# Patient Record
Sex: Male | Born: 1950
Health system: Southern US, Community
[De-identification: ages and names within clinical notes are randomized; demographics above are authoritative.]

## PROBLEM LIST (undated history)

## (undated) DIAGNOSIS — F22 Delusional disorders: Secondary | ICD-10-CM

## (undated) DIAGNOSIS — G4700 Insomnia, unspecified: Secondary | ICD-10-CM

## (undated) DIAGNOSIS — Z860100 Personal history of colon polyps, unspecified: Secondary | ICD-10-CM

## (undated) DIAGNOSIS — E119 Type 2 diabetes mellitus without complications: Secondary | ICD-10-CM

## (undated) DIAGNOSIS — S0990XA Unspecified injury of head, initial encounter: Secondary | ICD-10-CM

## (undated) DIAGNOSIS — I739 Peripheral vascular disease, unspecified: Secondary | ICD-10-CM

## (undated) DIAGNOSIS — Z8619 Personal history of other infectious and parasitic diseases: Secondary | ICD-10-CM

## (undated) DIAGNOSIS — H539 Unspecified visual disturbance: Secondary | ICD-10-CM

## (undated) DIAGNOSIS — E785 Hyperlipidemia, unspecified: Secondary | ICD-10-CM

## (undated) DIAGNOSIS — I779 Disorder of arteries and arterioles, unspecified: Secondary | ICD-10-CM

## (undated) DIAGNOSIS — R4189 Other symptoms and signs involving cognitive functions and awareness: Secondary | ICD-10-CM

## (undated) DIAGNOSIS — I10 Essential (primary) hypertension: Secondary | ICD-10-CM

## (undated) DIAGNOSIS — M47812 Spondylosis without myelopathy or radiculopathy, cervical region: Secondary | ICD-10-CM

## (undated) DIAGNOSIS — Z8673 Personal history of transient ischemic attack (TIA), and cerebral infarction without residual deficits: Secondary | ICD-10-CM

## (undated) DIAGNOSIS — I639 Cerebral infarction, unspecified: Secondary | ICD-10-CM

## (undated) DIAGNOSIS — Z87898 Personal history of other specified conditions: Secondary | ICD-10-CM

## (undated) DIAGNOSIS — F1910 Other psychoactive substance abuse, uncomplicated: Secondary | ICD-10-CM

## (undated) HISTORY — DX: Insomnia, unspecified: G47.00

## (undated) HISTORY — DX: Unspecified visual disturbance: H53.9

## (undated) HISTORY — DX: Disorder of arteries and arterioles, unspecified: I77.9

## (undated) HISTORY — DX: Spondylosis without myelopathy or radiculopathy, cervical region: M47.812

## (undated) HISTORY — PX: OTHER SURGICAL HISTORY: SHX169

## (undated) HISTORY — DX: Unspecified injury of head, initial encounter: S09.90XA

## (undated) HISTORY — DX: Other symptoms and signs involving cognitive functions and awareness: R41.89

## (undated) HISTORY — PX: FRACTURE SURGERY: SHX138

## (undated) HISTORY — DX: Essential (primary) hypertension: I10

## (undated) HISTORY — DX: Type 2 diabetes mellitus without complications: E11.9

## (undated) HISTORY — DX: Peripheral vascular disease, unspecified: I73.9

## (undated) HISTORY — DX: Hyperlipidemia, unspecified: E78.5

## (undated) HISTORY — DX: Personal history of other specified conditions: Z87.898

## (undated) HISTORY — PX: VASCULAR SURGERY: SHX849

## (undated) HISTORY — DX: Personal history of transient ischemic attack (TIA), and cerebral infarction without residual deficits: Z86.73

## (undated) HISTORY — DX: Personal history of colonic polyps: Z86.010

## (undated) HISTORY — DX: Other psychoactive substance abuse, uncomplicated: F19.10

## (undated) HISTORY — DX: Personal history of colon polyps, unspecified: Z86.0100

## (undated) HISTORY — DX: Delusional disorders: F22

---

## 1994-05-26 DIAGNOSIS — S0990XA Unspecified injury of head, initial encounter: Secondary | ICD-10-CM

## 1994-05-26 HISTORY — DX: Unspecified injury of head, initial encounter: S09.90XA

## 1999-04-29 ENCOUNTER — Encounter: Payer: Self-pay | Admitting: Neurological Surgery

## 1999-04-29 ENCOUNTER — Ambulatory Visit (HOSPITAL_COMMUNITY): Admission: RE | Admit: 1999-04-29 | Discharge: 1999-04-29 | Payer: Self-pay | Admitting: Neurological Surgery

## 2002-05-02 ENCOUNTER — Emergency Department (HOSPITAL_COMMUNITY): Admission: EM | Admit: 2002-05-02 | Discharge: 2002-05-02 | Payer: Self-pay | Admitting: Emergency Medicine

## 2002-05-02 ENCOUNTER — Encounter: Payer: Self-pay | Admitting: Emergency Medicine

## 2002-05-03 ENCOUNTER — Inpatient Hospital Stay (HOSPITAL_COMMUNITY): Admission: EM | Admit: 2002-05-03 | Discharge: 2002-05-06 | Payer: Self-pay | Admitting: Emergency Medicine

## 2002-05-03 ENCOUNTER — Encounter: Payer: Self-pay | Admitting: Internal Medicine

## 2002-05-04 ENCOUNTER — Encounter: Payer: Self-pay | Admitting: Gastroenterology

## 2002-05-04 ENCOUNTER — Encounter (INDEPENDENT_AMBULATORY_CARE_PROVIDER_SITE_OTHER): Payer: Self-pay | Admitting: Specialist

## 2004-06-20 ENCOUNTER — Emergency Department (HOSPITAL_COMMUNITY): Admission: EM | Admit: 2004-06-20 | Discharge: 2004-06-20 | Payer: Self-pay | Admitting: Emergency Medicine

## 2004-06-27 ENCOUNTER — Ambulatory Visit: Payer: Self-pay | Admitting: Internal Medicine

## 2004-07-05 ENCOUNTER — Ambulatory Visit (HOSPITAL_COMMUNITY): Admission: RE | Admit: 2004-07-05 | Discharge: 2004-07-05 | Payer: Self-pay | Admitting: Anesthesiology

## 2004-08-08 ENCOUNTER — Ambulatory Visit: Payer: Self-pay | Admitting: Internal Medicine

## 2004-08-15 ENCOUNTER — Ambulatory Visit: Payer: Self-pay | Admitting: Internal Medicine

## 2004-08-22 ENCOUNTER — Ambulatory Visit: Payer: Self-pay | Admitting: Internal Medicine

## 2004-09-25 ENCOUNTER — Ambulatory Visit: Payer: Self-pay | Admitting: Internal Medicine

## 2004-10-09 ENCOUNTER — Ambulatory Visit (HOSPITAL_COMMUNITY): Admission: RE | Admit: 2004-10-09 | Discharge: 2004-10-09 | Payer: Self-pay | Admitting: Internal Medicine

## 2004-10-10 ENCOUNTER — Ambulatory Visit: Payer: Self-pay | Admitting: Internal Medicine

## 2004-10-28 ENCOUNTER — Ambulatory Visit: Payer: Self-pay | Admitting: Internal Medicine

## 2004-10-28 ENCOUNTER — Inpatient Hospital Stay (HOSPITAL_COMMUNITY): Admission: AD | Admit: 2004-10-28 | Discharge: 2004-11-02 | Payer: Self-pay | Admitting: Internal Medicine

## 2004-10-31 ENCOUNTER — Encounter (INDEPENDENT_AMBULATORY_CARE_PROVIDER_SITE_OTHER): Payer: Self-pay | Admitting: *Deleted

## 2004-11-01 ENCOUNTER — Encounter (INDEPENDENT_AMBULATORY_CARE_PROVIDER_SITE_OTHER): Payer: Self-pay | Admitting: Specialist

## 2004-11-04 ENCOUNTER — Ambulatory Visit: Payer: Self-pay | Admitting: Internal Medicine

## 2004-11-11 ENCOUNTER — Ambulatory Visit: Payer: Self-pay | Admitting: Internal Medicine

## 2004-11-19 ENCOUNTER — Ambulatory Visit: Payer: Self-pay | Admitting: Internal Medicine

## 2005-03-04 ENCOUNTER — Ambulatory Visit: Payer: Self-pay | Admitting: Hospitalist

## 2005-03-04 DIAGNOSIS — Z9889 Other specified postprocedural states: Secondary | ICD-10-CM | POA: Insufficient documentation

## 2005-04-22 ENCOUNTER — Ambulatory Visit: Payer: Self-pay | Admitting: Internal Medicine

## 2005-04-22 ENCOUNTER — Encounter (INDEPENDENT_AMBULATORY_CARE_PROVIDER_SITE_OTHER): Payer: Self-pay | Admitting: *Deleted

## 2005-12-26 ENCOUNTER — Encounter (INDEPENDENT_AMBULATORY_CARE_PROVIDER_SITE_OTHER): Payer: Self-pay | Admitting: *Deleted

## 2005-12-26 ENCOUNTER — Ambulatory Visit (HOSPITAL_COMMUNITY): Admission: RE | Admit: 2005-12-26 | Discharge: 2005-12-26 | Payer: Self-pay | Admitting: Gastroenterology

## 2006-03-24 ENCOUNTER — Encounter (INDEPENDENT_AMBULATORY_CARE_PROVIDER_SITE_OTHER): Payer: Self-pay | Admitting: *Deleted

## 2006-03-24 DIAGNOSIS — E782 Mixed hyperlipidemia: Secondary | ICD-10-CM | POA: Insufficient documentation

## 2006-03-24 DIAGNOSIS — E1165 Type 2 diabetes mellitus with hyperglycemia: Secondary | ICD-10-CM

## 2006-03-24 DIAGNOSIS — I10 Essential (primary) hypertension: Secondary | ICD-10-CM | POA: Insufficient documentation

## 2006-03-24 DIAGNOSIS — E1149 Type 2 diabetes mellitus with other diabetic neurological complication: Secondary | ICD-10-CM

## 2006-03-24 DIAGNOSIS — IMO0002 Reserved for concepts with insufficient information to code with codable children: Secondary | ICD-10-CM | POA: Insufficient documentation

## 2006-03-26 DIAGNOSIS — R519 Headache, unspecified: Secondary | ICD-10-CM | POA: Insufficient documentation

## 2006-03-26 DIAGNOSIS — R51 Headache: Secondary | ICD-10-CM

## 2006-03-26 DIAGNOSIS — F17200 Nicotine dependence, unspecified, uncomplicated: Secondary | ICD-10-CM | POA: Insufficient documentation

## 2008-10-29 ENCOUNTER — Emergency Department (HOSPITAL_COMMUNITY): Admission: EM | Admit: 2008-10-29 | Discharge: 2008-10-29 | Payer: Self-pay | Admitting: Emergency Medicine
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
## 2010-10-11 NOTE — Consult Note (Signed)
NAMEMARCK, MCCLENNY NO.:  [id number]   MEDICAL RECORD NO.:  [id number]          PATIENT TYPE:  INP   LOCATION:  5705                         FACILITY:  [facility]   PHYSICIAN:  Jill Alexanders, M.D.  DATE OF BIRTH:  18-Sep-1950   DATE OF CONSULTATION:  10/30/2004  DATE OF DISCHARGE:                                   CONSULTATION   CONSULTING PHYSICIAN:  Jill Alexanders, M.D.   HISTORY OF PRESENT ILLNESS:  Ryan Butler is a 60 year old white male,  born 14-Aug-1950, with a history of episodes of right arm numbness and  weakness that have occurred over the last three months.  This patient has  had a total of eight such episodes.  The patient describes a tingling  sensation occurring on the lower face on the right, right arm, shoulder,  some tingling in the right leg.  The patient gets severe weakness of the  right arm with floppiness, inability to use the arm at all.  No true  weakness of the right leg is noted.  The patient may have episodes that last  a little over an hour but they always resolve.  The patient has a history of  bitemporal headaches but does not relate the headaches to the above events.  The patient is having a lot of neck pain and shoulder pain associated with  cervical spondylosis.  The patient reports some lightheaded sensations with  flexion, extension, rotation of the neck as well.  As part of his workup,  the patient has been noted to have left internal carotid artery stenosis of  80% by Doppler and MR angiogram has revealed an area of alteration in  laminar flow in the right cavernous carotid artery, may be significant  stenosis in this area as well.  The patient has not sustained a stroke event  by MRI of the brain.  The patient is not clear whether there is any speech  alteration during the episodes, has blurring of vision, no loss of vision  off to one side or the other.  Neurology is asked to see this patient for  further  evaluation.   PAST MEDICAL HISTORY:  1.  History of hypertension.  2.  History of closed head injury in 1996.  3.  Polysubstance abuse including cocaine.  4.  History of cervical spondylosis with neck, shoulder pain.   The patient has a 45-pack year history of smoking, history of cocaine IV  drug abuse, alcohol abuse in the past.   ALLERGIES:  1.  MORPHINE.  2.  PHENERGAN.   MEDICATIONS:  1.  Aspirin 325 mg a day.  2.  Flexeril 5 mg three times a day.  3.  Sliding scale insulin.  4.  Lantus insulin 7 units daily for diabetes.  5.  Lopressor 50 mg b.i.d.  6.  Maxzide one tablet daily.  7.  Tylox as needed.   SOCIAL HISTORY:  The patient has been unemployed for the majority of the  last ten years.  The patient has not had regular medical followup.   FAMILY  MEDICAL HISTORY:  Not obtained.   REVIEW OF SYSTEMS:  Notable for no recent fever, chills.  The patient does  note bitemporal headaches, does note blurring of vision with flexion  extension of the neck.  Denies significant shortness of breath, chest pain,  nausea, vomiting.  The patient has some slight gait unsteadiness with  associated dizziness.   PHYSICAL EXAMINATION:  VITAL SIGNS:  Blood pressure is 152/96, heart rate is  72, respiratory rate is 18, temperature afebrile.  GENERAL:  This patient is a fairly well developed white male who is alert  and cooperative at the time of examination.  HEENT:  Head is atraumatic.  Eyes:  Pupils are equal, round, react to light.  Disks are flat bilaterally.  NECK:  Supple.  No carotid bruits noted.  RESPIRATORY:  Clear.  CARDIOVASCULAR:  Reveals a regular rate and rhythm.  No obvious murmurs rubs  noted.  EXTREMITIES:  Without significant edema.  NEUROLOGIC:  Cranial nerves as above.  Facial symmetry is present.  The  patient has good sensation to facial pinprick, soft touch bilaterally.  He  has good strength of the facial muscles and the muscles to head turn and  shoulder  shrug bilaterally.  Speech is well enunciated, not aphasic.  Extraocular movements again are full.  Visual fields are full.  Motor  testing reveals good strength in all four extremities.  Good symmetric motor  tone is noted throughout.  Sensory testing reveals some decrease in pinprick  sensation on the right arm, greater than the left, particularly in the ulnar  distribution of the right hand and forearm.  Decreased pinprick sensation  below the knee on the left leg as compared to the right.  __________  sensation depressed on the left leg as compared to the right, more symmetric  in the arms.  The patient has good finger-to-nose-finger, heel-to-shin  bilaterally.  The patient is able to ambulate normally.  A slight  unsteadiness with tandem gait.  Romberg negative.  No pronator drift is  seen.  The patient has symmetric reflexes.  Depression of ankle jerk  reflexes.  Toes are neutral bilaterally.   LABORATORY VALUES:  Notable for a white count of 10.3, hemoglobin of 14.5,  hematocrit of 41.5, MCV of 91.2, platelets of 257.  Sodium 137, potassium  3.0, chloride of 98, CO2 of 30, glucose of 273, BUN of 15, creatinine 0.9.  Magnesium 1.8.  Hemoglobin A1c of 9.5.  Troponin I of 0.01.  CK of 25.  Cholesterol panel reveals a cholesterol of 200, triglycerides of 96, HDL of  39, LDL of 142, VLDL of 19.   MRI scans are as above.   IMPRESSION:  1.  Multiple transient ischemic attack events involving the right face, arm      predominantly.  2.  Left internal carotid artery stenosis.  3.  Right cavernous carotid artery abnormality, possible stenosis.  4.  Diabetes.  5.  Polysubstance abuse.   This patient certainly has risk factors for cerebrovascular disease.  The  patient is currently on aspirin.  The patient has had multiple events of  what appear to be significant TIA episodes with severe weakness of the right arm.  The patient does have an 80% blockage of the left internal carotid   artery __________  a left brain TIA.   RECOMMENDATIONS:  1.  Pursue left carotid endarterectomy as this appears to be a symptomatic      carotid.  2.  If a 2D echocardiogram  has not been performed, this should be done at      some point.  3.  A CT angiogram or cerebral angiogram may be considered to further      delineate the right cavernous carotid      artery, prior to endarterectomy.  4.  We will follow the patient's clinical course while in-house.   Thank you very much.       CKW/MEDQ  D:  10/30/2004  T:  10/31/2004  Job:  [id number]   cc:   Jay Schlichter, M.D.  [address], [place] [address]  Fax: [phone]   [facility]  [address]  [address]

## 2010-10-11 NOTE — Procedures (Signed)
   NAME:  Ryan Butler, Ryan Butler                          ACCOUNT NO.:  [id number]   MEDICAL RECORD NO.:  [id number]                   PATIENT TYPE:  EMS   LOCATION:  ED                                   FACILITY:  [facility]   PHYSICIAN:  Edward L. Luan Pulling, M.D.             DATE OF BIRTH:  03-24-1951   DATE OF PROCEDURE:  05/02/2002  DATE OF DISCHARGE:  05/02/2002                                EKG INTERPRETATION   The rhythm is a sinus rhythm with a rate in the 70s.  The axis is leftward.  T-wave inversions seen inferiorly and clinical correlation is suggested.                                               Edward L. Luan Pulling, M.D.    ELH/MEDQ  D:  05/02/2002  T:  05/02/2002  Job:  [id number]

## 2010-10-11 NOTE — Op Note (Signed)
[name], [name]                ACCOUNT NO.:  [id number]   MEDICAL RECORD NO.:  [id number]          PATIENT TYPE:  AMB   LOCATION:  ENDO                         FACILITY:  [facility]   PHYSICIAN:  John C. Amedeo Plenty, M.D.    DATE OF BIRTH:  Oct 10, 1950   DATE OF PROCEDURE:  12/26/2005  DATE OF DISCHARGE:                                 OPERATIVE REPORT   INDICATIONS FOR PROCEDURE:  History of adenomatous colon polyps 3 years ago.   PROCEDURE:  The patient was placed in the left lateral decubitus position  and placed on the pulse monitor with continuous low-flow oxygen delivered by  nasal cannula.  He was sedated with 100 mcg IV fentanyl and 10 mg IV Versed  as well as 25 mg IV Phenergan.  The Olympus video colonoscope was inserted  into the rectum and advanced to cecum, confirmed by transillumination of  McBurney's point and visualization of ileocecal valve and appendiceal  orifice.  Prep was quite suboptimal and poor in many places and only large  mass lesions could be ruled out in most areas.  To an extent, visualized  cecum, ascending, and transverse colon appeared normal with no masses,  polyps, diverticula or other mucosal abnormalities.  Within the descending  colon, there was an 8-mm sessile polyp removed by snare.  The remainder of  the descending. sigmoid and rectum appeared normal with the exceptions of  limited prep as mentioned above.  Scope has been withdrawn and the patient  returned to the recovery room in stable condition.  He tolerated the  procedure well, and there were no immediate complications.   IMPRESSION:  1.  Descending colon polyp.  2.  Very poor prep.   PLAN:  Will await histology and probably repeat study in 2-3 years given the  poor prep.           ______________________________  Elyse Jarvis. Amedeo Plenty, M.D.     JCH/MEDQ  D:  12/26/2005  T:  12/26/2005  Job:  [id number]   cc:   Jana Hakim, M.D.
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
## 2010-10-11 NOTE — Discharge Summary (Signed)
NAMEDAMARIA, STOFKO NO.:  [id number]   MEDICAL RECORD NO.:  [id number]          PATIENT TYPE:  OUT   LOCATION:  EKG                          FACILITY:  [facility]   PHYSICIAN:  Judeth Cornfield. Scot Dock, M.D.DATE OF BIRTH:  Sep 28, 1962   DATE OF ADMISSION:  10/28/2004  DATE OF DISCHARGE:  11/02/2004                                 DISCHARGE SUMMARY   PRIMARY DIAGNOSIS:  Symptomatic left carotid stenosis.   SECONDARY DIAGNOSES:  1.  Diabetes mellitus.  2.  Hypertension.  3.  History of seizures.  4.  Status post repair of tibia and fibula fracture.   IN HOSPITAL OPERATIONS AND PROCEDURES:  Left carotid endarterectomy with  Dacron patch angioplasty.   HISTORY AND PHYSICAL AND HOSPITAL COURSE:  Mr. Aston is a 60 year old  gentleman who was admitted to [facility] on October 28, 2004 with multiple  issues including leg weakness and back pain, blurred vision, syncopal  episodes over the last 7-8 months and intermittent right arm weakness.  As  part of his workup he had a carotid Duplex scan which showed a greater than  80% left carotid stenosis with no significant extracranial carotid disease  on the right side noted on Duplex.  Both vertebral arteries are patent with  normal directed flow.  The patient has also had an MRA showing a significant  stenosis in the right cavernous carotid artery.  The patient complains of  multiple episodes in the last 3-4 months in which he experienced complete  flaccidness in the right arm and paresthesias which usually lasted  approximately an hour.  The patient denied any history of expressive or  receptive aphasia or amaurosis fugax.  Following results of the Duplex  ultrasound, Dr. Scot Dock was consulted.  The patient was seen and evaluated  by Dr. Scot Dock.  Dr. Scot Dock discussed with patient undergoing left carotid  endarterectomy.  He discussed the risks and benefits of this procedure.  The  patient acknowledged understanding and agreed  to proceed.  Surgery was  scheduled for November 01, 2004.  The patient was taken to the operating room on  November 01, 2004 where he underwent left carotid endarterectomy with Dacron  patch angioplasty. The patient tolerated this procedure well and transferred  up to the intensive care unit in stable condition.  Following surgery the  patient seemed to be alert and oriented x3.  Neurologic seemed to be intact.   On postoperative day #1 the patient was out-of-bed ambulating well.  Neurologic remained intact.  The patient seemed to be hemodynamically  stable.  He was afebrile and vital signs stable.  He was saturating 94% on  room air.  Incisions dry and intact and healing well.  CBGs are monitored  postoperatively and were slightly elevated  Diabetes was managed by Dr.  Levada Dy.  The patient remained in normal sinus rhythm following surgery.   The patient was discharged to home on postoperative day #1 on November 02, 2004  in stable condition.  A followup appointment will be arranged with Dr.  Scot Dock in 3 weeks.  Our office will contact patient  with this information.  The patient is to followup with Dr. Levada Dy at her in the next 2-3 weeks.  The patient will need to contact Dr. Jay Schlichter' office to schedule a  followup appointment.  Mr. Alcindor received instructions on diet and  incisional care.  He was told no driving until released to do so.  No heavy  lifting over 10 pounds.  The patient was told he was allowed to shower,  washing his incision using soap and water. He is to contact the office if he  develops injury or opening of any of his incisions.  He is to ambulate 3-4  times per day, progress as tolerated and to continue his breathing  exercises.  The patient was educated on diet to be low fat, low salt, as  well as diabetic diet.   DISCHARGE MEDICATIONS:  1.  Metoprolol 50 mg b.i.d.  2.  Diclofenac 35 mg b.i.d.  3.  Metformin 500 mg b.i.d.  4.  Flexeril 10 mg t.i.d.  5.  Percocet  5/325 q.4-6 h. p.r.n. pain  6.  Zocor 80 mg q.h.s.  7.  Aspirin 325 mg daily.  8.  Potassium chloride 20 mEq daily.  9.  Hydrochlorothiazide 25 mg daily.      Darlin Coco, PA      Judeth Cornfield. Scot Dock, M.D.  Electronically Signed    KMD/MEDQ  D:  07/14/2005  T:  07/15/2005  Job:  [id number]   cc:   Judeth Cornfield. Scot Dock, M.D.  [address]  [place]  [place] [address]   Jay Schlichter, M.D.  Fax: [phone]   Fabio Asa, M.D.  Fax: [phone]

## 2010-10-11 NOTE — Consult Note (Signed)
Ryan Butler, KETTLES NO.:  [id number]   MEDICAL RECORD NO.:  [id number]          PATIENT TYPE:  INP   LOCATION:  5705                         FACILITY:  [facility]   PHYSICIAN:  Eustace Moore, MD     DATE OF BIRTH:  [birth year]   DATE OF CONSULTATION:  10/29/2004  DATE OF DISCHARGE:                                   CONSULTATION   CHIEF COMPLAINT:  Cervical spondylosis.   HISTORY OF PRESENT ILLNESS:  This is a 60 year old white male admitted to  [facility] with apparent syncopal episode.  He was found to have  cervical spondylosis on MRI and neurosurgical consultation was requested.  He was also found to have 80% carotid stenosis by ultrasound earlier today.  He complains of numbness and tingling to the right arm with occasional  floppiness of the right arm.  It seems that he has disuse of the arm from  time to time.  He complains of tingling along the face and into the chest.  Also, this involves just the right side of the face.  He complains of some  electric shock like sensations into the head and down the back with certain  neck movements.  He complains of numbness and tingling in the hands stating  he has dropped objects and has clumsiness in his hands.  He denies any  change of gait or bowel and bladder changes.  He states he has had nerve  conduction studies in the past consistent with carpal tunnel syndrome.  He  complains of some visual blurring but no episodes that sound like amaurosis  fugax.  He complains of some arm pain down the arms from time to time.  He  states he was evaluated at [facility] and was told that  he would be sent for possible neck surgery by the physicians there.  He was  admitted here yesterday for workup.   PAST MEDICAL HISTORY:  Hypertension, multiple traumas, history of seizures  at age 39, questionable alcohol withdrawal, poly-substance abuse, and  chronic pain.   MEDICATIONS ON ADMISSION:   Hydrochlorothiazide, Diclofenac, Metoprolol, and  Flexeril.   SUBSTANCE HISTORY:  He currently smokes about 1 1/2 packs per day over the  last 30 years.  He does report using in the past cocaine, IV drugs, and  alcohol.  He is single.   FAMILY HISTORY:  Noncontributory.   PHYSICAL EXAMINATION:  VITAL SIGNS:  He is afebrile, respirations 20, pulse 75 and regular.  GENERAL:  Pleasant, cooperative white male in no acute distress.  HEENT:  Normocephalic, atraumatic, extraocular movements intact, his visual  fields are full to confrontation.  NECK:  Supple with fairly full range of motion, negative Spurling sign.  HEART:  Regular rate and rhythm.  EXTREMITIES:  No cyanosis, clubbing, and edema.  NEUROLOGICAL:  He is awake and alert.  He is oriented x 4.  He has no  aphagia, good attention span, good fund of knowledge, good memory.  No focal  facial asymmetry.  Good facial function.  His tongue protrudes in the  midline.  He can puff out his cheeks.  He has good shoulder shrug.  His  strength is good in his upper extremities except for hand grips that are  about 4+ out of 5.  The remainder of his strength in his upper extremities  is 5/5.  There is some mention in the chart that he had 4/5 right deltoid  strength to another physicians exam, but my exam today shows equal strength  in the deltoids.  He can lift his arms above his head.  He has good  sensation.  He does have a strongly positive Tinel's sign over his right  ulnar groove and his carpal tunnel.  There is no atrophy in the hand.  He  has a positive Fromet's sign on the right side.  He has a negative Hoffman's  sign.  His gait is not spastic.  He is not hyper-reflexic.  I do not hear  carotid bruits.   IMAGING STUDIES:  MRI of the cervical spine I have reviewed as well as the  report.  He has multi-level cervical spondylosis.  He has spondylolisthesis  of C3 on C4.  He has foraminal stenosis especially on the right at C4-C5.  He  has some canal stenosis at C6-C7 but more to the left.   ASSESSMENT/PLAN:  This is a 60 year old white male with multiple problems.  He has left carotid artery stenosis by ultrasound measuring 80%.  I cannot,  with any confidence, say that his episodic right facial tingling and arm  tingling and occasional floppiness of the right arm are not TIAs related  to this carotid artery stenosis.  He should be started on aspirin and  possibly Plavix, also.  He needs further workup with an MRI/MRA of the brain  and possibly a four vessel cerebral arteriogram.  He will likely need a  carotid endarterectomy on the left side should these tests confirm severe  stenosis of the left carotid artery.  This may be symptomatic.  Also, I  suspect he has a right ulnar neuropathy and right carpal tunnel syndrome as  the cause of his numbness and tingling in his hand and he even describes it  as a funny bone feeling in his arm all the time.  He had strongly positive  Tinel's sign over both the carpal tunnel and the ulnar groove on the right  side and he has a positive Fromet's sign on the right side.  I think he  should be worked up with nerve conduction studies of the upper extremities  to help evaluate this.  He also has significant cervical spondylosis with  some evidence of anterior listhesis of C3 on C4, some mild canal stenosis,  but no signal change in his spinal cord and he does have significant  foraminal stenosis at C4-C5 on the right and C6-C7 on the left.  Certainly,  I think this will need addressing at sometime in the future, but I think his  carotid stenosis takes precedence over this at this point, especially given  the fact that we do not know whether it is symptomatic or not.  Most of this  can be done as an outpatient after being started on aspirin.  Also, I  mentioned the residents neurosurgical clinic at [facility] as a way for him to save money since he has no insurance, he has Medicaid  pending so  he tells me and he is also working on disability.  The neurosurgical  resident clinic at [facility] could handle all  three of these issues, the  carotid stenosis, the cervical spondylosis, and the peripheral neuropathy  through their clinic.  I do want to thank you for this interesting consult  and I will follow along while he is in the hospital here and will be happy  to see him in follow up should he and Dr. Marinda Elk prefer that over follow up  at [facility].       DSJ/MEDQ  D:  10/29/2004  T:  10/29/2004  Job:  [id number]

## 2010-10-11 NOTE — Op Note (Signed)
   NAME:  Ryan Butler, Ryan Butler                          ACCOUNT NO.:  [id number]   MEDICAL RECORD NO.:  [id number]                   PATIENT TYPE:  INP   LOCATION:  [place]                                 FACILITY:  [facility]   PHYSICIAN:  John C. Amedeo Plenty, M.D.                 DATE OF BIRTH:  20-Aug-1950   DATE OF PROCEDURE:  05/03/2002  DATE OF DISCHARGE:                                 OPERATIVE REPORT   PROCEDURE PERFORMED:  Esophagogastroduodenoscopy with biopsy.   ENDOSCOPIST:  Elyse Jarvis. Amedeo Plenty, M.D.   INDICATIONS FOR PROCEDURE:  Heme positive stool and abdominal pain.   DESCRIPTION OF PROCEDURE:  The patient was placed in the left lateral  decubitus position and placed on the pulse monitor with continuous low-flow  oxygen delivered by nasal cannula.  He was sedated with 70 mg IV Demerol and  7 mg IV Versed.  The Olympus video endoscope was advanced under direct  vision into the oropharynx and esophagus.  The esophagus was straight and of  normal with the squamocolumnar line at 38 cm.  There was no visible  hiatal  hernia, ring, stricture or other abnormality of the GE junction.  The  stomach was entered and a small amount of liquid secretions were suctioned  from the fundus.  Retroflex view of the cardia was unremarkable.  The fundus  and body appeared normal.  The antrum showed granularity and streaks of  erythema in a radial fashion projecting proximal to the pylorus with no  focal erosions or ulcers.  A CLO test was obtained.  The pylorus was non  deformed and easily allowed passage of the endoscope deep into the duodenum.  Both the bulb and second portion were well inspected and appeared to be  within normal limits.  The scope was then withdrawn and the patient returned  to the recovery room in stable condition.  The patient tolerated the  procedure well.  There were no immediate complications.   IMPRESSION:  Moderate diffuse antral gastritis otherwise normal study.   PLAN:  1. Await CLO  test and treat for eradication of Helicobacter pylori if     positive.  2. Ultrasound of the liver, gallbladder and pancreas and evaluation of his     pain.  3. Will eventually need colonoscopy.                                               John C. Amedeo Plenty, M.D.    JCH/MEDQ  D:  05/03/2002  T:  05/03/2002  Job:  [id number]   cc:   Deborah Chalk, M.D.  [address], [address]  [place]  [id number]  Fax: [phone]

## 2010-10-11 NOTE — Op Note (Signed)
Ryan Butler, MARTIN NO.:  [id number]   MEDICAL RECORD NO.:  [id number]          PATIENT TYPE:  INP   LOCATION:  5799                         FACILITY:  [facility]   PHYSICIAN:  Judeth Cornfield. Scot Dock, M.D.DATE OF BIRTH:  20-Dec-1950   DATE OF PROCEDURE:  11/01/2004  DATE OF DISCHARGE:                                 OPERATIVE REPORT   PREOPERATIVE DIAGNOSIS:  Symptomatic left carotid stenosis.   POSTOPERATIVE DIAGNOSIS:  Symptomatic left carotid stenosis.   PROCEDURE:  Left carotid endarterectomy with Dacron patch angioplasty.   SURGEON:  Judeth Cornfield. Scot Dock, M.D.   ASSISTANT:  Tanya Nones, RNFA.   ANESTHESIA:  General.   INDICATIONS:  This is a 60 year old gentleman who was admitted with multiple  medical problems.  On questioning, he admitted to approximately 8 episodes  of transient right arm paralysis and paresthesias over the last 2-3 months.  A carotid duplex scan showed a tight left carotid stenosis and also this was  confirmed by MRA.  Left carotid endarterectomy was recommended in order to  lower his risk of future stroke.  The procedure and potential complications  were discussed with the patient preoperatively, all of his questions were  answered and he was agreeable to proceed.   TECHNIQUE:  The patient was taken to the operating room where he received a  general anesthetic.  The neck and upper chest on the left were prepped and  draped in the usual sterile fashion.  An incision was made along the  anterior border of the sternocleidomastoid and the dissection carried down  to the common carotid artery, which was dissected free and controlled with a  Rumel tourniquet.  The facial vein was divided 2-0 silk ties and then the  internal carotid artery was controlled above the plaque.  Of note, the  bifurcation was fairly high.  The external carotid artery was controlled.  The patient was heparinized.  Clamps were then placed on the internal, then  the  common, then the external carotid artery.  A longitudinal arteriotomy  was made in the common carotid artery and this was extended through the  plaque into the internal carotid artery.  A 12 shunt was placed into the  internal carotid artery, back-bled and then placed into the common carotid  artery and secured with Rumel tourniquet.  Flow was reestablished through  the shunt.  Of note, there was not very brisk back-bleeding and the patient  has a known stenosis in the right cavernous portion of the ICA.  An  endarterectomy plane was established proximally and the plaque was sharply  divided.  Eversion endarterectomy was performed of the external carotid  artery; distally, there was a nice taper in the plaque and no tacking  sutures were required.  The artery was irrigated with copious amounts of  dextran and heparin, and all loose debris removed.  The Dacron patch was  then sewn using continuous 6-0 Prolene suture.  Prior to completing the  patch closure, the shunt was removed, the artery back-bled and flushed  appropriately, and the anastomosis completed.  Flow was reestablished first  to the external carotid artery and then to the internal carotid artery.  At  the completion, there was a good Doppler signal distal to the patch and a  good pulse.  Hemostasis was obtained in the wound and the heparin was  partially reversed with protamine.  The wound was closed with a deep  layer of 3-0 Vicryl.  The platysma was closed with running 3-0 Vicryl.  The  skin was closed with a 4-0 subcuticular stitch.  A sterile dressing was  applied.  The patient tolerated the procedure well and was transferred to  the recovery room in satisfactory condition.  All needle and sponge counts  were correct.       CSD/MEDQ  D:  11/01/2004  T:  11/01/2004  Job:  [id number]

## 2010-10-11 NOTE — Op Note (Signed)
   NAME:  Ryan Butler, Ryan Butler                          ACCOUNT NO.:  [id number]   MEDICAL RECORD NO.:  [id number]                   PATIENT TYPE:  INP   LOCATION:  5732                                 FACILITY:  [facility]   PHYSICIAN:  John C. Amedeo Plenty, M.D.                 DATE OF BIRTH:  [birth year]   DATE OF PROCEDURE:  05/04/2002  DATE OF DISCHARGE:                                 OPERATIVE REPORT   INDICATIONS FOR PROCEDURE:  Abdominal pain and hemepositive stool with EGD  yesterday nondiagnostic.   PROCEDURE:  The patient was placed in the left lateral decubitus position  and placed on the pulse monitor with continuous low-flow oxygen delivered by  nasal cannula. He was sedated with 70 mg of IV Demerol and 7 mg of IV  Versed.  The Olympus video colonoscope was inserted into the rectum and  advanced to the cecum, confirmed by transillumination of the McBurney's  point and visualization of the ileocecal valve and appendiceal orifice.  The  prep was excellent.  The cecum revealed an 8 mm sessile polyp, which was  removed by snare.  The ascending and transverse colon appeared normal with  no masses, polyps, diverticula, or other mucosal abnormalities.  In the  descending colon, there was an 8 mm sessile polyp removed by snare.  The  remainder of the descending, sigmoid, and rectum appeared normal down to the  anus where a retroflexed view revealed no obvious internal hemorrhoids.  The  colonoscope was then withdrawn, and the patient returned to the recovery  room in stable condition.  He tolerated the procedure well, and there were  no immediate complications.   IMPRESSION:  Two colon polyps.   PLAN:  Await histology to determine method and intervals for future colon  screening.                                               John C. Amedeo Plenty, M.D.    JCH/MEDQ  D:  05/04/2002  T:  05/04/2002  Job:  [id number]   cc:   Jana Hakim, M.D.

## 2010-12-18 ENCOUNTER — Encounter: Payer: Medicare Other | Admitting: Physical Medicine & Rehabilitation

## 2011-02-03 ENCOUNTER — Encounter: Payer: Medicare Other | Attending: Physical Medicine & Rehabilitation | Admitting: Physical Medicine & Rehabilitation

## 2011-02-03 DIAGNOSIS — M545 Low back pain, unspecified: Secondary | ICD-10-CM | POA: Insufficient documentation

## 2011-02-03 DIAGNOSIS — G8929 Other chronic pain: Secondary | ICD-10-CM | POA: Insufficient documentation

## 2011-02-03 DIAGNOSIS — M47817 Spondylosis without myelopathy or radiculopathy, lumbosacral region: Secondary | ICD-10-CM

## 2011-02-03 DIAGNOSIS — M7530 Calcific tendinitis of unspecified shoulder: Secondary | ICD-10-CM

## 2011-02-03 DIAGNOSIS — E119 Type 2 diabetes mellitus without complications: Secondary | ICD-10-CM | POA: Insufficient documentation

## 2011-02-03 DIAGNOSIS — M542 Cervicalgia: Secondary | ICD-10-CM | POA: Insufficient documentation

## 2011-02-03 DIAGNOSIS — IMO0002 Reserved for concepts with insufficient information to code with codable children: Secondary | ICD-10-CM | POA: Insufficient documentation

## 2011-02-03 DIAGNOSIS — M47812 Spondylosis without myelopathy or radiculopathy, cervical region: Secondary | ICD-10-CM

## 2011-02-03 DIAGNOSIS — Z79899 Other long term (current) drug therapy: Secondary | ICD-10-CM | POA: Insufficient documentation

## 2011-02-03 DIAGNOSIS — M62838 Other muscle spasm: Secondary | ICD-10-CM | POA: Insufficient documentation

## 2011-02-19 ENCOUNTER — Emergency Department (HOSPITAL_COMMUNITY): Payer: Medicare Other

## 2011-02-19 ENCOUNTER — Encounter: Payer: Self-pay | Admitting: Emergency Medicine

## 2011-02-19 ENCOUNTER — Emergency Department (HOSPITAL_COMMUNITY)
Admission: EM | Admit: 2011-02-19 | Discharge: 2011-02-19 | Disposition: A | Discharge status: Home / Self Care | Payer: Medicare Other | Attending: Emergency Medicine | Admitting: Emergency Medicine

## 2011-02-19 DIAGNOSIS — Y92009 Unspecified place in unspecified non-institutional (private) residence as the place of occurrence of the external cause: Secondary | ICD-10-CM | POA: Insufficient documentation

## 2011-02-19 DIAGNOSIS — X361XXA Avalanche, landslide, or mudslide, initial encounter: Secondary | ICD-10-CM | POA: Insufficient documentation

## 2011-02-19 DIAGNOSIS — S61412A Laceration without foreign body of left hand, initial encounter: Secondary | ICD-10-CM

## 2011-02-19 DIAGNOSIS — I10 Essential (primary) hypertension: Secondary | ICD-10-CM | POA: Insufficient documentation

## 2011-02-19 DIAGNOSIS — S61409A Unspecified open wound of unspecified hand, initial encounter: Secondary | ICD-10-CM | POA: Insufficient documentation

## 2011-02-19 DIAGNOSIS — E119 Type 2 diabetes mellitus without complications: Secondary | ICD-10-CM | POA: Insufficient documentation

## 2011-02-19 DIAGNOSIS — S66909A Unspecified injury of unspecified muscle, fascia and tendon at wrist and hand level, unspecified hand, initial encounter: Secondary | ICD-10-CM | POA: Insufficient documentation

## 2011-02-19 DIAGNOSIS — F17200 Nicotine dependence, unspecified, uncomplicated: Secondary | ICD-10-CM | POA: Insufficient documentation

## 2011-02-19 LAB — GLUCOSE, CAPILLARY: Glucose-Capillary: 155 mg/dL — ABNORMAL HIGH (ref 70–99)

## 2011-02-19 MED ORDER — LIDOCAINE HCL (PF) 1 % IJ SOLN
10.0000 mL | Freq: Once | INTRAMUSCULAR | Status: DC
  Filled 2011-02-19: qty 10

## 2011-02-19 MED ORDER — VANCOMYCIN HCL IN DEXTROSE 1-5 GM/200ML-% IV SOLN
1000.0000 mg | Freq: Once | INTRAVENOUS | Status: AC
  Administered 2011-02-19: 1000 mg via INTRAVENOUS
  Filled 2011-02-19: qty 200

## 2011-02-19 MED ORDER — TETANUS-DIPHTHERIA TOXOIDS TD 5-2 LFU IM INJ
0.5000 mL | INJECTION | Freq: Once | INTRAMUSCULAR | Status: AC
  Administered 2011-02-19: 0.5 mL via INTRAMUSCULAR
  Filled 2011-02-19: qty 0.5

## 2011-02-19 MED ORDER — DOXYCYCLINE HYCLATE 100 MG PO CAPS: 100.0000 mg | ORAL_CAPSULE | Freq: Two times a day (BID) | ORAL | Status: AC

## 2011-02-19 MED ORDER — OXYCODONE-ACETAMINOPHEN 5-325 MG PO TABS: 1.0000 | ORAL_TABLET | ORAL | Status: AC | PRN

## 2011-02-19 MED ORDER — PIPERACILLIN-TAZOBACTAM 3.375 G IVPB
3.3750 g | Freq: Once | INTRAVENOUS | Status: AC
  Administered 2011-02-19: 3.375 g via INTRAVENOUS
  Filled 2011-02-19: qty 50

## 2011-02-19 MED ORDER — BACITRACIN-NEOMYCIN-POLYMYXIN 400-5-5000 EX OINT
TOPICAL_OINTMENT | Freq: Once | CUTANEOUS | Status: AC
  Administered 2011-02-19: 18:00:00 via TOPICAL
  Filled 2011-02-19 (×2): qty 1

## 2011-02-19 MED ORDER — MORPHINE SULFATE 4 MG/ML IJ SOLN
4.0000 mg | Freq: Once | INTRAMUSCULAR | Status: AC
  Administered 2011-02-19: 4 mg via INTRAVENOUS
  Filled 2011-02-19: qty 1

## 2011-02-19 MED ORDER — OXYCODONE-ACETAMINOPHEN 5-325 MG PO TABS
1.0000 | ORAL_TABLET | Freq: Once | ORAL | Status: AC
  Administered 2011-02-19: 1 via ORAL
  Filled 2011-02-19: qty 1

## 2011-02-19 NOTE — ED Notes (Signed)
Dr. Stark Jock at the bedside for evaluation of the patient.

## 2011-02-19 NOTE — ED Notes (Signed)
Pt has laceration between index and middle fingers on left hand from metal rod. C/m/s intact.

## 2011-02-19 NOTE — ED Notes (Addendum)
Returned to patient's room to take him down to room 5, and patient's color and mental status markedly improved.  His skin is now warm and dry, pink undertones; denies symptoms of dizziness.  VS re-checked.  Cyndi Bender, PA-C back to bedside for re-evaluation.

## 2011-02-19 NOTE — ED Provider Notes (Signed)
History     CSN: [id number] Arrival date & time: 02/19/2011 12:51 PM  Chief Complaint  Patient presents with  . Laceration    (Consider location/radiation/quality/duration/timing/severity/associated sxs/prior treatment) HPI Comments: Patient c/o laceration to the left hand between the index and middle finger.  States that he was cleaning out his basement and some type of metal rod "went through my hand"  He denies numbness or weakness of the hand and denies other injuries.    Patient is a 60 y.o. male presenting with skin laceration. The history is provided by the patient.  Laceration  The incident occurred 1 to 2 hours ago. The laceration is located on the left hand. The laceration is 6 cm in size. Injury mechanism: metal rod. The pain is moderate. The pain has been constant since onset. It is unknown if a foreign body is present. His tetanus status is unknown.    Past Medical History  Diagnosis Date  . Diabetes mellitus   . Hypertension     History reviewed. No pertinent past surgical history.  History reviewed. No pertinent family history.  History  Substance Use Topics  . Smoking status: Current Everyday Smoker -- 0.5 packs/day  . Smokeless tobacco: Not on file  . Alcohol Use: No      Review of Systems  Constitutional: Negative for fever.  Musculoskeletal: Positive for myalgias. Negative for back pain, joint swelling and gait problem.  Skin: Positive for wound.  Neurological: Negative for dizziness, weakness, light-headedness and numbness.  Hematological: Negative for adenopathy. Does not bruise/bleed easily.  All other systems reviewed and are negative.    Allergies  Review of patient's allergies indicates no active allergies.  Home Medications   Current Outpatient Rx  Name Route Sig Dispense Refill  . LISINOPRIL 20 MG PO TABS Oral Take 20 mg by mouth daily.      . DULOXETINE HCL 30 MG PO CPEP Oral Take 30 mg by mouth daily.      Marland Kitchen GABAPENTIN 300 MG PO  CAPS Oral Take 300 mg by mouth 3 (three) times daily.        BP 147/78  Pulse 85  Temp(Src) 98.3 F (36.8 C) (Oral)  Resp 20  Ht $R'5\' 10"'zr$  (1.778 m)  Wt 154 lb (69.854 kg)  BMI 22.10 kg/m2  SpO2 100%  Physical Exam  Nursing note and vitals reviewed. Constitutional: He is oriented to person, place, and time. He appears well-developed and well-nourished. No distress.  HENT:  Head: Normocephalic and atraumatic.  Mouth/Throat: Oropharynx is clear and moist.  Eyes: EOM are normal. Pupils are equal, round, and reactive to light.  Cardiovascular: Normal rate, regular rhythm and normal heart sounds.   Pulmonary/Chest: Effort normal and breath sounds normal. He exhibits no tenderness.  Musculoskeletal: He exhibits tenderness. He exhibits no edema.       Left hand: He exhibits tenderness and laceration. He exhibits normal two-point discrimination, normal capillary refill and no swelling. normal sensation noted. He exhibits no finger abduction and no wrist extension trouble.       6 cm macerated laceration to the web space of the left second and third fingers, tendons exposed w/o obvious tendon lacerations, bone also exposed without obvious bony injury seen   Neurological: He is alert and oriented to person, place, and time. He has normal strength. No cranial nerve deficit or sensory deficit.  Skin: Skin is warm. No rash noted. No erythema.       See musculoskeletal exam    ED  Course  Irrigation Date/Time: 02/19/2011 5:00 PM Performed by: Vanessa Castalia, Chrishawna Farina L. Authorized by: Veryl Speak Consent: Verbal consent obtained. Written consent not obtained. Risks and benefits: risks, benefits and alternatives were discussed Consent given by: patient Patient understanding: patient states understanding of the procedure being performed Patient consent: the patient's understanding of the procedure matches consent given Procedure consent: procedure consent matches procedure scheduled Imaging studies:  imaging studies available Patient identity confirmed: verbally with patient Time out: Immediately prior to procedure a "time out" was called to verify the correct patient, procedure, equipment, support staff and site/side marked as required. Preparation: Patient was prepped and draped in the usual sterile fashion. Local anesthesia used: no Patient sedated: no Patient tolerance: Patient tolerated the procedure well with no immediate complications. Comments: Laceration was irrigated extensively with betadine and saline solution using a 10 cc syringe   (including critical care time)  Labs Reviewed  GLUCOSE, CAPILLARY - Abnormal; Notable for the following:    Glucose-Capillary 155 (*)    All other components within normal limits   Dg Hand Complete Left  02/19/2011  *RADIOLOGY REPORT*  Clinical Data: Hand laceration.  LEFT HAND - COMPLETE 3+ VIEW  Comparison: None.  Findings: Evidence of soft tissue injury between the second and third fingers.  There is widening of the space between the second and third metacarpal heads compatible with soft tissue injury.  No fracture.  Small radiopaque density in the soft tissues between the second and third proximal phalanges.  Mild joint space narrowing within the second and third MCP joints compatible with arthritis.  Degenerative changes of the first carpal metacarpal joint.  IMPRESSION: Soft tissue injury between the second and third digits with widening of the space between the second and third metacarpal heads.  No fracture.  Small radiopaque foreign body.  Original Report Authenticated By: Raelyn Number, M.D.        MDM    1515 patient feeling better, color improved, now sitting upright in a chair. Vitals have improved. Has drank fluids and ate crackers.  EDP to evaluate the patient also.      Macerated laceration to the web space of the second and third fingers of the left hand.  Extensor tendons exposed. Circular puncture type wound to the  palmar surface of the proximal left index finger. Distal sensation is intact, radial pulse is strong and equal bilaterally, CR<2 sec.  Patient is able to move digits, has full extension of the fingers.     [place]  Dr. Stark Jock consulted hand surgery on call, Dr. Sharol Given.  I was advised by the EDP to irrigate the wound with betadine/saline solution, give IV vancomycin and zosyn and patient agree's to f/u with Dr. Sharol Given tomorrow in his office.        Jewelz Ricklefs L. [place], [place] 02/19/11 1736

## 2011-02-19 NOTE — ED Notes (Signed)
Entered patient's room for rounding and noted that he was pale, cool, and diaphoretic.  CBG and VS checked.  Cyndi Bender, PA-C at bedside.  Pt is alert, oriented and states his symptoms are due to the pain in his hand.  Pt denies taking any medications since coming to the ED.  Pt also denies eating today - states he's only had coffee.  Pt assisted to stretcher.

## 2011-02-19 NOTE — ED Notes (Signed)
Returned from radiology.  Awaiting eval by provider.

## 2011-02-21 ENCOUNTER — Ambulatory Visit (HOSPITAL_COMMUNITY)
Admission: AD | Admit: 2011-02-21 | Discharge: 2011-02-21 | Disposition: A | Discharge status: Home / Self Care | Payer: Medicare Other | Source: Ambulatory Visit | Attending: Orthopedic Surgery | Admitting: Orthopedic Surgery

## 2011-02-21 ENCOUNTER — Ambulatory Visit (HOSPITAL_COMMUNITY): Payer: Medicare Other

## 2011-02-21 DIAGNOSIS — IMO0001 Reserved for inherently not codable concepts without codable children: Secondary | ICD-10-CM | POA: Insufficient documentation

## 2011-02-21 DIAGNOSIS — I10 Essential (primary) hypertension: Secondary | ICD-10-CM | POA: Insufficient documentation

## 2011-02-21 DIAGNOSIS — IMO0002 Reserved for concepts with insufficient information to code with codable children: Secondary | ICD-10-CM | POA: Insufficient documentation

## 2011-02-21 DIAGNOSIS — Z01812 Encounter for preprocedural laboratory examination: Secondary | ICD-10-CM | POA: Insufficient documentation

## 2011-02-21 DIAGNOSIS — E119 Type 2 diabetes mellitus without complications: Secondary | ICD-10-CM | POA: Insufficient documentation

## 2011-02-21 DIAGNOSIS — Z01810 Encounter for preprocedural cardiovascular examination: Secondary | ICD-10-CM | POA: Insufficient documentation

## 2011-02-21 DIAGNOSIS — W1809XA Striking against other object with subsequent fall, initial encounter: Secondary | ICD-10-CM | POA: Insufficient documentation

## 2011-02-21 DIAGNOSIS — S61409A Unspecified open wound of unspecified hand, initial encounter: Secondary | ICD-10-CM | POA: Insufficient documentation

## 2011-02-21 DIAGNOSIS — F17200 Nicotine dependence, unspecified, uncomplicated: Secondary | ICD-10-CM | POA: Insufficient documentation

## 2011-02-21 DIAGNOSIS — Z8673 Personal history of transient ischemic attack (TIA), and cerebral infarction without residual deficits: Secondary | ICD-10-CM | POA: Insufficient documentation

## 2011-02-21 DIAGNOSIS — Y998 Other external cause status: Secondary | ICD-10-CM | POA: Insufficient documentation

## 2011-02-21 DIAGNOSIS — M129 Arthropathy, unspecified: Secondary | ICD-10-CM | POA: Insufficient documentation

## 2011-02-21 DIAGNOSIS — F411 Generalized anxiety disorder: Secondary | ICD-10-CM | POA: Insufficient documentation

## 2011-02-21 DIAGNOSIS — Z01818 Encounter for other preprocedural examination: Secondary | ICD-10-CM | POA: Insufficient documentation

## 2011-02-21 LAB — CBC
HCT: 38.2 % — ABNORMAL LOW (ref 39.0–52.0)
Hemoglobin: 13.6 g/dL (ref 13.0–17.0)
MCH: 32.1 pg (ref 26.0–34.0)
MCV: 90.1 fL (ref 78.0–100.0)
RBC: 4.24 MIL/uL (ref 4.22–5.81)

## 2011-02-21 LAB — COMPREHENSIVE METABOLIC PANEL
ALT: 15 U/L (ref 0–53)
AST: 15 U/L (ref 0–37)
Alkaline Phosphatase: 93 U/L (ref 39–117)
CO2: 27 mEq/L (ref 19–32)
Calcium: 9.6 mg/dL (ref 8.4–10.5)
Chloride: 104 mEq/L (ref 96–112)
GFR calc non Af Amer: >60 mL/min (ref >60)
Potassium: 4 mEq/L (ref 3.5–5.1)
Sodium: 139 mEq/L (ref 135–145)

## 2011-02-21 LAB — GLUCOSE, CAPILLARY
Glucose-Capillary: 119 mg/dL — ABNORMAL HIGH (ref 70–99)
Glucose-Capillary: 110 mg/dL — ABNORMAL HIGH (ref 70–99)

## 2011-02-21 LAB — APTT: aPTT: 31 seconds (ref 24–37)

## 2011-02-21 NOTE — ED Provider Notes (Signed)
Medical screening examination/treatment/procedure(s) were conducted as a shared visit with non-physician practitioner(s) and myself.  I personally evaluated the patient during the encounter.  The wound on the patient's hand appeared to be inconsistent with the mechanism of injury he reported.  I spoke with Dr. Sharol Given who was on call for hand surgery.  He wants [place] to wash out the wound, give antibiotics, and he will see him in the office tomorrow.  Veryl Speak, MD 02/21/11 [phone]

## 2011-03-03 ENCOUNTER — Encounter: Payer: Medicare Other | Attending: Physical Medicine & Rehabilitation | Admitting: Physical Medicine & Rehabilitation

## 2011-03-03 DIAGNOSIS — M7530 Calcific tendinitis of unspecified shoulder: Secondary | ICD-10-CM

## 2011-03-03 DIAGNOSIS — M47817 Spondylosis without myelopathy or radiculopathy, lumbosacral region: Secondary | ICD-10-CM

## 2011-03-03 DIAGNOSIS — R209 Unspecified disturbances of skin sensation: Secondary | ICD-10-CM | POA: Insufficient documentation

## 2011-03-03 DIAGNOSIS — M47812 Spondylosis without myelopathy or radiculopathy, cervical region: Secondary | ICD-10-CM

## 2011-03-03 DIAGNOSIS — M25519 Pain in unspecified shoulder: Secondary | ICD-10-CM | POA: Insufficient documentation

## 2011-03-03 DIAGNOSIS — E119 Type 2 diabetes mellitus without complications: Secondary | ICD-10-CM | POA: Insufficient documentation

## 2011-03-03 DIAGNOSIS — M79609 Pain in unspecified limb: Secondary | ICD-10-CM | POA: Insufficient documentation

## 2011-03-03 DIAGNOSIS — Z9889 Other specified postprocedural states: Secondary | ICD-10-CM | POA: Insufficient documentation

## 2011-03-03 DIAGNOSIS — IMO0002 Reserved for concepts with insufficient information to code with codable children: Secondary | ICD-10-CM

## 2011-03-03 NOTE — Assessment & Plan Note (Signed)
Ryan Butler is back regarding his back pain.  Unfortunately, he fell at the end of September in his basement and had a complex injury to his left wrist and hand.  He sees Dr. Sharol Given now and had surgery on February 13, 2011, for repair.  I do not have any details of this surgery or admission.  He receives oxycodone from Dr. Sharol Given for pain relief.  We started Neurontin at last visit for neuropathic pain.  Also added the lisinopril back on board for his uncontrolled hypertension.  The patient rates his pain 8/10 today and his pain is most prominent in the left upper extremity from shoulder to hand.  He does have numbness in the left index finger.  The left hand is in a splint of sort still.  REVIEW OF SYSTEMS:  Notable for the above.  Full 12-point review is in the written health and history section of the chart.  SOCIAL HISTORY:  Unchanged.  PHYSICAL EXAMINATION:  GENERAL:  Blood pressure is 188/88, pulse 76, respiratory rate 18, he is satting 97% on room air. GENERAL:  Patient is pleasant, alert. MUSCULOSKELETAL:  Left hand is in a splint and is elevated.  He has trace edema at the left hand.  The skin itself was not examined within the wounds.  He had numbness, decreased pinprick over the left index finger.  He was able to move all fingers of the hand as well as wrist to a certain extent. Low back and lower extremity pain is generally equivocal to positive. Pain seemed more prominent in shoulders and arms today.  He transfers without difficulty and gait was within normal limits. PSYCHIATRIC: Cognitively, he is alert and appropriate. HEART:  Regular. CHEST:  Clear. ABDOMEN:  Soft nontender.  ASSESSMENT: 1. History of multiple traumas with lumbar and cervical spine     spondylosis with radiculopathies. 2. Will see another orthopedic trauma noted above to the left hand. 3. History of questionable carpal tunnel syndrome. 4. History of diabetes and hypertension uncontrolled.  PLAN: 1. I  think we need to let the dust settle a bit here regarding his     acute problem.  He will follow up with Dr. Sharol Given to completion of     his orthopedic injury. 2. I did give him oxycodone 5/325 one q.8 hours p.r.n. for     breakthrough pain. 3. We will increase his Neurontin to 600 mg q.8 hours for control of     his nerve root pain related symptoms.  Observe for any potential     positive effects.  Reviewed potential side effects with the     patient. 4. I will follow up with my nurse practitioner in about a month.     Counsel the patient in regarding smoking cessation, safer living     environment.     Meredith Staggers, M.D. Electronically Signed    ZTS/MedQ D:  03/03/2011 14:27:20  T:  03/03/2011 21:38:39  Job #:  [id number]  cc:   Darrick Penna. Linna Darner, MD,FACP,FCCP [place] [address]

## 2011-03-12 NOTE — Op Note (Signed)
NAMEEFFREY, DAVIDOW                ACCOUNT NO.:  [id number]  MEDICAL RECORD NO.:  [id number]  LOCATION:  [place]                         FACILITY:  [facility]  PHYSICIAN:  Newt Minion, MD     DATE OF BIRTH:  1950-09-06  DATE OF PROCEDURE:  02/21/2011 DATE OF DISCHARGE:  02/21/2011                              OPERATIVE REPORT   PREOPERATIVE DIAGNOSIS:  Complex laceration with segmental skin loss in the webspace between the index and long finger extending palmarly and dorsally.  POSTOPERATIVE DIAGNOSIS:  Complex laceration with segmental skin loss in the webspace between the index and long finger extending palmarly and dorsally.  PROCEDURE:  Irrigation and debridement, complex wound, 6 cm in diameter with application of Accell powder, Accell tissue graft with bolster dressing.  SURGEON:  Newt Minion, MD  ANESTHESIA:  General.  ESTIMATED BLOOD LOSS:  Minimal.  ANTIBIOTICS:  One gram of Kefzol.  DRAINS:  None.  COMPLICATIONS:  None.  TOURNIQUET TIME:  None.  DISPOSITION:  To PACU in stable condition.  INDICATION FOR PROCEDURE:  The patient is a 60 year old gentleman who reported that he fell into some metal pipes and rods sustaining the complex laceration with segmental skin loss in the webspace to the index and long finger.  The patient was initially seen at [facility].  He underwent initial irrigation and debridement in the emergency room and was placed in a sterile dressing and was seen in the office today for initial evaluation.  On evaluation, the patient had a absent digital sensation on the ulnar side of the index finger and radial side of the long finger.  He had intact extensor and flexor function.  He had a large soft tissue defect in the webspace to the index long finger and had no exposed tendon.  He presented at this time for surgical intervention.  Risks and benefits were discussed including infection, neurovascular injury, persistent numbness, need for  additional surgery. The patient states he understands and wished to proceed at this time.  DESCRIPTION OF PROCEDURE:  The patient was brought to OR room #4 and underwent general anesthetic.  After adequate level of anesthesia was obtained, the patient's left lower extremity was prepped using DuraPrep and draped into a sterile field.  The wound was extensively irrigated and debrided with normal saline with the skin and soft tissue debrided with a sharp knife.  There was over a 2 cm defect to the digital nerve on the ulnar side of the index finger and radial side of the long finger and this defect was not repaired.  There was also loss of the arterial bundle as well.  This was also not repaired.  After debridement there was good healthy viable tissue.  There was what appeared to be a metal fragment on the radiograph and no metal fragment was identified within the wound.  The edges of the wound were closed using 4-0 nylon.  The Accell tissue graft was then fenestrated.  Accell powder was placed to correct the soft tissue defect.  This was then covered with the Accell graft which was sutured in place.  The wound was then covered with Mepitel, 4x4s, fluffed up, Webril,  and Coban.  The patient was extubated, taken to PACU in stable condition.  Prescription for Percocet for pain.  Follow up in the office on Wednesday.     Newt Minion, MD     MVD/MEDQ  D:  02/21/2011  T:  02/22/2011  Job:  [id number]  Electronically Signed by Meridee Score MD on 03/12/2011 55:25:89 AM

## 2011-03-31 ENCOUNTER — Encounter: Payer: Medicare Other | Attending: Neurosurgery | Admitting: Neurosurgery

## 2011-03-31 DIAGNOSIS — M79609 Pain in unspecified limb: Secondary | ICD-10-CM | POA: Insufficient documentation

## 2011-03-31 DIAGNOSIS — M47812 Spondylosis without myelopathy or radiculopathy, cervical region: Secondary | ICD-10-CM | POA: Insufficient documentation

## 2011-03-31 DIAGNOSIS — IMO0001 Reserved for inherently not codable concepts without codable children: Secondary | ICD-10-CM | POA: Insufficient documentation

## 2011-03-31 DIAGNOSIS — M47817 Spondylosis without myelopathy or radiculopathy, lumbosacral region: Secondary | ICD-10-CM | POA: Insufficient documentation

## 2011-03-31 DIAGNOSIS — M545 Low back pain, unspecified: Secondary | ICD-10-CM | POA: Insufficient documentation

## 2011-03-31 DIAGNOSIS — G894 Chronic pain syndrome: Secondary | ICD-10-CM

## 2011-03-31 DIAGNOSIS — X58XXXS Exposure to other specified factors, sequela: Secondary | ICD-10-CM | POA: Insufficient documentation

## 2011-03-31 NOTE — Assessment & Plan Note (Signed)
This is a patient of Dr. Naaman Plummer, seen for back and upper extremity pain, especially his left upper extremity where he has had surgery with Dr. Sharol Given on his hand for an accident he had there.  He has had surgical correction on that hand, still under the care of Dr. Sharol Given.  He rates his average pain at 8.  It is a sharp, burning, stabbing, tingling, aching pain.  General activity level is 2-8.  Pain is worse about the same 24 hours a day.  Sleep patterns are poor.  Pain is worse with walking and standing.  Medication tends to help some.  He can walk about 30 minutes at a time.  He climb steps and drives.  He is on disability.  REVIEW OF SYSTEMS:  Notable for difficulties as described above as well as some high blood sugar, night sweats, limb swelling, weakness, tingling, trouble walking, dizziness, confusion, anxiety.  No suicidal thoughts or aberrant behaviors.  Pill count and UDS consistent given the fact he just had surgery on his hand and he has had no other medicines other than what were given him.  Past medical history, social history, family history unchanged.  PHYSICAL EXAMINATION:  VITAL SIGNS:  His blood pressure is 115/68, pulse 75, respirations 16, O2 sats 98 on room air. MUSCULOSKELETAL:  His motor strength is intact in the upper and lower extremities except for the left upper extremity.  Hand and forearm were pretty sore.  Sensation is intact. NEUROLOGIC:  Constitutionally, he is within normal limits.  He is alert and oriented x3.  He has normal gait.  ASSESSMENT: 1. History of multiple trauma with lumbar and cervical spine     spondylosis. 2. Left hand trauma, care of Dr. Sharol Given.  PLAN:  Refill oxycodone 5/325 one p.o. q.6 hours p.r.n., 90 with no refill.  His questions were encouraged and answered.  He will continue to follow up with Dr. Sharol Given as well as here.  We will see him back here in a month.     Rola Lennon L. Blenda Nicely Electronically Signed    RLW/MedQ D:   03/31/2011 14:40:00  T:  03/31/2011 19:56:55  Job #:  [id number]

## 2011-04-30 ENCOUNTER — Other Ambulatory Visit: Payer: Self-pay | Admitting: Physical Medicine & Rehabilitation

## 2011-04-30 ENCOUNTER — Encounter: Payer: Medicare Other | Attending: Neurosurgery | Admitting: Neurosurgery

## 2011-04-30 DIAGNOSIS — IMO0001 Reserved for inherently not codable concepts without codable children: Secondary | ICD-10-CM | POA: Insufficient documentation

## 2011-04-30 DIAGNOSIS — M47817 Spondylosis without myelopathy or radiculopathy, lumbosacral region: Secondary | ICD-10-CM | POA: Insufficient documentation

## 2011-04-30 DIAGNOSIS — M79609 Pain in unspecified limb: Secondary | ICD-10-CM | POA: Insufficient documentation

## 2011-04-30 DIAGNOSIS — M47812 Spondylosis without myelopathy or radiculopathy, cervical region: Secondary | ICD-10-CM | POA: Insufficient documentation

## 2011-04-30 DIAGNOSIS — M545 Low back pain, unspecified: Secondary | ICD-10-CM

## 2011-04-30 DIAGNOSIS — M25549 Pain in joints of unspecified hand: Secondary | ICD-10-CM

## 2011-04-30 DIAGNOSIS — X58XXXS Exposure to other specified factors, sequela: Secondary | ICD-10-CM | POA: Insufficient documentation

## 2011-04-30 DIAGNOSIS — G8921 Chronic pain due to trauma: Secondary | ICD-10-CM

## 2011-04-30 NOTE — Assessment & Plan Note (Signed)
This patient of Dr. Naaman Plummer seen for back and upper extremity pain.  He reports having some left hand pain still.  He is still following up with Dr. Sharol Given regarding that injury.  The patient is complaining of a sharp, burning, stabbing, tingling and aching pain.  He rates it an 8 throughout his entire spine and general activity level is 1 to an 8. The pain is same 24 hours a day.  Sleep patterns are poor.  All activities aggravate.  Medication helps some.  Mobility is independent. He does drive.  He can walk up to 30 minutes at a time.  Functionally, he is unemployed.  He needs some help with meal prep, household duties.  REVIEW OF SYSTEMS:  Notable for the difficulties described above as well as some night sweats, blood sugar fluctuations, limb swelling, weakness, tingling, trouble walking, confusion and depression.  No suicidal thoughts or aberrant behaviors.  Pill counts correct.  Last UDS consistent.  PAST MEDICAL HISTORY, SOCIAL HISTORY AND FAMILY HISTORY:  Unchanged.  PHYSICAL EXAMINATION:  VITAL SIGNS:  His blood pressure is 177/94, pulse 78, respirations 16 and O2 sats 100 on room air.  EXTREMITIES:  His motor strength and sensation are intact. NEUROLOGIC:  Constitutionally, he is thin.  He is alert and oriented x3. He has a slight limp to his gait.  ASSESSMENT: 1. History of multiple trauma, lumbar and cervical spine spondylosis. 2. Left hand trauma under the care of Dr. Sharol Given.  PLAN: 1. Due to his increased back pain, we are going to obtain an MRI of     the lumbar spine without gadolinium. 2. Start him on Robaxin 500 mg 1 p.o. q.8 h. p.r.n. spasm 90 with no     refill. 3. Relafen 500 mg 1 p.o. b.i.d., 60 with 1 refill. 4. Oxycodone 5/325 1 p.o. q.6 h. p.r.n., 90 with no refills.  His     questions were encouraged and answered.  We will see him back in a     month.     Monnie Anspach L. Blenda Nicely Electronically Signed    RLW/MedQ D:  04/30/2011 13:07:44  T:   04/30/2011 23:20:04  Job #:  [id number]

## 2011-05-01 ENCOUNTER — Ambulatory Visit (HOSPITAL_COMMUNITY)
Admission: RE | Admit: 2011-05-01 | Discharge: 2011-05-01 | Disposition: A | Discharge status: Home / Self Care | Payer: Medicare Other | Source: Ambulatory Visit | Attending: Physical Medicine & Rehabilitation | Admitting: Physical Medicine & Rehabilitation

## 2011-05-01 DIAGNOSIS — M545 Low back pain, unspecified: Secondary | ICD-10-CM | POA: Insufficient documentation

## 2011-05-01 DIAGNOSIS — M5126 Other intervertebral disc displacement, lumbar region: Secondary | ICD-10-CM | POA: Insufficient documentation

## 2011-05-01 DIAGNOSIS — R209 Unspecified disturbances of skin sensation: Secondary | ICD-10-CM | POA: Insufficient documentation

## 2011-06-04 ENCOUNTER — Encounter: Payer: Medicare Other | Admitting: Neurosurgery

## 2011-09-03 ENCOUNTER — Other Ambulatory Visit (HOSPITAL_COMMUNITY): Payer: Self-pay | Admitting: Internal Medicine

## 2011-09-03 DIAGNOSIS — I639 Cerebral infarction, unspecified: Secondary | ICD-10-CM

## 2011-09-03 DIAGNOSIS — R55 Syncope and collapse: Secondary | ICD-10-CM

## 2011-09-05 ENCOUNTER — Ambulatory Visit (HOSPITAL_COMMUNITY): Payer: Medicare Other

## 2011-09-09 ENCOUNTER — Other Ambulatory Visit: Payer: Self-pay | Admitting: Internal Medicine

## 2011-09-12 ENCOUNTER — Ambulatory Visit (HOSPITAL_COMMUNITY)
Admission: RE | Admit: 2011-09-12 | Discharge: 2011-09-12 | Disposition: A | Discharge status: Home / Self Care | Payer: Medicare Other | Source: Ambulatory Visit | Attending: Internal Medicine | Admitting: Internal Medicine

## 2011-09-12 DIAGNOSIS — I639 Cerebral infarction, unspecified: Secondary | ICD-10-CM

## 2011-09-12 DIAGNOSIS — R55 Syncope and collapse: Secondary | ICD-10-CM | POA: Insufficient documentation

## 2011-09-12 DIAGNOSIS — I6350 Cerebral infarction due to unspecified occlusion or stenosis of unspecified cerebral artery: Secondary | ICD-10-CM | POA: Insufficient documentation

## 2011-09-12 DIAGNOSIS — R51 Headache: Secondary | ICD-10-CM | POA: Insufficient documentation

## 2011-09-17 ENCOUNTER — Encounter: Payer: Self-pay | Admitting: Cardiology

## 2011-09-17 ENCOUNTER — Ambulatory Visit (INDEPENDENT_AMBULATORY_CARE_PROVIDER_SITE_OTHER): Payer: Medicare Other | Admitting: Cardiology

## 2011-09-17 ENCOUNTER — Encounter: Payer: Self-pay | Admitting: *Deleted

## 2011-09-17 VITALS — BP 147/92 | HR 94 | Ht 70.0 in | Wt 161.0 lb

## 2011-09-17 DIAGNOSIS — F17200 Nicotine dependence, unspecified, uncomplicated: Secondary | ICD-10-CM

## 2011-09-17 DIAGNOSIS — I10 Essential (primary) hypertension: Secondary | ICD-10-CM

## 2011-09-17 DIAGNOSIS — E782 Mixed hyperlipidemia: Secondary | ICD-10-CM

## 2011-09-17 DIAGNOSIS — R072 Precordial pain: Secondary | ICD-10-CM

## 2011-09-17 DIAGNOSIS — E119 Type 2 diabetes mellitus without complications: Secondary | ICD-10-CM

## 2011-09-17 DIAGNOSIS — R079 Chest pain, unspecified: Secondary | ICD-10-CM

## 2011-09-17 DIAGNOSIS — Z9889 Other specified postprocedural states: Secondary | ICD-10-CM

## 2011-09-17 NOTE — Assessment & Plan Note (Signed)
Smoking cessation was discussed.

## 2011-09-17 NOTE — Assessment & Plan Note (Signed)
Stated he was previously on Lipitor. Recent lab work obtained by Dr. Legrand Rams. Would recommend resumption of statin therapy, particularly if LDL is over 100.

## 2011-09-17 NOTE — Assessment & Plan Note (Signed)
Recent followup carotid Dopplers showed less than 50% ICA stenosis. Continue aspirin and recommend statin therapy.

## 2011-09-17 NOTE — Assessment & Plan Note (Signed)
Noted intermittently over the last few weeks. Baseline ECG is normal. He does have several cardiac risk factors including gender, active tobacco abuse, hypertension that has been untreated until recently, reported type 2 diabetes mellitus that is also untreated, reported hyperlipidemia, known carotid artery disease. Not clear that he is undergone any prior cardiac ischemic workup, remote echocardiogram demonstrated normal LV function. Plan at this point is to pursue further workup including repeat echocardiogram and an exercise Myoview. We will plan to call him with the results, and can arrange additional followup if needed. If his stress testing is low risk, would still recommend aggressive risk factor modification, which will principally rely on his willingness to follow regularly with Dr. Legrand Rams.

## 2011-09-17 NOTE — Patient Instructions (Signed)
Your follow up will be based on your test results.  Your physician recommends that you continue on your current medications as directed. Please refer to the Current Medication list given to you today. Your physician has requested that you have an echocardiogram. Echocardiography is a painless test that uses sound waves to create images of your heart. It provides your doctor with information about the size and shape of your heart and how well your heart's chambers and valves are working. This procedure takes approximately one hour. There are no restrictions for this procedure. Your physician has requested that you have en exercise stress myoview. For further information please visit [email or web address]. Please follow instruction sheet, as given.  If the results of your test are normal or stable, you will receive a letter. If they are abnormal, the nurse will contact you by phone.

## 2011-09-17 NOTE — Assessment & Plan Note (Signed)
Based on history, currently not on any specific medical therapies.

## 2011-09-17 NOTE — Assessment & Plan Note (Signed)
Recently initiated on antihypertensives, outlined above. Also discussed sodium restriction with him.

## 2011-09-17 NOTE — Progress Notes (Signed)
   Clinical Summary Ryan Butler is a 61 y.o.male referred for cardiology consultation by Dr. Legrand Rams. Limited records reviewed. He has had no regular medical followup over several years, recently established with Dr. Legrand Rams. He has been on no regular medications until just recently.  Recent carotid Dopplers from 4/19 indicate less than 50% bilateral ICA stenosis. Recent head CT reports atherosclerotic calcification of the major vessels at the base of the brain with normal appearance of the brain itself. These were obtained by Dr. Legrand Rams. From a symptom perspective he reports chronic right-sided headaches, also unexplained episode of syncope. Records indicate prior history of seizures, details not clear.  He does indicate intermittent left-sided chest pain with some radiation to the left arm. There is no definite history of CAD or myocardial infarction, not clear that he has undergone any prior ischemic workup however. He does have multiple cardiac risk factors, continues to smoke cigarettes.  Remote echocardiogram from 2006 reported normal LVEF of 55-65% with no wall motion abnormalities, diastolic dysfunction, mild calcification of the mitral valve with trivial MR and mild TR.  ECG today is normal.  No Known Allergies  Current Outpatient Prescriptions  Medication Sig Dispense Refill  . amLODipine (NORVASC) 5 MG tablet Take 5 mg by mouth daily.      Marland Kitchen aspirin EC 81 MG tablet Take 81 mg by mouth daily.      Marland Kitchen losartan-hydrochlorothiazide (HYZAAR) 50-12.5 MG per tablet Take 1 tablet by mouth daily.        Past Medical History  Diagnosis Date  . Type 2 diabetes mellitus   . Essential hypertension, benign   . Carotid artery disease   . Closed head injury 1996  . Polysubstance abuse     History of cocaine  . Cervical spondylosis   . History of colonic polyps   . History of seizures   . History of TIAs     Past Surgical History  Procedure Date  . Left carotid endarterectomy     6/06 - Dr.  Scot Dock  . Tibia/fibula fracture repair   . Hand laceration repair     Family History  Problem Relation Age of Onset  . Cancer Father   . Coronary artery disease Mother     Social History Mr. Sherrer reports that he has been smoking Cigarettes.  He has a 110 pack-year smoking history. He has never used smokeless tobacco. Mr. Kanaan reports that he drinks alcohol.  Review of Systems Reports chronic problems with back pain, previously seen in a pain clinic. Denies any palpitations. Stable appetite. No reported bleeding problems. No cough, fevers, chills. Was negative except as outlined.  Physical Examination Filed Vitals:   09/17/11 1424  BP: 147/92  Pulse: 94   Normal weight male in no acute distress, smells strongly of cigarettes. HEENT: Conjunctiva and lids normal, oropharynx clear with poor dentition. Neck: Supple, no elevated JVP with soft carotid bruits, no thyromegaly. Lungs: Diminished, clear to auscultation, nonlabored breathing at rest. Cardiac: Regular rate and rhythm, S4 noted with soft systolic murmur at the base, no pericardial rub. Abdomen: Soft, nontender, no hepatomegaly, bowel sounds present, no guarding or rebound. Extremities: No pitting edema, distal pulses 2+. Skin: Warm and dry. Musculoskeletal: No kyphosis. Neuropsychiatric: Alert and oriented x3, affect grossly appropriate.     Problem List and Plan

## 2011-10-01 ENCOUNTER — Other Ambulatory Visit: Payer: Self-pay

## 2011-10-01 ENCOUNTER — Other Ambulatory Visit (INDEPENDENT_AMBULATORY_CARE_PROVIDER_SITE_OTHER): Payer: Medicare Other

## 2011-10-01 DIAGNOSIS — R079 Chest pain, unspecified: Secondary | ICD-10-CM

## 2011-10-03 ENCOUNTER — Encounter: Payer: Self-pay | Admitting: *Deleted

## 2011-10-06 DIAGNOSIS — R072 Precordial pain: Secondary | ICD-10-CM

## 2011-10-07 ENCOUNTER — Telehealth: Payer: Self-pay | Admitting: *Deleted

## 2011-10-07 NOTE — Telephone Encounter (Signed)
Patient informed. 

## 2011-10-07 NOTE — Telephone Encounter (Signed)
Message copied by Merlene Laughter on Tue Oct 07, 2011  9:50 AM ------      Message from: Satira Sark      Created: Tue Oct 07, 2011  9:29 AM       Report reviewed. Overall relatively low risk study, arguing against any major degree of obstructive CAD. As indicated in my office visit, would recommend aggressive risk factor modification strategies and followup with Dr. Legrand Rams. We can see him back as needed.

## 2012-06-08 ENCOUNTER — Ambulatory Visit (HOSPITAL_COMMUNITY)
Admission: RE | Admit: 2012-06-08 | Discharge: 2012-06-08 | Disposition: A | Discharge status: Home / Self Care | Payer: Medicare Other | Source: Ambulatory Visit | Attending: Internal Medicine | Admitting: Internal Medicine

## 2012-06-08 ENCOUNTER — Other Ambulatory Visit (HOSPITAL_COMMUNITY): Payer: Self-pay | Admitting: Internal Medicine

## 2012-06-08 DIAGNOSIS — M545 Low back pain, unspecified: Secondary | ICD-10-CM | POA: Insufficient documentation

## 2012-06-08 DIAGNOSIS — M549 Dorsalgia, unspecified: Secondary | ICD-10-CM

## 2012-06-08 DIAGNOSIS — M5137 Other intervertebral disc degeneration, lumbosacral region: Secondary | ICD-10-CM | POA: Insufficient documentation

## 2012-06-08 DIAGNOSIS — M51379 Other intervertebral disc degeneration, lumbosacral region without mention of lumbar back pain or lower extremity pain: Secondary | ICD-10-CM | POA: Insufficient documentation

## 2012-06-10 ENCOUNTER — Other Ambulatory Visit (HOSPITAL_COMMUNITY): Payer: Self-pay | Admitting: Internal Medicine

## 2012-06-11 ENCOUNTER — Other Ambulatory Visit (HOSPITAL_COMMUNITY): Payer: Self-pay | Admitting: Internal Medicine

## 2012-06-11 DIAGNOSIS — M549 Dorsalgia, unspecified: Secondary | ICD-10-CM

## 2012-06-15 ENCOUNTER — Ambulatory Visit (HOSPITAL_COMMUNITY): Admission: RE | Admit: 2012-06-15 | Payer: Medicare Other | Source: Ambulatory Visit

## 2012-10-29 ENCOUNTER — Other Ambulatory Visit (HOSPITAL_COMMUNITY): Payer: Medicare Other

## 2012-10-29 ENCOUNTER — Ambulatory Visit (HOSPITAL_COMMUNITY): Payer: Medicare Other

## 2012-12-31 ENCOUNTER — Other Ambulatory Visit: Payer: Self-pay | Admitting: Neurosurgery

## 2012-12-31 DIAGNOSIS — M545 Low back pain, unspecified: Secondary | ICD-10-CM

## 2013-01-06 ENCOUNTER — Other Ambulatory Visit: Payer: Medicare Other

## 2013-01-08 ENCOUNTER — Ambulatory Visit
Admission: RE | Admit: 2013-01-08 | Discharge: 2013-01-08 | Disposition: A | Discharge status: Home / Self Care | Payer: Medicare Other | Source: Ambulatory Visit | Attending: Neurosurgery | Admitting: Neurosurgery

## 2013-01-08 DIAGNOSIS — M545 Low back pain, unspecified: Secondary | ICD-10-CM

## 2013-02-03 ENCOUNTER — Other Ambulatory Visit: Payer: Self-pay | Admitting: Neurosurgery

## 2013-02-03 DIAGNOSIS — M545 Low back pain, unspecified: Secondary | ICD-10-CM

## 2013-02-10 ENCOUNTER — Ambulatory Visit
Admission: RE | Admit: 2013-02-10 | Discharge: 2013-02-10 | Disposition: A | Discharge status: Home / Self Care | Payer: Medicare Other | Source: Ambulatory Visit | Attending: Neurosurgery | Admitting: Neurosurgery

## 2013-02-10 VITALS — BP 149/91 | HR 79

## 2013-02-10 DIAGNOSIS — M545 Low back pain, unspecified: Secondary | ICD-10-CM

## 2013-02-10 DIAGNOSIS — E119 Type 2 diabetes mellitus without complications: Secondary | ICD-10-CM

## 2013-02-10 MED ORDER — METHYLPREDNISOLONE ACETATE 40 MG/ML INJ SUSP (RADIOLOG
120.0000 mg | Freq: Once | INTRAMUSCULAR | Status: AC
  Administered 2013-02-10: 120 mg via EPIDURAL

## 2013-02-10 MED ORDER — IOHEXOL 180 MG/ML  SOLN
1.0000 mL | Freq: Once | INTRAMUSCULAR | Status: AC | PRN
  Administered 2013-02-10: 1 mL via EPIDURAL

## 2013-04-15 ENCOUNTER — Other Ambulatory Visit: Payer: Self-pay | Admitting: Neurosurgery

## 2013-04-15 DIAGNOSIS — M545 Low back pain, unspecified: Secondary | ICD-10-CM

## 2013-04-15 DIAGNOSIS — M5430 Sciatica, unspecified side: Secondary | ICD-10-CM

## 2013-04-18 ENCOUNTER — Other Ambulatory Visit: Payer: Self-pay | Admitting: Neurosurgery

## 2013-04-18 ENCOUNTER — Ambulatory Visit
Admission: RE | Admit: 2013-04-18 | Discharge: 2013-04-18 | Disposition: A | Discharge status: Home / Self Care | Payer: Medicare Other | Source: Ambulatory Visit | Attending: Neurosurgery | Admitting: Neurosurgery

## 2013-04-18 VITALS — BP 149/91 | HR 81

## 2013-04-18 DIAGNOSIS — M545 Low back pain, unspecified: Secondary | ICD-10-CM

## 2013-04-18 DIAGNOSIS — M5430 Sciatica, unspecified side: Secondary | ICD-10-CM

## 2013-04-18 MED ORDER — METHYLPREDNISOLONE ACETATE 40 MG/ML INJ SUSP (RADIOLOG
120.0000 mg | Freq: Once | INTRAMUSCULAR | Status: AC
  Administered 2013-04-18: 120 mg via EPIDURAL

## 2013-04-18 MED ORDER — IOHEXOL 180 MG/ML  SOLN
1.0000 mL | Freq: Once | INTRAMUSCULAR | Status: AC | PRN
  Administered 2013-04-18: 1 mL via EPIDURAL

## 2013-08-23 ENCOUNTER — Other Ambulatory Visit (HOSPITAL_COMMUNITY): Payer: Self-pay | Admitting: Internal Medicine

## 2013-08-23 DIAGNOSIS — R109 Unspecified abdominal pain: Secondary | ICD-10-CM

## 2013-08-30 ENCOUNTER — Ambulatory Visit (HOSPITAL_COMMUNITY)
Admission: RE | Admit: 2013-08-30 | Discharge: 2013-08-30 | Disposition: A | Discharge status: Home / Self Care | Payer: Medicare Other | Source: Ambulatory Visit | Attending: Internal Medicine | Admitting: Internal Medicine

## 2013-08-30 DIAGNOSIS — N281 Cyst of kidney, acquired: Secondary | ICD-10-CM | POA: Insufficient documentation

## 2013-08-30 DIAGNOSIS — R109 Unspecified abdominal pain: Secondary | ICD-10-CM | POA: Insufficient documentation

## 2013-10-25 ENCOUNTER — Encounter (HOSPITAL_COMMUNITY): Payer: Self-pay | Admitting: Emergency Medicine

## 2013-10-25 ENCOUNTER — Emergency Department (HOSPITAL_COMMUNITY)
Admission: EM | Admit: 2013-10-25 | Discharge: 2013-10-25 | Disposition: A | Discharge status: Home / Self Care | Payer: Medicare Other | Attending: Emergency Medicine | Admitting: Emergency Medicine

## 2013-10-25 ENCOUNTER — Emergency Department (HOSPITAL_COMMUNITY): Payer: Medicare Other

## 2013-10-25 DIAGNOSIS — I10 Essential (primary) hypertension: Secondary | ICD-10-CM | POA: Insufficient documentation

## 2013-10-25 DIAGNOSIS — M542 Cervicalgia: Secondary | ICD-10-CM | POA: Insufficient documentation

## 2013-10-25 DIAGNOSIS — Z87828 Personal history of other (healed) physical injury and trauma: Secondary | ICD-10-CM | POA: Insufficient documentation

## 2013-10-25 DIAGNOSIS — F17200 Nicotine dependence, unspecified, uncomplicated: Secondary | ICD-10-CM | POA: Insufficient documentation

## 2013-10-25 DIAGNOSIS — Z860100 Personal history of colon polyps, unspecified: Secondary | ICD-10-CM | POA: Insufficient documentation

## 2013-10-25 DIAGNOSIS — Z7982 Long term (current) use of aspirin: Secondary | ICD-10-CM | POA: Insufficient documentation

## 2013-10-25 DIAGNOSIS — M47812 Spondylosis without myelopathy or radiculopathy, cervical region: Secondary | ICD-10-CM | POA: Insufficient documentation

## 2013-10-25 DIAGNOSIS — Z8673 Personal history of transient ischemic attack (TIA), and cerebral infarction without residual deficits: Secondary | ICD-10-CM | POA: Insufficient documentation

## 2013-10-25 DIAGNOSIS — M549 Dorsalgia, unspecified: Secondary | ICD-10-CM | POA: Insufficient documentation

## 2013-10-25 DIAGNOSIS — E119 Type 2 diabetes mellitus without complications: Secondary | ICD-10-CM | POA: Insufficient documentation

## 2013-10-25 DIAGNOSIS — Z79899 Other long term (current) drug therapy: Secondary | ICD-10-CM | POA: Insufficient documentation

## 2013-10-25 DIAGNOSIS — B744 Mansonelliasis: Secondary | ICD-10-CM | POA: Insufficient documentation

## 2013-10-25 DIAGNOSIS — R42 Dizziness and giddiness: Secondary | ICD-10-CM | POA: Insufficient documentation

## 2013-10-25 DIAGNOSIS — Z8669 Personal history of other diseases of the nervous system and sense organs: Secondary | ICD-10-CM | POA: Insufficient documentation

## 2013-10-25 LAB — I-STAT CHEM 8, ED
BUN: 10 mg/dL (ref 6–23)
CALCIUM ION: 1.23 mmol/L (ref 1.13–1.30)
Chloride: 103 mEq/L (ref 96–112)
Creatinine, Ser: 0.8 mg/dL (ref 0.50–1.35)
Glucose, Bld: 121 mg/dL — ABNORMAL HIGH (ref 70–99)
HEMATOCRIT: 40 % (ref 39.0–52.0)
HEMOGLOBIN: 13.6 g/dL (ref 13.0–17.0)
POTASSIUM: 4.1 meq/L (ref 3.7–5.3)
Sodium: 141 mEq/L (ref 137–147)
TCO2: 28 mmol/L (ref 0–100)

## 2013-10-25 LAB — DIFFERENTIAL
Basophils Absolute: 0 10*3/uL (ref 0.0–0.1)
Basophils Relative: 0 % (ref 0–1)
EOS ABS: 0.1 10*3/uL (ref 0.0–0.7)
Eosinophils Relative: 2 % (ref 0–5)
LYMPHS ABS: 2.2 10*3/uL (ref 0.7–4.0)
Lymphocytes Relative: 39 % (ref 12–46)
MONOS PCT: 11 % (ref 3–12)
Monocytes Absolute: 0.6 10*3/uL (ref 0.1–1.0)
Neutro Abs: 2.6 10*3/uL (ref 1.7–7.7)
Neutrophils Relative %: 48 % (ref 43–77)

## 2013-10-25 LAB — CBC
HEMATOCRIT: 37.5 % — AB (ref 39.0–52.0)
HEMOGLOBIN: 13 g/dL (ref 13.0–17.0)
MCH: 31.4 pg (ref 26.0–34.0)
MCHC: 34.7 g/dL (ref 30.0–36.0)
MCV: 90.6 fL (ref 78.0–100.0)
Platelets: 153 10*3/uL (ref 150–400)
RBC: 4.14 MIL/uL — ABNORMAL LOW (ref 4.22–5.81)
RDW: 13.4 % (ref 11.5–15.5)
WBC: 5.5 10*3/uL (ref 4.0–10.5)

## 2013-10-25 LAB — COMPREHENSIVE METABOLIC PANEL
ALT: 24 U/L (ref 0–53)
AST: 16 U/L (ref 0–37)
Albumin: 3.7 g/dL (ref 3.5–5.2)
Alkaline Phosphatase: 85 U/L (ref 39–117)
BUN: 11 mg/dL (ref 6–23)
CALCIUM: 9.1 mg/dL (ref 8.4–10.5)
CO2: 30 mEq/L (ref 19–32)
Chloride: 102 mEq/L (ref 96–112)
Creatinine, Ser: 0.77 mg/dL (ref 0.50–1.35)
GFR calc Af Amer: >90 mL/min (ref >90)
GLUCOSE: 127 mg/dL — AB (ref 70–99)
Potassium: 4.2 mEq/L (ref 3.7–5.3)
Sodium: 141 mEq/L (ref 137–147)
Total Bilirubin: 0.3 mg/dL (ref 0.3–1.2)
Total Protein: 6.9 g/dL (ref 6.0–8.3)

## 2013-10-25 LAB — APTT: aPTT: 29 seconds (ref 24–37)

## 2013-10-25 LAB — I-STAT TROPONIN, ED: Troponin i, poc: 0 ng/mL (ref 0.00–0.08)

## 2013-10-25 LAB — PROTIME-INR
INR: 0.98 (ref 0.00–1.49)
Prothrombin Time: 12.8 seconds (ref 11.6–15.2)

## 2013-10-25 MED ORDER — HYDROCODONE-ACETAMINOPHEN 5-325 MG PO TABS
1.0000 | ORAL_TABLET | Freq: Once | ORAL | Status: AC
  Administered 2013-10-25: 1 via ORAL
  Filled 2013-10-25: qty 1

## 2013-10-25 NOTE — Discharge Instructions (Signed)

## 2013-10-25 NOTE — ED Notes (Addendum)
Pt sent by Dr Legrand Rams, was at office  For eval preop for neck surgery.   Dizziness for "long time"  Has been on metformin   cbg was 130 at md office  . Fax from office says pre syncope sx

## 2013-10-25 NOTE — ED Provider Notes (Signed)
CSN: [id number]     Arrival date & time 10/25/13  1224 History   First MD Initiated Contact with Patient 10/25/13 1302     Chief Complaint  Patient presents with  . Dizziness    HPI Pt is getting ready to have spine surgery.  He went to his doctor's office today for a medical clearance prior to his surgery.   Pt has been feeling dizzy.  When he stands up too fast he feels the symptoms.  This has been ongoing on for 6 years.  He started taking metformin about a month ago and felt like his symptoms became more severe.  It was worse about one week ago where he had a spell that lasted for 3 days where he was just staying in bed.  He felt like his balance was off and he was bouncing off the walls. No trouble with speech or vision.  He stopped taking the metformin and it got better after a few days.  He denies any of those symptoms now.  He does have pain in his neck and back and that is why he was seeing the spine surgeon, Dr Saintclair Halsted. Past Medical History  Diagnosis Date  . Type 2 diabetes mellitus   . Essential hypertension, benign   . Carotid artery disease   . Closed head injury 1996  . Polysubstance abuse     History of cocaine  . Cervical spondylosis   . History of colonic polyps   . History of seizures   . History of TIAs    Past Surgical History  Procedure Laterality Date  . Left carotid endarterectomy      6/06 - Dr. Scot Dock  . Tibia/fibula fracture repair    . Hand laceration repair     Family History  Problem Relation Age of Onset  . Cancer Father   . Coronary artery disease Mother    History  Substance Use Topics  . Smoking status: Current Every Day Smoker -- 2.00 packs/day for 55 years    Types: Cigarettes  . Smokeless tobacco: Never Used     Comment: smokes 1/2 pack per day now  . Alcohol Use: Yes    Review of Systems  Musculoskeletal: Positive for back pain and neck pain.  All other systems reviewed and are negative.     Allergies  Metformin and related  Home  Medications   Prior to Admission medications   Medication Sig Start Date End Date Taking? Authorizing Provider  amLODipine (NORVASC) 5 MG tablet Take 5 mg by mouth daily.   Yes Historical Provider, MD  aspirin EC 81 MG tablet Take 81 mg by mouth daily.   Yes Historical Provider, MD  cyclobenzaprine (FLEXERIL) 10 MG tablet Take 10 mg by mouth 2 (two) times daily.   Yes Historical Provider, MD  losartan-hydrochlorothiazide (HYZAAR) 50-12.5 MG per tablet Take 1 tablet by mouth daily.   Yes Historical Provider, MD  metFORMIN (GLUCOPHAGE) 500 MG tablet Take 500 mg by mouth 2 (two) times daily with a meal.    Historical Provider, MD   BP 170/87  Pulse 63  Temp(Src) 97.7 F (36.5 C) (Oral)  Resp 18  SpO2 100% Physical Exam  Nursing note and vitals reviewed. Constitutional: He is oriented to person, place, and time. He appears well-developed and well-nourished. No distress.  HENT:  Head: Normocephalic and atraumatic.  Right Ear: External ear normal.  Left Ear: External ear normal.  Mouth/Throat: Oropharynx is clear and moist.  Eyes: Conjunctivae are normal.  Right eye exhibits no discharge. Left eye exhibits no discharge. No scleral icterus.  Neck: Neck supple. No tracheal deviation present.  Cardiovascular: Normal rate, regular rhythm and intact distal pulses.   Pulmonary/Chest: Effort normal and breath sounds normal. No stridor. No respiratory distress. He has no wheezes. He has no rales.  Abdominal: Soft. Bowel sounds are normal. He exhibits no distension. There is no tenderness. There is no rebound and no guarding.  Musculoskeletal: He exhibits no edema and no tenderness.  Pain in back with lifting legs off the bed  Neurological: He is alert and oriented to person, place, and time. He has normal strength. No cranial nerve deficit (no facial droop, extraocular movements intact, no slurred speech) or sensory deficit. He exhibits normal muscle tone. He displays no seizure activity.  Coordination normal.  No pronator drift bilateral upper extrem, able to hold both legs off bed for 5 seconds, sensation intact in all extremities, no visual field cuts, no left or right sided neglect, normal finger-nose exam bilaterally, no nystagmus noted   Skin: Skin is warm and dry. No rash noted.  Psychiatric: He has a normal mood and affect.    ED Course  Procedures (including critical care time) Labs Review Labs Reviewed  CBC - Abnormal; Notable for the following:    RBC 4.14 (*)    HCT 37.5 (*)    All other components within normal limits  COMPREHENSIVE METABOLIC PANEL - Abnormal; Notable for the following:    Glucose, Bld 127 (*)    All other components within normal limits  I-STAT CHEM 8, ED - Abnormal; Notable for the following:    Glucose, Bld 121 (*)    All other components within normal limits  PROTIME-INR  APTT  DIFFERENTIAL  I-STAT TROPOININ, ED  I-STAT TROPOININ, ED    Imaging Review Ct Head Wo Contrast  10/25/2013   CLINICAL DATA:  Dizziness  EXAM: CT HEAD WITHOUT CONTRAST  TECHNIQUE: Contiguous axial images were obtained from the base of the skull through the vertex without intravenous contrast.  COMPARISON:  09/12/2011  FINDINGS: The bony calvarium is intact. The ventricles are of normal size and configuration. No findings to suggest acute hemorrhage, acute infarction or space-occupying mass lesion are noted.  IMPRESSION: No acute abnormality is noted.   Electronically Signed   By: Inez Catalina M.D.   On: 10/25/2013 14:08     EKG Interpretation   Date/Time:  Tuesday October 25 2013 12:47:41 EDT Ventricular Rate:  60 PR Interval:  138 QRS Duration: 101 QT Interval:  391 QTC Calculation: 391 R Axis:   9 Text Interpretation:  Sinus rhythm RSR' in V1 or V2, probably normal  variant No significant change since  28 Oct 2004 Confirmed by Dalisha Shively  MD-J,  Shia Delaine ([id number]) on 10/25/2013 1:05:34 PM      MDM   Final diagnoses:  Dizziness    Pt has not had any symptoms  for days.  He has no complaints.  Neuro exam normal.  CT, labs, EKG are unremarkable.  Could have had TIAs but with the chronicity of his symptoms do not feel that admission for expedited workup is necessary at this time.  At this time there does not appear to be any evidence of an acute emergency medical condition and the patient appears stable for discharge with appropriate outpatient follow up.     Dorie Rank, MD 10/25/13 [phone]

## 2013-10-25 NOTE — ED Notes (Signed)
Medication for neck pain given at discharge per Dr. Johnsie Kindred order. Pt A&O and in NAD. Pt accompanied by family to home.

## 2013-11-21 ENCOUNTER — Other Ambulatory Visit: Payer: Self-pay | Admitting: Neurosurgery

## 2013-11-25 ENCOUNTER — Encounter (HOSPITAL_COMMUNITY): Payer: Self-pay | Admitting: Pharmacy Technician

## 2013-11-30 ENCOUNTER — Ambulatory Visit (HOSPITAL_COMMUNITY)
Admission: RE | Admit: 2013-11-30 | Discharge: 2013-11-30 | Disposition: A | Discharge status: Home / Self Care | Payer: Medicare Other | Source: Ambulatory Visit | Attending: Anesthesiology | Admitting: Anesthesiology

## 2013-11-30 ENCOUNTER — Encounter (HOSPITAL_COMMUNITY): Payer: Self-pay

## 2013-11-30 ENCOUNTER — Encounter (HOSPITAL_COMMUNITY)
Admission: RE | Admit: 2013-11-30 | Discharge: 2013-11-30 | Disposition: A | Discharge status: Home / Self Care | Payer: Medicare Other | Source: Ambulatory Visit | Attending: Neurosurgery | Admitting: Neurosurgery

## 2013-11-30 DIAGNOSIS — E119 Type 2 diabetes mellitus without complications: Secondary | ICD-10-CM | POA: Insufficient documentation

## 2013-11-30 DIAGNOSIS — F17200 Nicotine dependence, unspecified, uncomplicated: Secondary | ICD-10-CM | POA: Insufficient documentation

## 2013-11-30 DIAGNOSIS — I10 Essential (primary) hypertension: Secondary | ICD-10-CM | POA: Insufficient documentation

## 2013-11-30 DIAGNOSIS — Z01811 Encounter for preprocedural respiratory examination: Secondary | ICD-10-CM | POA: Insufficient documentation

## 2013-11-30 LAB — BASIC METABOLIC PANEL
Anion gap: 13 (ref 5–15)
BUN: 12 mg/dL (ref 6–23)
CALCIUM: 9.7 mg/dL (ref 8.4–10.5)
CO2: 29 mEq/L (ref 19–32)
Chloride: 98 mEq/L (ref 96–112)
Creatinine, Ser: 0.7 mg/dL (ref 0.50–1.35)
Glucose, Bld: 126 mg/dL — ABNORMAL HIGH (ref 70–99)
POTASSIUM: 4.4 meq/L (ref 3.7–5.3)
Sodium: 140 mEq/L (ref 137–147)

## 2013-11-30 LAB — CBC
HCT: 42.6 % (ref 39.0–52.0)
Hemoglobin: 14.7 g/dL (ref 13.0–17.0)
MCH: 32 pg (ref 26.0–34.0)
MCHC: 34.5 g/dL (ref 30.0–36.0)
MCV: 92.6 fL (ref 78.0–100.0)
PLATELETS: 161 10*3/uL (ref 150–400)
RBC: 4.6 MIL/uL (ref 4.22–5.81)
RDW: 13.7 % (ref 11.5–15.5)
WBC: 8 10*3/uL (ref 4.0–10.5)

## 2013-11-30 LAB — SURGICAL PCR SCREEN
MRSA, PCR: NEGATIVE
Staphylococcus aureus: POSITIVE — AB

## 2013-11-30 NOTE — Pre-Procedure Instructions (Signed)
[facility]  11/30/2013   Your procedure is scheduled on:  Monday, July 13  Report to [facility] Admitting at 1200 PM.  Call this number if you have problems the morning of surgery: [phone]   Remember:   Do not eat food or drink liquids after midnight.Sunday night   Take these medicines the morning of surgery with A SIP OF WATER: amlodipine (Norvasc  5 mg ) , Flexeril   Do not wear jewelry.  Do not wear lotions, powders, or perfumes. You may wear deodorant.  Do not shave 48 hours prior to surgery. Men may shave face and neck.  Do not bring valuables to the hospital.  [facility] is not responsible   for any belongings or valuables.               Contacts, dentures or bridgework may not be worn into surgery.  Leave suitcase in the car. After surgery it may be brought to your room.  For patients admitted to the hospital, discharge time is determined by your                treatment team.               Patients discharged the day of surgery will not be allowed to drive  home.  Name and phone number of your driver: family  Special Instructions: [facility] - Preparing for Surgery  Before surgery, you can play an important role.  Because skin is not sterile, your skin needs to be as free of germs as possible.  You can reduce the number of germs on you skin by washing with CHG (chlorahexidine gluconate) soap before surgery.  CHG is an antiseptic cleaner which kills germs and bonds with the skin to continue killing germs even after washing.  Please DO NOT use if you have an allergy to CHG or antibacterial soaps.  If your skin becomes reddened/irritated stop using the CHG and inform your nurse when you arrive at Short Stay.  Do not shave (including legs and underarms) for at least 48 hours prior to the first CHG shower.  You may shave your face.  Please follow these instructions carefully:   1.  Shower with CHG Soap the night before surgery and the   morning of  Surgery.  2.  If you choose to wash your hair, wash your hair first as usual with your  normal shampoo.  3.  After you shampoo, rinse your hair and body thoroughly to remove the   Shampoo.  4.  Use CHG as you would any other liquid soap.  You can apply chg directly  to the skin and wash gently with scrungie or a clean washcloth.  5.  Apply the CHG Soap to your body ONLY FROM THE NECK DOWN.   Do not use on open wounds or open sores.  Avoid contact with your eyes,   ears, mouth and genitals (private parts).  Wash genitals (private parts)    with your normal soap.  6.  Wash thoroughly, paying special attention to the area where your surgery   will be performed.  7.  Thoroughly rinse your body with warm water from the neck down.  8.  DO NOT shower/wash with your normal soap after using and rinsing off    the CHG Soap.  9.  Pat yourself dry with a clean towel.            10 .  Wear clean  pajamas.            11.  Place clean sheets on your bed the night of your first shower and do not  sleep with pets.  Day of Surgery  Do not apply any lotions/deoderants the morning of surgery.  Please wear clean clothes to the hospital/surgery center.     Please read over the following fact sheets that you were given: Pain Booklet, Coughing and Deep Breathing and Surgical Site Infection Prevention

## 2013-12-01 NOTE — Progress Notes (Signed)
Anesthesia Chart Review:  Patient is a 63 year old male scheduled for C4-5, C5-6 ACDF on 12/05/13 by Dr. Saintclair Halsted.  History includes smoking, HTN, polysubstance abuse, DM2, TIA, seizures, closed head injury '96, left carotid endarterectomy '06. PCP is Dr. Rosita Fire who medically cleared him for surgery.    EKG on 10/25/13 showed SR, RSR prime in V1 or V2, probably normal variant.  Nuclear stress test on 10/06/11 [facility]) showed: Probably normal LV perfusion. No chest pain or ECG changes consistent with ischemia. Global LV systolic function is normal, EF 58%. Normal LV wall motion. Small, partially reversible apical- defect assocated with normal wall motion consistent with attenuation artifact.  Doubt small area represents ischemia.  Report was reviewed by cardiologist Dr. Rozann Lesches who felt "Overall relatively low risk study, arguing against any major degree of obstructive CAD. As indicated in my office visit, would recommend aggressive risk factor modification strategies and followup with Dr. Legrand Rams. We can see him back as needed."  Echo on 10/01/11 showed: - Left ventricle: The cavity size was normal. Wall thickness was increased in a pattern of mild LVH. There was mild concentric hypertrophy. Systolic function was normal. The estimated ejection fraction was in the range of 55% to 60%. Wall motion was normal; there were no regional wall motion abnormalities. Doppler parameters are consistent with abnormal left ventricular relaxation (grade 1 diastolic dysfunction). - Aortic valve: Valve area: 2.5cm^2(VTI). Valve area: 2.69cm^2 (Vmax). - Mitral valve: Calcification, with moderate involvement of anterior leaflet chords. - Atrial septum: No defect or patent foramen ovale was identified. Impressions: Normal pulmonary artery pressure.  Preoperative CXR and labs noted. LFTs were WNL on 10/25/13.    If no acute changes then I anticipate that he can proceed as planned.  George Hugh [facility] Short Stay Center/Anesthesiology Phone [phone] 12/01/2013 5:55 PM

## 2013-12-01 NOTE — Progress Notes (Signed)
Called in Mupirocin Ointment Rx to [facility] in [place] for positive PCR of staph. Tried numerous times to call pt, went to voicemail each time. Left message on voicemail notifying him of results.

## 2013-12-02 NOTE — Progress Notes (Signed)
Called and left message on pt's phone with surgery time change. Instructed pt to arrive here at 7:40 AM on Monday, 12/05/13.  Called Ruthell Rummage (emergency contact) and was able to talk with her and gave her the information and she states they will be here at 7:40 AM.

## 2013-12-04 MED ORDER — DEXAMETHASONE SODIUM PHOSPHATE 10 MG/ML IJ SOLN
10.0000 mg | INTRAMUSCULAR | Status: AC
  Administered 2013-12-05: 10 mg via INTRAVENOUS
  Filled 2013-12-04: qty 1

## 2013-12-04 MED ORDER — CEFAZOLIN SODIUM-DEXTROSE 2-3 GM-% IV SOLR
2.0000 g | INTRAVENOUS | Status: AC
  Administered 2013-12-05: 2 g via INTRAVENOUS
  Filled 2013-12-04: qty 50

## 2013-12-05 ENCOUNTER — Encounter (HOSPITAL_COMMUNITY)
Admission: RE | Disposition: A | Discharge status: Home / Self Care | Payer: Self-pay | Source: Ambulatory Visit | Attending: Neurosurgery

## 2013-12-05 ENCOUNTER — Observation Stay (HOSPITAL_COMMUNITY)
Admission: RE | Admit: 2013-12-05 | Discharge: 2013-12-05 | Disposition: A | Discharge status: Home / Self Care | Payer: Medicare Other | Source: Ambulatory Visit | Attending: Neurosurgery | Admitting: Neurosurgery

## 2013-12-05 ENCOUNTER — Encounter (HOSPITAL_COMMUNITY): Payer: Medicare Other | Admitting: Vascular Surgery

## 2013-12-05 ENCOUNTER — Ambulatory Visit (HOSPITAL_COMMUNITY): Payer: Medicare Other

## 2013-12-05 ENCOUNTER — Encounter (HOSPITAL_COMMUNITY): Payer: Self-pay | Admitting: Certified Registered Nurse Anesthetist

## 2013-12-05 ENCOUNTER — Inpatient Hospital Stay (HOSPITAL_COMMUNITY): Payer: Medicare Other | Admitting: Certified Registered Nurse Anesthetist

## 2013-12-05 DIAGNOSIS — F17200 Nicotine dependence, unspecified, uncomplicated: Secondary | ICD-10-CM | POA: Insufficient documentation

## 2013-12-05 DIAGNOSIS — E119 Type 2 diabetes mellitus without complications: Secondary | ICD-10-CM | POA: Insufficient documentation

## 2013-12-05 DIAGNOSIS — I10 Essential (primary) hypertension: Secondary | ICD-10-CM | POA: Insufficient documentation

## 2013-12-05 DIAGNOSIS — F1210 Cannabis abuse, uncomplicated: Secondary | ICD-10-CM | POA: Insufficient documentation

## 2013-12-05 DIAGNOSIS — Z8673 Personal history of transient ischemic attack (TIA), and cerebral infarction without residual deficits: Secondary | ICD-10-CM | POA: Insufficient documentation

## 2013-12-05 DIAGNOSIS — M4802 Spinal stenosis, cervical region: Secondary | ICD-10-CM | POA: Diagnosis present

## 2013-12-05 DIAGNOSIS — Z79899 Other long term (current) drug therapy: Secondary | ICD-10-CM | POA: Insufficient documentation

## 2013-12-05 DIAGNOSIS — Z860100 Personal history of colon polyps, unspecified: Secondary | ICD-10-CM | POA: Insufficient documentation

## 2013-12-05 DIAGNOSIS — F1411 Cocaine abuse, in remission: Secondary | ICD-10-CM | POA: Insufficient documentation

## 2013-12-05 DIAGNOSIS — M4712 Other spondylosis with myelopathy, cervical region: Principal | ICD-10-CM | POA: Insufficient documentation

## 2013-12-05 DIAGNOSIS — I6529 Occlusion and stenosis of unspecified carotid artery: Secondary | ICD-10-CM | POA: Insufficient documentation

## 2013-12-05 DIAGNOSIS — I739 Peripheral vascular disease, unspecified: Secondary | ICD-10-CM | POA: Insufficient documentation

## 2013-12-05 HISTORY — PX: ANTERIOR CERVICAL DECOMP/DISCECTOMY FUSION: SHX1161

## 2013-12-05 LAB — GLUCOSE, CAPILLARY
Glucose-Capillary: 140 mg/dL — ABNORMAL HIGH (ref 70–99)
Glucose-Capillary: 182 mg/dL — ABNORMAL HIGH (ref 70–99)

## 2013-12-05 SURGERY — ANTERIOR CERVICAL DECOMPRESSION/DISCECTOMY FUSION 2 LEVELS
Anesthesia: General | Site: Neck

## 2013-12-05 MED ORDER — SODIUM CHLORIDE 0.9 % IJ SOLN: 3.0000 mL | Freq: Two times a day (BID) | INTRAMUSCULAR | Status: DC

## 2013-12-05 MED ORDER — GLYCOPYRROLATE 0.2 MG/ML IJ SOLN
INTRAMUSCULAR | Status: AC
  Filled 2013-12-05: qty 3

## 2013-12-05 MED ORDER — ONDANSETRON HCL 4 MG/2ML IJ SOLN
INTRAMUSCULAR | Status: DC | PRN
  Administered 2013-12-05: 4 mg via INTRAVENOUS

## 2013-12-05 MED ORDER — HEMOSTATIC AGENTS (NO CHARGE) OPTIME
TOPICAL | Status: DC | PRN
  Administered 2013-12-05: 1 via TOPICAL

## 2013-12-05 MED ORDER — FENTANYL CITRATE 0.05 MG/ML IJ SOLN
INTRAMUSCULAR | Status: DC | PRN
  Administered 2013-12-05 (×3): 50 ug via INTRAVENOUS
  Administered 2013-12-05: 150 ug via INTRAVENOUS
  Administered 2013-12-05 (×3): 50 ug via INTRAVENOUS

## 2013-12-05 MED ORDER — MENTHOL 3 MG MT LOZG: 1.0000 | LOZENGE | OROMUCOSAL | Status: DC | PRN

## 2013-12-05 MED ORDER — ALUM & MAG HYDROXIDE-SIMETH 200-200-20 MG/5ML PO SUSP: 30.0000 mL | Freq: Four times a day (QID) | ORAL | Status: DC | PRN

## 2013-12-05 MED ORDER — HYDROMORPHONE HCL PF 1 MG/ML IJ SOLN
INTRAMUSCULAR | Status: AC
  Filled 2013-12-05: qty 1

## 2013-12-05 MED ORDER — ACETAMINOPHEN 325 MG PO TABS: 650.0000 mg | ORAL_TABLET | ORAL | Status: DC | PRN

## 2013-12-05 MED ORDER — ARTIFICIAL TEARS OP OINT
TOPICAL_OINTMENT | OPHTHALMIC | Status: DC | PRN
  Administered 2013-12-05: 1 via OPHTHALMIC

## 2013-12-05 MED ORDER — ARTIFICIAL TEARS OP OINT
TOPICAL_OINTMENT | OPHTHALMIC | Status: AC
  Filled 2013-12-05: qty 3.5

## 2013-12-05 MED ORDER — ONDANSETRON HCL 4 MG/2ML IJ SOLN: 4.0000 mg | INTRAMUSCULAR | Status: DC | PRN

## 2013-12-05 MED ORDER — PROPOFOL 10 MG/ML IV BOLUS
INTRAVENOUS | Status: DC | PRN
  Administered 2013-12-05: 200 mg via INTRAVENOUS

## 2013-12-05 MED ORDER — ROCURONIUM BROMIDE 100 MG/10ML IV SOLN
INTRAVENOUS | Status: DC | PRN
  Administered 2013-12-05: 50 mg via INTRAVENOUS

## 2013-12-05 MED ORDER — GLYCOPYRROLATE 0.2 MG/ML IJ SOLN
INTRAMUSCULAR | Status: DC | PRN
  Administered 2013-12-05: 0.6 mg via INTRAVENOUS

## 2013-12-05 MED ORDER — AMLODIPINE BESYLATE 5 MG PO TABS: 5.0000 mg | ORAL_TABLET | Freq: Every day | ORAL | Status: DC

## 2013-12-05 MED ORDER — STERILE WATER FOR INJECTION IJ SOLN
INTRAMUSCULAR | Status: AC
  Filled 2013-12-05: qty 10

## 2013-12-05 MED ORDER — LOSARTAN POTASSIUM 50 MG PO TABS
50.0000 mg | ORAL_TABLET | Freq: Every day | ORAL | Status: DC
  Administered 2013-12-05: 50 mg via ORAL
  Filled 2013-12-05: qty 1

## 2013-12-05 MED ORDER — NEOSTIGMINE METHYLSULFATE 10 MG/10ML IV SOLN
INTRAVENOUS | Status: AC
  Filled 2013-12-05: qty 1

## 2013-12-05 MED ORDER — HYDROMORPHONE HCL PF 1 MG/ML IJ SOLN
0.5000 mg | INTRAMUSCULAR | Status: DC | PRN
  Administered 2013-12-05: 0.5 mg via INTRAVENOUS

## 2013-12-05 MED ORDER — MIDAZOLAM HCL 2 MG/2ML IJ SOLN
INTRAMUSCULAR | Status: AC
  Filled 2013-12-05: qty 2

## 2013-12-05 MED ORDER — ACETAMINOPHEN 650 MG RE SUPP: 650.0000 mg | RECTAL | Status: DC | PRN

## 2013-12-05 MED ORDER — PROPOFOL 10 MG/ML IV BOLUS
INTRAVENOUS | Status: AC
  Filled 2013-12-05: qty 20

## 2013-12-05 MED ORDER — MUPIROCIN 2 % EX OINT
1.0000 "application " | TOPICAL_OINTMENT | Freq: Two times a day (BID) | CUTANEOUS | Status: DC
  Administered 2013-12-05: 1 via NASAL

## 2013-12-05 MED ORDER — OXYCODONE-ACETAMINOPHEN 5-325 MG PO TABS: 1.0000 | ORAL_TABLET | ORAL | Status: DC | PRN

## 2013-12-05 MED ORDER — HYDROMORPHONE HCL PF 1 MG/ML IJ SOLN
INTRAMUSCULAR | Status: AC
  Administered 2013-12-05: 0.5 mg via INTRAVENOUS
  Filled 2013-12-05: qty 1

## 2013-12-05 MED ORDER — CYCLOBENZAPRINE HCL 10 MG PO TABS
10.0000 mg | ORAL_TABLET | Freq: Two times a day (BID) | ORAL | Status: DC
  Administered 2013-12-05: 10 mg via ORAL

## 2013-12-05 MED ORDER — SODIUM CHLORIDE 0.9 % IJ SOLN: 3.0000 mL | INTRAMUSCULAR | Status: DC | PRN

## 2013-12-05 MED ORDER — OXYCODONE-ACETAMINOPHEN 5-325 MG PO TABS
ORAL_TABLET | ORAL | Status: AC
  Filled 2013-12-05: qty 2

## 2013-12-05 MED ORDER — ROCURONIUM BROMIDE 50 MG/5ML IV SOLN
INTRAVENOUS | Status: AC
  Filled 2013-12-05: qty 1

## 2013-12-05 MED ORDER — 0.9 % SODIUM CHLORIDE (POUR BTL) OPTIME
TOPICAL | Status: DC | PRN
  Administered 2013-12-05: 1000 mL

## 2013-12-05 MED ORDER — OXYCODONE-ACETAMINOPHEN 5-325 MG PO TABS
1.0000 | ORAL_TABLET | ORAL | Status: DC | PRN
  Administered 2013-12-05: 2 via ORAL
  Filled 2013-12-05: qty 2

## 2013-12-05 MED ORDER — HYDROMORPHONE HCL PF 1 MG/ML IJ SOLN: 0.5000 mg | INTRAMUSCULAR | Status: DC | PRN

## 2013-12-05 MED ORDER — SODIUM CHLORIDE 0.9 % IV SOLN: 250.0000 mL | INTRAVENOUS | Status: DC

## 2013-12-05 MED ORDER — ONDANSETRON HCL 4 MG/2ML IJ SOLN
INTRAMUSCULAR | Status: AC
  Filled 2013-12-05: qty 2

## 2013-12-05 MED ORDER — CYCLOBENZAPRINE HCL 10 MG PO TABS: 10.0000 mg | ORAL_TABLET | Freq: Three times a day (TID) | ORAL | Status: DC | PRN

## 2013-12-05 MED ORDER — EPHEDRINE SULFATE 50 MG/ML IJ SOLN
INTRAMUSCULAR | Status: AC
  Filled 2013-12-05: qty 1

## 2013-12-05 MED ORDER — HYDROMORPHONE HCL PF 1 MG/ML IJ SOLN
0.2500 mg | INTRAMUSCULAR | Status: DC | PRN
  Administered 2013-12-05 (×3): 0.5 mg via INTRAVENOUS
  Administered 2013-12-05: 14:00:00 via INTRAVENOUS

## 2013-12-05 MED ORDER — DOCUSATE SODIUM 100 MG PO CAPS
100.0000 mg | ORAL_CAPSULE | Freq: Two times a day (BID) | ORAL | Status: DC
  Filled 2013-12-05: qty 1

## 2013-12-05 MED ORDER — LACTATED RINGERS IV SOLN
INTRAVENOUS | Status: DC
  Administered 2013-12-05 (×3): via INTRAVENOUS

## 2013-12-05 MED ORDER — VECURONIUM BROMIDE 10 MG IV SOLR
INTRAVENOUS | Status: AC
  Filled 2013-12-05: qty 10

## 2013-12-05 MED ORDER — HYDROCHLOROTHIAZIDE 12.5 MG PO CAPS
12.5000 mg | ORAL_CAPSULE | Freq: Every day | ORAL | Status: DC
  Administered 2013-12-05: 12.5 mg via ORAL
  Filled 2013-12-05: qty 1

## 2013-12-05 MED ORDER — CYCLOBENZAPRINE HCL 10 MG PO TABS
ORAL_TABLET | ORAL | Status: AC
  Filled 2013-12-05: qty 1

## 2013-12-05 MED ORDER — PHENYLEPHRINE HCL 10 MG/ML IJ SOLN
INTRAMUSCULAR | Status: DC | PRN
  Administered 2013-12-05 (×3): 40 ug via INTRAVENOUS

## 2013-12-05 MED ORDER — PHENYLEPHRINE 40 MCG/ML (10ML) SYRINGE FOR IV PUSH (FOR BLOOD PRESSURE SUPPORT)
PREFILLED_SYRINGE | INTRAVENOUS | Status: AC
  Filled 2013-12-05: qty 10

## 2013-12-05 MED ORDER — LOSARTAN POTASSIUM-HCTZ 50-12.5 MG PO TABS: 1.0000 | ORAL_TABLET | Freq: Every day | ORAL | Status: DC

## 2013-12-05 MED ORDER — PHENOL 1.4 % MT LIQD: 1.0000 | OROMUCOSAL | Status: DC | PRN

## 2013-12-05 MED ORDER — CEFAZOLIN SODIUM 1-5 GM-% IV SOLN
1.0000 g | Freq: Three times a day (TID) | INTRAVENOUS | Status: DC
  Administered 2013-12-05: 1 g via INTRAVENOUS
  Filled 2013-12-05: qty 50

## 2013-12-05 MED ORDER — FENTANYL CITRATE 0.05 MG/ML IJ SOLN
INTRAMUSCULAR | Status: AC
  Filled 2013-12-05: qty 5

## 2013-12-05 MED ORDER — SODIUM CHLORIDE 0.9 % IR SOLN
Status: DC | PRN
  Administered 2013-12-05: 10:00:00

## 2013-12-05 MED ORDER — CYCLOBENZAPRINE HCL 10 MG PO TABS: 10.0000 mg | ORAL_TABLET | Freq: Two times a day (BID) | ORAL | Status: DC

## 2013-12-05 MED ORDER — METFORMIN HCL 500 MG PO TABS
500.0000 mg | ORAL_TABLET | Freq: Two times a day (BID) | ORAL | Status: DC
  Filled 2013-12-05 (×2): qty 1

## 2013-12-05 MED ORDER — ONDANSETRON HCL 4 MG/2ML IJ SOLN: 4.0000 mg | Freq: Once | INTRAMUSCULAR | Status: DC | PRN

## 2013-12-05 MED ORDER — VECURONIUM BROMIDE 10 MG IV SOLR
INTRAVENOUS | Status: DC | PRN
  Administered 2013-12-05 (×2): 2 mg via INTRAVENOUS

## 2013-12-05 MED ORDER — LIDOCAINE HCL (CARDIAC) 20 MG/ML IV SOLN
INTRAVENOUS | Status: AC
  Filled 2013-12-05: qty 10

## 2013-12-05 MED ORDER — NEOSTIGMINE METHYLSULFATE 10 MG/10ML IV SOLN
INTRAVENOUS | Status: DC | PRN
  Administered 2013-12-05: 4 mg via INTRAVENOUS

## 2013-12-05 MED ORDER — HEMOSTATIC AGENTS (NO CHARGE) OPTIME
TOPICAL | Status: DC | PRN
  Administered 2013-12-05: 3 via TOPICAL

## 2013-12-05 MED ORDER — LIDOCAINE HCL (CARDIAC) 20 MG/ML IV SOLN
INTRAVENOUS | Status: DC | PRN
  Administered 2013-12-05: 60 mg via INTRATRACHEAL
  Administered 2013-12-05: 80 mg via INTRAVENOUS

## 2013-12-05 MED ORDER — MIDAZOLAM HCL 5 MG/5ML IJ SOLN
INTRAMUSCULAR | Status: DC | PRN
  Administered 2013-12-05: 2 mg via INTRAVENOUS

## 2013-12-05 SURGICAL SUPPLY — 63 items
ADH SKN CLS APL DERMABOND .7 (GAUZE/BANDAGES/DRESSINGS)
APL SKNCLS STERI-STRIP NONHPOA (GAUZE/BANDAGES/DRESSINGS) ×1
BAG DECANTER FOR FLEXI CONT (MISCELLANEOUS) ×2 IMPLANT
BENZOIN TINCTURE PRP APPL 2/3 (GAUZE/BANDAGES/DRESSINGS) ×2 IMPLANT
BIT DRILL SM SPINE QC 14 (BIT) ×1 IMPLANT
BLADE 10 SAFETY STRL DISP (BLADE) ×1 IMPLANT
BRUSH SCRUB EZ PLAIN DRY (MISCELLANEOUS) ×2 IMPLANT
BUR MATCHSTICK NEURO 3.0 LAGG (BURR) ×2 IMPLANT
CANISTER SUCT 3000ML (MISCELLANEOUS) ×2 IMPLANT
CONT SPEC 4OZ CLIKSEAL STRL BL (MISCELLANEOUS) ×2 IMPLANT
DERMABOND ADVANCED (GAUZE/BANDAGES/DRESSINGS)
DERMABOND ADVANCED .7 DNX12 (GAUZE/BANDAGES/DRESSINGS) IMPLANT
DRAPE C-ARM 42X72 X-RAY (DRAPES) ×4 IMPLANT
DRAPE LAPAROTOMY 100X72 PEDS (DRAPES) ×2 IMPLANT
DRAPE MICROSCOPE ZEISS OPMI (DRAPES) ×2 IMPLANT
DRAPE POUCH INSTRU U-SHP 10X18 (DRAPES) ×2 IMPLANT
DRSG OPSITE POSTOP 4X6 (GAUZE/BANDAGES/DRESSINGS) ×2 IMPLANT
DURAPREP 6ML APPLICATOR 50/CS (WOUND CARE) ×2 IMPLANT
ELECT COATED BLADE 2.86 ST (ELECTRODE) ×2 IMPLANT
ELECT REM PT RETURN 9FT ADLT (ELECTROSURGICAL) ×2
ELECTRODE REM PT RTRN 9FT ADLT (ELECTROSURGICAL) ×1 IMPLANT
GAUZE SPONGE 4X4 16PLY XRAY LF (GAUZE/BANDAGES/DRESSINGS) IMPLANT
GLOVE BIO SURGEON STRL SZ8 (GLOVE) ×2 IMPLANT
GLOVE BIOGEL PI IND STRL 7.5 (GLOVE) IMPLANT
GLOVE BIOGEL PI INDICATOR 7.5 (GLOVE) ×2
GLOVE ECLIPSE 7.0 STRL STRAW (GLOVE) ×3 IMPLANT
GLOVE EXAM NITRILE LRG STRL (GLOVE) IMPLANT
GLOVE EXAM NITRILE MD LF STRL (GLOVE) IMPLANT
GLOVE EXAM NITRILE XL STR (GLOVE) IMPLANT
GLOVE EXAM NITRILE XS STR PU (GLOVE) IMPLANT
GLOVE INDICATOR 8.5 STRL (GLOVE) ×2 IMPLANT
GLOVE SURG SS PI 7.0 STRL IVOR (GLOVE) ×3 IMPLANT
GOWN STRL REUS W/ TWL LRG LVL3 (GOWN DISPOSABLE) IMPLANT
GOWN STRL REUS W/ TWL XL LVL3 (GOWN DISPOSABLE) ×1 IMPLANT
GOWN STRL REUS W/TWL 2XL LVL3 (GOWN DISPOSABLE) IMPLANT
GOWN STRL REUS W/TWL LRG LVL3 (GOWN DISPOSABLE) ×4
GOWN STRL REUS W/TWL XL LVL3 (GOWN DISPOSABLE) ×2
HALTER HD/CHIN CERV TRACTION D (MISCELLANEOUS) ×2 IMPLANT
HEMOSTAT POWDER KIT SURGIFOAM (HEMOSTASIS) ×2 IMPLANT
KIT BASIN OR (CUSTOM PROCEDURE TRAY) ×2 IMPLANT
KIT ROOM TURNOVER OR (KITS) ×2 IMPLANT
NDL SPNL 20GX3.5 QUINCKE YW (NEEDLE) ×1 IMPLANT
NEEDLE SPNL 20GX3.5 QUINCKE YW (NEEDLE) ×2 IMPLANT
NS IRRIG 1000ML POUR BTL (IV SOLUTION) ×2 IMPLANT
PACK LAMINECTOMY NEURO (CUSTOM PROCEDURE TRAY) ×2 IMPLANT
PAD ARMBOARD 7.5X6 YLW CONV (MISCELLANEOUS) ×6 IMPLANT
PLATE ANT CERV XTEND 2 LV 28 (Plate) ×1 IMPLANT
PUTTY BONE DBX 2.5 MIS (Bone Implant) ×1 IMPLANT
RUBBERBAND STERILE (MISCELLANEOUS) ×4 IMPLANT
SCREW XTD VAR 4.2 SELF TAP (Screw) ×6 IMPLANT
SPACER COLONIAL 7X14X12 (Spacer) ×2 IMPLANT
SPONGE GAUZE 4X4 12PLY (GAUZE/BANDAGES/DRESSINGS) ×1 IMPLANT
SPONGE INTESTINAL PEANUT (DISPOSABLE) ×2 IMPLANT
SPONGE SURGIFOAM ABS GEL SZ50 (HEMOSTASIS) ×2 IMPLANT
STRIP CLOSURE SKIN 1/2X4 (GAUZE/BANDAGES/DRESSINGS) ×2 IMPLANT
SUT VIC AB 3-0 SH 8-18 (SUTURE) ×2 IMPLANT
SUT VICRYL 4-0 PS2 18IN ABS (SUTURE) ×2 IMPLANT
SYR 20ML ECCENTRIC (SYRINGE) ×2 IMPLANT
TAPE CLOTH 4X10 WHT NS (GAUZE/BANDAGES/DRESSINGS) IMPLANT
TOWEL OR 17X24 6PK STRL BLUE (TOWEL DISPOSABLE) ×2 IMPLANT
TOWEL OR 17X26 10 PK STRL BLUE (TOWEL DISPOSABLE) ×2 IMPLANT
TRAP SPECIMEN MUCOUS 40CC (MISCELLANEOUS) ×2 IMPLANT
WATER STERILE IRR 1000ML POUR (IV SOLUTION) ×2 IMPLANT

## 2013-12-05 NOTE — Transfer of Care (Signed)
Immediate Anesthesia Transfer of Care Note  Patient: Ryan Butler  Procedure(s) Performed: Procedure(s) with comments: ANTERIOR CERVICAL DECOMPRESSION/DISCECTOMY FUSION 2 LEVELS (N/A) - Anterior Cervical Discectomy and Fusion with PEEK Cages and allograft Cervical Four-Five/Five-Six  Patient Location: PACU  Anesthesia Type:General  Level of Consciousness: awake, alert  and oriented  Airway & Oxygen Therapy: Patient Spontanous Breathing and Patient connected to nasal cannula oxygen  Post-op Assessment: Report given to PACU RN, Post -op Vital signs reviewed and stable and Patient moving all extremities X 4  Post vital signs: Reviewed and stable  Complications: No apparent anesthesia complications

## 2013-12-05 NOTE — H&P (Signed)
Ryan Butler is an 63 y.o. male.   Chief Complaint: Neck pain shoulder pain and weakness HPI: Patient is a 63 year old some is a long-standing neck pain and back pain with pain into both shoulders both hands as well as and his legs. He has significant weakness in his hands and clumsiness and difficulty walking. Workup revealed severe cervical stenosis spondylosis and signal changes cord behind the C5 vertebral body. The predominance of his compression tapping at C4-5 and C5-6 at the disc space levels. [name] recommended a anterior cervical discectomy fusion at C4-5 and C5-6 I extensively went over the risks and benefits of the operation the patient as well as perioperative course expectations of outcome and alternatives surgery and he understands and agrees to proceed forward.  Past Medical History  Diagnosis Date  . Essential hypertension, benign   . Carotid artery disease   . Closed head injury 1996  . Polysubstance abuse     History of cocaine  . Cervical spondylosis   . History of colonic polyps   . History of seizures   . History of TIAs   . Type 2 diabetes mellitus     Pt stopped metformin 09/2013    Past Surgical History  Procedure Laterality Date  . Left carotid endarterectomy      6/06 - Dr. Scot Dock  . Tibia/fibula fracture repair    . Hand laceration repair    . Vascular surgery    . Skin graft to leg Left   . Fracture surgery      Family History  Problem Relation Age of Onset  . Cancer Father   . Coronary artery disease Mother    Social History:  reports that he has been smoking Cigarettes.  He has a 110 pack-year smoking history. He has never used smokeless tobacco. He reports that he drinks alcohol. He reports that he uses illicit drugs (Marijuana).  Allergies: No Known Allergies  Medications Prior to Admission  Medication Sig Dispense Refill  . amLODipine (NORVASC) 5 MG tablet Take 5 mg by mouth daily.      . cyclobenzaprine (FLEXERIL) 10 MG tablet Take 10 mg by  mouth 2 (two) times daily.      Marland Kitchen losartan-hydrochlorothiazide (HYZAAR) 50-12.5 MG per tablet Take 1 tablet by mouth daily.      . metFORMIN (GLUCOPHAGE) 500 MG tablet Take 500 mg by mouth 2 (two) times daily with a meal.        Results for orders placed during the hospital encounter of 12/05/13 (from the past 48 hour(s))  GLUCOSE, CAPILLARY     Status: Abnormal   Collection Time    12/05/13  8:11 AM      Result Value Ref Range   Glucose-Capillary 140 (*) 70 - 99 mg/dL   No results found.  Review of Systems  Constitutional: Negative.   Eyes: Negative.   Respiratory: Negative.   Cardiovascular: Negative.   Gastrointestinal: Negative.   Genitourinary: Negative.   Musculoskeletal: Positive for neck pain.  Skin: Negative.   Neurological: Positive for tingling, sensory change and headaches.  Psychiatric/Behavioral: Negative.     Blood pressure 136/74, pulse 83, temperature 97.8 F (36.6 C), temperature source Oral, resp. rate 18, height $RemoveBe'5\' 10"'SEAETWjVt$  (1.778 m), weight 72.576 kg (160 lb). Physical Exam  Constitutional: He is oriented to person, place, and time. He appears well-developed and well-nourished.  HENT:  Head: Normocephalic.  Eyes: Pupils are equal, round, and reactive to light.  Neck: Normal range of motion.  Cardiovascular:  Normal rate.   Respiratory: Effort normal.  GI: Soft. Bowel sounds are normal.  Neurological: He is alert and oriented to person, place, and time. He has normal strength. GCS eye subscore is 4. GCS verbal subscore is 5. GCS motor subscore is 6.  Strength is 5 out of 5 in his deltoids, biceps, triceps, wrist flexion, wrist extension, and intrinsics.  Skin: Skin is warm and dry.     Assessment/Plan 63 years and presents for an ACDF at C4-5 and C5-6  Alexio Sroka P 12/05/2013, 8:49 AM

## 2013-12-05 NOTE — Discharge Summary (Signed)
  Physician Discharge Summary  Patient ID: Ryan Butler MRN: [id number] DOB/AGE: 65-Mar-1952 65 y.o.  Admit date: 12/05/2013 Discharge date: 12/05/2013  Admission Diagnoses: Cervical spondylitic myelopathy from cervical stenosis C4-C5 6  Discharge Diagnoses: Same Active Problems:   Spinal stenosis in cervical region   Discharged Condition: good  Hospital Course: This is been hospital underwent the aforementioned procedure of an ACDF at C4-5 and C5-6 postoperatively patient did very well to the floor on the floor was angling and voiding spontaneously tolerating regular diet was stable for discharge home.  Consults: Significant Diagnostic Studies: Treatments: ACDF C4-5 C5-6 Discharge Exam: Blood pressure 164/87, pulse 118, temperature 97.6 F (36.4 C), temperature source Oral, resp. rate 18, height $RemoveBe'5\' 10"'WrwBLiffA$  (1.778 m), weight 72.576 kg (160 lb), SpO2 97.00%. Strength out of 5 wound clean and dry  Disposition: Home     Medication List         amLODipine 5 MG tablet  Commonly known as:  NORVASC  Take 5 mg by mouth daily.     cyclobenzaprine 10 MG tablet  Commonly known as:  FLEXERIL  Take 10 mg by mouth 2 (two) times daily.     cyclobenzaprine 10 MG tablet  Commonly known as:  FLEXERIL  Take 1 tablet (10 mg total) by mouth 2 (two) times daily.     losartan-hydrochlorothiazide 50-12.5 MG per tablet  Commonly known as:  HYZAAR  Take 1 tablet by mouth daily.     metFORMIN 500 MG tablet  Commonly known as:  GLUCOPHAGE  Take 500 mg by mouth 2 (two) times daily with a meal.     oxyCODONE-acetaminophen 5-325 MG per tablet  Commonly known as:  PERCOCET/ROXICET  Take 1-2 tablets by mouth every 4 (four) hours as needed for moderate pain.           Follow-up Information   Follow up with [facility] P, MD.   Specialty:  Neurosurgery   Contact information:   [address] [place] [address] [phone]       Signed: Ester Mabe P 12/05/2013, 5:42  PM

## 2013-12-05 NOTE — Op Note (Signed)
Preoperative diagnosis: Cervical spondylitic myelopathy from severe cervical stenosis and deformity at C4-5 and C5-6  Postoperative diagnosis: Same  Procedure: Anterior cervical discectomies and fusion at C4-5 and C5-6 using 7 mm lordotic globus peek cages packed with local autograft mixed with DBX and the globus extend plating system with 614 mm variable-angle screws  Surgeon: Dominica Severin Marcayla Budge  Assistant:Neelesh Nundkumar  Anesthesia: Gen.  EBL: Minimal  History of present illness: Patient is a 63 year old some is a progress worsening neck pain bilateral shoulder and arm pain weakness in his hands an MRI scan that showed severe stenosis and spondylosis C5 and C5-6 with signal changes cord behind the C5 vertebral body. Lateral plain films also showed DISH. The patient takes her treatment imaging findings and progression of clinical syndrome I recommended anterior cervical discectomies and fusion C4 on C5-6 I extensively reviewed the risks and benefits of the operation the patient as well as perioperative course expectations about alternatives of surgery and he understood and agreed to proceed forward.  Operative procedure: Patient brought into the or was induced a general anesthesia and positioned supine the neck in slight extension in 5 pounds of halter traction the right side his neck was prepped and draped in routine sterile fashion preoperative x-ray localize the appropriate level. So curvilinear incision was made just off midline to the anterior border of the sternomastoid. The superficial layer of the platysmas dissected and divided longitudinally the avascular pending sternomastoid and seemed strap muscles was developed and the provided fashion and the prevertebral fascia facet with Kitners. Interoperative X. identify the C4-5 disc space level. There was a large anterior ossified overlying the C5 vertebral body and this was teased off of the C5 vertebral body midway Leksell rongeur and Kerrison  punch. The spaces also had large anterior aspect overgrowth the disc spaces with partially calcified anterior annulus. This is all removed identifying the actual disc space and the native vertebral body anatomy. Both the space were drilled down capturing the bone shavings in a mucous trap then a much pronation first working at C5-6 disc space the disc space was further drilled down there was a large posterior posterior ossified several very difficult to get up underneath and around ultimately identify the posterior longitudinal ligament which was partially calcified tease this off the dura aggressively under been both endplates decompress the central canal both C6 pedicles were identified both C6 nerve roots, social pedicle then this is packed with Gelfoam testing C4-5 S1 fashion C4-5 was drilled down aggressive abutting under biting of both endplates allowed identification and decompression of the central canal dura both proximal C5 pedicles at C5 nerve roots were identified and decompressed. The spaces were then copiously irrigated meticulous in space was maintained endplates were scraped and implants were placed the plate was then fashioned 28 mm and lordosis all screws excellent purchase locking mechanisms were engaged there was a copiously irrigated meticulous hemostasis was maintained the platysmas reapproximated after Vicryl the skin was closed running 4 subcuticular benzo and Steri-Strips were applied patient recovered in stable condition. At the end of case all needle counts sponge counts were correct.

## 2013-12-05 NOTE — Progress Notes (Signed)
Pt and family given D/C instructions with Rx's, verbal understanding of teaching was provided. Pt's IV was removed prior to D/C. Pt D/C'd home via walking per MD order. Pt is stable @ D/C and has no other needs at this time. Holli Humbles, RN

## 2013-12-05 NOTE — Discharge Instructions (Signed)
No lifting no bending no twisting no driving as he is coming back and forth to see me. Keep the incision clean and dry.    Call MD for: Difficulty breathing, headache or visual disturbances, extreme fatigue  Call MD for: hives  Call MD for: persistant dizziness or light-headedness  Call MD for: persistant nausea and vomiting  Call MD for: redness, tenderness, or signs of infection (pain, swelling, redness, odor or green/yellow discharge around incision site)  Call MD for: severe uncontrolled pain  Call MD for: temperature >100.4 Diet - low sodium heart healthy Discharge instructions   Call [phone] for a followup appointment.   Increase activity slowly Remove dressing in 48 hours

## 2013-12-05 NOTE — Anesthesia Preprocedure Evaluation (Addendum)
Anesthesia Evaluation  Patient identified by MRN, date of birth, ID band Patient awake    Reviewed: Allergy & Precautions, H&P , NPO status , Patient's Chart, lab work & pertinent test results  Airway       Dental   Pulmonary Current Smoker,          Cardiovascular hypertension, + Peripheral Vascular Disease     Neuro/Psych  Headaches,    GI/Hepatic (+)     substance abuse  ,   Endo/Other  diabetes, Type 2, Oral Hypoglycemic Agents  Renal/GU      Musculoskeletal   Abdominal   Peds  Hematology   Anesthesia Other Findings CEA  Reproductive/Obstetrics                          Anesthesia Physical Anesthesia Plan  ASA: III  Anesthesia Plan: General   Post-op Pain Management:    Induction: Intravenous  Airway Management Planned: Oral ETT  Additional Equipment:   Intra-op Plan:   Post-operative Plan: Extubation in OR  Informed Consent: I have reviewed the patients History and Physical, chart, labs and discussed the procedure including the risks, benefits and alternatives for the proposed anesthesia with the patient or authorized representative who has indicated his/her understanding and acceptance.     Plan Discussed with:   Anesthesia Plan Comments:         Anesthesia Quick Evaluation

## 2013-12-05 NOTE — Anesthesia Postprocedure Evaluation (Signed)
  Anesthesia Post-op Note  Patient: Ryan Butler  Procedure(s) Performed: Procedure(s) with comments: ANTERIOR CERVICAL DECOMPRESSION/DISCECTOMY FUSION 2 LEVELS (N/A) - Anterior Cervical Discectomy and Fusion with PEEK Cages and allograft Cervical Four-Five/Five-Six  Patient Location: PACU  Anesthesia Type:General  Level of Consciousness: awake, alert , oriented and patient cooperative  Airway and Oxygen Therapy: Patient Spontanous Breathing  Post-op Pain: mild  Post-op Assessment: Post-op Vital signs reviewed, Patient's Cardiovascular Status Stable, Respiratory Function Stable, Patent Airway and No signs of Nausea or vomiting  Post-op Vital Signs: stable  Last Vitals:  Filed Vitals:   12/05/13 1330  BP: 148/79  Pulse: 88  Temp:   Resp: 35    Complications: No apparent anesthesia complications

## 2013-12-05 NOTE — Anesthesia Procedure Notes (Signed)
Procedure Name: Intubation Date/Time: 12/05/2013 9:26 AM Performed by: [place] Pink Pre-anesthesia Checklist: Patient identified, Emergency Drugs available, Suction available, Patient being monitored and Timeout performed Patient Re-evaluated:Patient Re-evaluated prior to inductionOxygen Delivery Method: Circle system utilized Preoxygenation: Pre-oxygenation with 100% oxygen Intubation Type: IV induction Ventilation: Mask ventilation without difficulty Laryngoscope Size: Mac and 4 Grade View: Grade II Tube type: Oral Tube size: 7.5 mm Number of attempts: 1 Airway Equipment and Method: Stylet and LTA kit utilized Placement Confirmation: ETT inserted through vocal cords under direct vision,  positive ETCO2 and breath sounds checked- equal and bilateral Secured at: 23 cm Tube secured with: Tape Dental Injury: Teeth and Oropharynx as per pre-operative assessment

## 2013-12-06 ENCOUNTER — Encounter (HOSPITAL_COMMUNITY): Payer: Self-pay | Admitting: Neurosurgery

## 2015-02-09 ENCOUNTER — Emergency Department (HOSPITAL_COMMUNITY): Payer: Medicare Other

## 2015-02-09 ENCOUNTER — Emergency Department (HOSPITAL_COMMUNITY)
Admission: EM | Admit: 2015-02-09 | Discharge: 2015-02-09 | Disposition: A | Discharge status: Home / Self Care | Payer: Medicare Other | Attending: Emergency Medicine | Admitting: Emergency Medicine

## 2015-02-09 ENCOUNTER — Encounter (HOSPITAL_COMMUNITY): Payer: Self-pay

## 2015-02-09 DIAGNOSIS — Z79899 Other long term (current) drug therapy: Secondary | ICD-10-CM | POA: Diagnosis not present

## 2015-02-09 DIAGNOSIS — G8929 Other chronic pain: Secondary | ICD-10-CM | POA: Diagnosis not present

## 2015-02-09 DIAGNOSIS — Z8673 Personal history of transient ischemic attack (TIA), and cerebral infarction without residual deficits: Secondary | ICD-10-CM | POA: Diagnosis not present

## 2015-02-09 DIAGNOSIS — M4806 Spinal stenosis, lumbar region: Secondary | ICD-10-CM | POA: Insufficient documentation

## 2015-02-09 DIAGNOSIS — Z86010 Personal history of colonic polyps: Secondary | ICD-10-CM | POA: Diagnosis not present

## 2015-02-09 DIAGNOSIS — Z720 Tobacco use: Secondary | ICD-10-CM | POA: Insufficient documentation

## 2015-02-09 DIAGNOSIS — I10 Essential (primary) hypertension: Secondary | ICD-10-CM | POA: Insufficient documentation

## 2015-02-09 DIAGNOSIS — M545 Low back pain, unspecified: Secondary | ICD-10-CM

## 2015-02-09 DIAGNOSIS — E119 Type 2 diabetes mellitus without complications: Secondary | ICD-10-CM | POA: Insufficient documentation

## 2015-02-09 DIAGNOSIS — Z87828 Personal history of other (healed) physical injury and trauma: Secondary | ICD-10-CM | POA: Insufficient documentation

## 2015-02-09 DIAGNOSIS — I2510 Atherosclerotic heart disease of native coronary artery without angina pectoris: Secondary | ICD-10-CM | POA: Insufficient documentation

## 2015-02-09 DIAGNOSIS — M48061 Spinal stenosis, lumbar region without neurogenic claudication: Secondary | ICD-10-CM

## 2015-02-09 DIAGNOSIS — M25552 Pain in left hip: Secondary | ICD-10-CM | POA: Diagnosis not present

## 2015-02-09 MED ORDER — DEXAMETHASONE SODIUM PHOSPHATE 10 MG/ML IJ SOLN
10.0000 mg | Freq: Once | INTRAMUSCULAR | Status: AC
  Administered 2015-02-09: 10 mg via INTRAVENOUS
  Filled 2015-02-09: qty 1

## 2015-02-09 MED ORDER — KETOROLAC TROMETHAMINE 30 MG/ML IJ SOLN
30.0000 mg | Freq: Once | INTRAMUSCULAR | Status: AC
  Administered 2015-02-09: 30 mg via INTRAVENOUS
  Filled 2015-02-09: qty 1

## 2015-02-09 MED ORDER — HYDROMORPHONE HCL 1 MG/ML IJ SOLN
1.0000 mg | INTRAMUSCULAR | Status: DC | PRN
  Administered 2015-02-09: 1 mg via INTRAVENOUS
  Filled 2015-02-09: qty 1

## 2015-02-09 MED ORDER — NAPROXEN 500 MG PO TABS: 500.0000 mg | ORAL_TABLET | Freq: Two times a day (BID) | ORAL | Status: DC

## 2015-02-09 MED ORDER — PREDNISONE 20 MG PO TABS: 20.0000 mg | ORAL_TABLET | Freq: Two times a day (BID) | ORAL | Status: DC

## 2015-02-09 MED ORDER — DIAZEPAM 5 MG PO TABS
5.0000 mg | ORAL_TABLET | Freq: Once | ORAL | Status: AC
Start: 2015-02-09 — End: 2015-02-09
  Administered 2015-02-09: 5 mg via ORAL
  Filled 2015-02-09: qty 1

## 2015-02-09 MED ORDER — MORPHINE SULFATE (PF) 10 MG/ML IV SOLN
10.0000 mg | Freq: Once | INTRAVENOUS | Status: AC
  Administered 2015-02-09: 10 mg via INTRAMUSCULAR
  Filled 2015-02-09: qty 1

## 2015-02-09 MED ORDER — OXYCODONE-ACETAMINOPHEN 5-325 MG PO TABS: 2.0000 | ORAL_TABLET | ORAL | Status: DC | PRN

## 2015-02-09 MED ORDER — ONDANSETRON 4 MG PO TBDP
4.0000 mg | ORAL_TABLET | Freq: Once | ORAL | Status: AC
  Administered 2015-02-09: 4 mg via ORAL
  Filled 2015-02-09: qty 1

## 2015-02-09 NOTE — Discharge Instructions (Signed)
Contact Dr. Saintclair Halsted your neurosurgeon for recheck.  Return to ER with any difficulty with weakness in your legs, progressive symptoms, or changes in your bowel or bladder.  Rest, supine, fluid and 48 hours or until your symptoms improved.

## 2015-02-09 NOTE — ED Notes (Signed)
Pt c/o pain in left hip x 2 days.  Denies injury.  Also reports nausea, no vomiting.

## 2015-02-09 NOTE — ED Notes (Signed)
MD at bedside. 

## 2015-02-09 NOTE — ED Provider Notes (Addendum)
CSN: [id number]     Arrival date & time 02/09/15  5176 History  This chart was scribed for Ryan Furry, MD by Starleen Arms, ED Scribe. This patient was seen in room APA18/APA18 and the patient's care was started at 9:10 AM.   Chief Complaint  Patient presents with  . Hip Pain   The history is provided by the patient. No language interpreter was used.   HPI Comments: Ryan Butler is a 63 y.o. male who presents to the Emergency Department complaining of moderate-severe, non-radiating left lower back pain onset 2 days ago without injury, worse with walking/movement/twisting, improved with laying on the left side.  He reports moving several heavy items, including a freezer prior to onset.  He denies hx of lower back pain.  The patient reports a prior hx of chronic, mild-moderate left hip pain, worse with walking.  He denies changes in extremity sensation, difficulty ambulating.   Past Medical History  Diagnosis Date  . Essential hypertension, benign   . Carotid artery disease   . Closed head injury 1996  . Polysubstance abuse     History of cocaine  . Cervical spondylosis   . History of colonic polyps   . History of seizures   . History of TIAs   . Type 2 diabetes mellitus     Pt stopped metformin 09/2013   Past Surgical History  Procedure Laterality Date  . Left carotid endarterectomy      6/06 - Dr. Scot Dock  . Tibia/fibula fracture repair    . Hand laceration repair    . Vascular surgery    . Skin graft to leg Left   . Fracture surgery    . Anterior cervical decomp/discectomy fusion N/A 12/05/2013    Procedure: ANTERIOR CERVICAL DECOMPRESSION/DISCECTOMY FUSION 2 LEVELS;  Surgeon: Elaina Hoops, MD;  Location: [facility];  Service: Neurosurgery;  Laterality: N/A;  Anterior Cervical Discectomy and Fusion with PEEK Cages and allograft Cervical Four-Five/Five-Six   Family History  Problem Relation Age of Onset  . Cancer Father   . Coronary artery disease Mother    Social History   Substance Use Topics  . Smoking status: Current Every Day Smoker -- 2.00 packs/day for 55 years    Types: Cigarettes  . Smokeless tobacco: Never Used     Comment: smokes 1/2 pack per day now  . Alcohol Use: Yes     Comment: drinks occasional    Review of Systems  Constitutional: Negative for fever, chills, diaphoresis, appetite change and fatigue.  HENT: Negative for mouth sores, sore throat and trouble swallowing.   Eyes: Negative for visual disturbance.  Respiratory: Negative for cough, chest tightness, shortness of breath and wheezing.   Cardiovascular: Negative for chest pain.  Gastrointestinal: Negative for nausea, vomiting, abdominal pain, diarrhea and abdominal distention.  Endocrine: Negative for polydipsia, polyphagia and polyuria.  Genitourinary: Negative for dysuria, frequency and hematuria.  Musculoskeletal: Positive for back pain and arthralgias. Negative for gait problem.  Skin: Negative for color change, pallor and rash.  Neurological: Positive for numbness (tingling, chronic). Negative for dizziness, syncope, light-headedness and headaches.  Hematological: Does not bruise/bleed easily.  Psychiatric/Behavioral: Negative for behavioral problems and confusion.      Allergies  Review of patient's allergies indicates no known allergies.  Home Medications   Prior to Admission medications   Medication Sig Start Date End Date Taking? Authorizing Provider  amitriptyline (ELAVIL) 150 MG tablet Take 150 mg by mouth at bedtime.   Yes Historical  Provider, MD  glipiZIDE (GLUCOTROL XL) 5 MG 24 hr tablet Take 5 mg by mouth daily with breakfast.   Yes Historical Provider, MD  losartan-hydrochlorothiazide (HYZAAR) 50-12.5 MG per tablet Take 1 tablet by mouth daily.   Yes Historical Provider, MD  cyclobenzaprine (FLEXERIL) 10 MG tablet Take 1 tablet (10 mg total) by mouth 2 (two) times daily. Patient not taking: Reported on 02/09/2015 12/05/13   Kary Kos, MD  naproxen (NAPROSYN)  500 MG tablet Take 1 tablet (500 mg total) by mouth 2 (two) times daily. 02/09/15   Ryan Furry, MD  oxyCODONE-acetaminophen (PERCOCET/ROXICET) 5-325 MG per tablet Take 2 tablets by mouth every 4 (four) hours as needed. 02/09/15   Ryan Furry, MD  predniSONE (DELTASONE) 20 MG tablet Take 1 tablet (20 mg total) by mouth 2 (two) times daily with a meal. 02/09/15   Ryan Furry, MD   BP 184/76 mmHg  Pulse 65  Temp(Src) 98.2 F (36.8 C) (Oral)  Resp 16  SpO2 100% Physical Exam  Constitutional: He is oriented to person, place, and time. He appears well-developed and well-nourished. No distress.  HENT:  Head: Normocephalic.  Eyes: Conjunctivae are normal. Pupils are equal, round, and reactive to light. No scleral icterus.  Neck: Normal range of motion. Neck supple. No thyromegaly present.  Cardiovascular: Normal rate and regular rhythm.  Exam reveals no gallop and no friction rub.   No murmur heard. Pulmonary/Chest: Effort normal and breath sounds normal. No respiratory distress. He has no wheezes. He has no rales.  Abdominal: Soft. Bowel sounds are normal. He exhibits no distension. There is no tenderness. There is no rebound.  Musculoskeletal: Normal range of motion.  Neurological: He is alert and oriented to person, place, and time.  Skin: Skin is warm and dry. No rash noted.  Psychiatric: He has a normal mood and affect. His behavior is normal.    ED Course  Procedures (including critical care time)  DIAGNOSTIC STUDIES: Oxygen Saturation is 100% on RA, normal by my interpretation.    COORDINATION OF CARE:  9:15 AM Will order imaging of lumbar spine and pain medication. Patient acknowledges and agrees with plan.    Labs Review Labs Reviewed - No data to display  Imaging Review Dg Lumbar Spine Complete  02/09/2015   CLINICAL DATA:  Chronic low back pain which has worsened recently with no recent injury.  EXAM: LUMBAR SPINE - COMPLETE 4+ VIEW  COMPARISON:  MRI scan of March 04, 2014.   FINDINGS: No fracture is noted. Moderate degenerative disc disease is noted at L1-2 with anterior osteophyte formation. Minimal grade 1 retrolisthesis is noted at this level. Mild degenerative disc disease is noted at L4-5 and L5-S1 with anterior osteophyte formation. Posterior facet joints appear unremarkable.  IMPRESSION: Multilevel degenerative disc disease. No acute abnormality seen in the lumbar spine.   Electronically Signed   By: Marijo Conception, M.D.   On: 02/09/2015 10:08   I have personally reviewed and evaluated these images and lab results as part of my medical decision-making.   EKG Interpretation None      MDM   Final diagnoses:  Left-sided low back pain without sciatica  Spinal stenosis of lumbar region     I personally performed the services described in this documentation, which was scribed in my presence. The recorded information has been reviewed and is accurate.  Chart review shows prior MRI of significant lumbar spinal stenosis. Patient states that he has been seen by Dr. Saintclair Halsted regarding this.  He underwent a series of epidurals to light and lidocaine injections last year. He does not remember if this helps. He does not have neurological loss. He is mandatory. He does not have abnormal reflexes or strength. He does not have radicular symptoms. He does not have cauda equina symptoms. After IV pain meds symptoms have improved. Although he still reports pain. Plan is home, rest supine, neurosurgical follow-up to discuss possibility of further interventions including surgery for spinal stenosis and chronic pain.   Ryan Furry, MD 02/09/15 1233  Ryan Furry, MD 02/09/15 [phone]

## 2015-03-15 ENCOUNTER — Other Ambulatory Visit (HOSPITAL_COMMUNITY): Payer: Self-pay | Admitting: Neurosurgery

## 2015-03-15 DIAGNOSIS — M5023 Other cervical disc displacement, cervicothoracic region: Secondary | ICD-10-CM

## 2015-03-15 DIAGNOSIS — M5136 Other intervertebral disc degeneration, lumbar region: Secondary | ICD-10-CM

## 2015-04-03 ENCOUNTER — Ambulatory Visit (HOSPITAL_COMMUNITY)
Admission: RE | Admit: 2015-04-03 | Discharge: 2015-04-03 | Disposition: A | Discharge status: Home / Self Care | Payer: Medicare Other | Source: Ambulatory Visit | Attending: Neurosurgery | Admitting: Neurosurgery

## 2015-04-03 DIAGNOSIS — Z981 Arthrodesis status: Secondary | ICD-10-CM | POA: Insufficient documentation

## 2015-04-03 DIAGNOSIS — M5136 Other intervertebral disc degeneration, lumbar region: Secondary | ICD-10-CM | POA: Diagnosis present

## 2015-04-03 DIAGNOSIS — J359 Chronic disease of tonsils and adenoids, unspecified: Secondary | ICD-10-CM | POA: Diagnosis not present

## 2015-04-03 DIAGNOSIS — M47896 Other spondylosis, lumbar region: Secondary | ICD-10-CM | POA: Diagnosis not present

## 2015-04-03 DIAGNOSIS — M47892 Other spondylosis, cervical region: Secondary | ICD-10-CM | POA: Insufficient documentation

## 2015-04-03 DIAGNOSIS — M5031 Other cervical disc degeneration,  high cervical region: Secondary | ICD-10-CM | POA: Insufficient documentation

## 2015-04-03 DIAGNOSIS — M5023 Other cervical disc displacement, cervicothoracic region: Secondary | ICD-10-CM

## 2015-04-03 DIAGNOSIS — M4806 Spinal stenosis, lumbar region: Secondary | ICD-10-CM | POA: Insufficient documentation

## 2015-04-05 ENCOUNTER — Other Ambulatory Visit (HOSPITAL_COMMUNITY): Payer: Self-pay | Admitting: Neurosurgery

## 2015-04-16 ENCOUNTER — Ambulatory Visit (HOSPITAL_COMMUNITY)
Admission: RE | Admit: 2015-04-16 | Discharge: 2015-04-16 | Disposition: A | Discharge status: Home / Self Care | Payer: Medicare Other | Source: Ambulatory Visit | Attending: Anesthesiology | Admitting: Anesthesiology

## 2015-04-16 ENCOUNTER — Encounter (HOSPITAL_COMMUNITY): Payer: Self-pay

## 2015-04-16 ENCOUNTER — Encounter (HOSPITAL_COMMUNITY)
Admission: RE | Admit: 2015-04-16 | Discharge: 2015-04-16 | Disposition: A | Discharge status: Home / Self Care | Payer: Medicare Other | Source: Ambulatory Visit | Attending: Neurosurgery | Admitting: Neurosurgery

## 2015-04-16 DIAGNOSIS — R0602 Shortness of breath: Secondary | ICD-10-CM | POA: Insufficient documentation

## 2015-04-16 DIAGNOSIS — Z01818 Encounter for other preprocedural examination: Secondary | ICD-10-CM | POA: Diagnosis present

## 2015-04-16 DIAGNOSIS — I10 Essential (primary) hypertension: Secondary | ICD-10-CM | POA: Diagnosis not present

## 2015-04-16 DIAGNOSIS — Z01812 Encounter for preprocedural laboratory examination: Secondary | ICD-10-CM | POA: Insufficient documentation

## 2015-04-16 DIAGNOSIS — Z01810 Encounter for preprocedural cardiovascular examination: Secondary | ICD-10-CM | POA: Diagnosis not present

## 2015-04-16 DIAGNOSIS — E119 Type 2 diabetes mellitus without complications: Secondary | ICD-10-CM | POA: Insufficient documentation

## 2015-04-16 DIAGNOSIS — F17200 Nicotine dependence, unspecified, uncomplicated: Secondary | ICD-10-CM | POA: Diagnosis not present

## 2015-04-16 HISTORY — DX: Personal history of other infectious and parasitic diseases: Z86.19

## 2015-04-16 LAB — TYPE AND SCREEN
ABO/RH(D): O NEG
ANTIBODY SCREEN: NEGATIVE

## 2015-04-16 LAB — COMPREHENSIVE METABOLIC PANEL
ALBUMIN: 4.7 g/dL (ref 3.5–5.0)
ALK PHOS: 96 U/L (ref 38–126)
ALT: 28 U/L (ref 17–63)
AST: 28 U/L (ref 15–41)
Anion gap: 10 (ref 5–15)
BILIRUBIN TOTAL: 1.1 mg/dL (ref 0.3–1.2)
BUN: 15 mg/dL (ref 6–20)
CALCIUM: 9.9 mg/dL (ref 8.9–10.3)
CO2: 24 mmol/L (ref 22–32)
Chloride: 102 mmol/L (ref 101–111)
Creatinine, Ser: 0.92 mg/dL (ref 0.61–1.24)
GFR calc Af Amer: >60 mL/min (ref >60)
GLUCOSE: 134 mg/dL — AB (ref 65–99)
POTASSIUM: 3.6 mmol/L (ref 3.5–5.1)
Sodium: 136 mmol/L (ref 135–145)
TOTAL PROTEIN: 7.6 g/dL (ref 6.5–8.1)

## 2015-04-16 LAB — CBC
HEMATOCRIT: 44.8 % (ref 39.0–52.0)
Hemoglobin: 15.7 g/dL (ref 13.0–17.0)
MCH: 31.2 pg (ref 26.0–34.0)
MCHC: 35 g/dL (ref 30.0–36.0)
MCV: 89.1 fL (ref 78.0–100.0)
PLATELETS: 166 10*3/uL (ref 150–400)
RBC: 5.03 MIL/uL (ref 4.22–5.81)
RDW: 13.2 % (ref 11.5–15.5)
WBC: 9.2 10*3/uL (ref 4.0–10.5)

## 2015-04-16 LAB — ABO/RH: ABO/RH(D): O NEG

## 2015-04-16 LAB — SURGICAL PCR SCREEN
MRSA, PCR: NEGATIVE
Staphylococcus aureus: POSITIVE — AB

## 2015-04-16 LAB — GLUCOSE, CAPILLARY: Glucose-Capillary: 147 mg/dL — ABNORMAL HIGH (ref 65–99)

## 2015-04-16 NOTE — Progress Notes (Signed)
Patient reports that he is having shortness of breath without chest pain that started within the last several days. Patient reports this as intermittent and attributes it to being a smoker. Patient reports he likes to "fix" things himself, does not like going to doctors unless he has to, and has stopped taking his prescribed BP medication (losartan) and diabetic meds ~ 2- 3 months ago. Currently he reports his PCP is Dr. Jarold Song, [facility] in [place] [phone]. BP recheck was 210/103 in right arm and 188/97 in left arm. Had stress test in 2013 noted in [place]. Denies otherwise seeing a cardiologist.

## 2015-04-16 NOTE — Pre-Procedure Instructions (Signed)
Ryan Butler  04/16/2015     Your procedure is scheduled on November 30  Report to [facility] Admitting at 11:15 A.M.  Call this number if you have problems the morning of surgery:  [phone]   Remember:  Do not eat food or drink liquids after midnight.  Take these medicines the morning of surgery with A SIP OF WATER None;   STOP Aspirin November 23   STOP/ Do not take Aspirin, Aleve, Naproxen, Advil, Ibuprofen, Motrin, Vitamins, Herbs, or Supplements starting November 23   Do not wear jewelry, make-up or nail polish.  Do not wear lotions, powders, or perfumes.  You may wear deodorant.  Do not shave 48 hours prior to surgery.  Men may shave face and neck.  Do not bring valuables to the hospital.  [facility] is not responsible for any belongings or valuables.  Contacts, dentures or bridgework may not be worn into surgery.  Leave your suitcase in the car.  After surgery it may be brought to your room.  For patients admitted to the hospital, discharge time will be determined by your treatment team.  Patients discharged the day of surgery will not be allowed to drive home.   [facility] - Preparing for Surgery  Before surgery, you can play an important role.  Because skin is not sterile, your skin needs to be as free of germs as possible.  You can reduce the number of germs on you skin by washing with CHG (chlorahexidine gluconate) soap before surgery.  CHG is an antiseptic cleaner which kills germs and bonds with the skin to continue killing germs even after washing.  Please DO NOT use if you have an allergy to CHG or antibacterial soaps.  If your skin becomes reddened/irritated stop using the CHG and inform your nurse when you arrive at Short Stay.  Do not shave (including legs and underarms) for at least 48 hours prior to the first CHG shower.  You may shave your face.  Please follow these instructions carefully:   1.  Shower with CHG Soap the night before  surgery and the morning of Surgery.  2.  If you choose to wash your hair, wash your hair first as usual with your normal shampoo.  3.  After you shampoo, rinse your hair and body thoroughly to remove the shampoo.  4.  Use CHG as you would any other liquid soap.  You can apply CHG directly to the skin and wash gently with scrungie or a clean washcloth.  5.  Apply the CHG Soap to your body ONLY FROM THE NECK DOWN.  Do not use on open wounds or open sores.  Avoid contact with your eyes, ears, mouth and genitals (private parts).  Wash genitals (private parts) with your normal soap.  6.  Wash thoroughly, paying special attention to the area where your surgery will be performed.  7.  Thoroughly rinse your body with warm water from the neck down.  8.  DO NOT shower/wash with your normal soap after using and rinsing off the CHG Soap.  9.  Pat yourself dry with a clean towel.            10.  Wear clean pajamas.            11.  Place clean sheets on your bed the night of your first shower and do not sleep with pets.  Day of Surgery  Do not apply any lotions the morning  of surgery.  Please wear clean clothes to the hospital/surgery center.  Please read over the following fact sheets that you were given. Pain Booklet, Coughing and Deep Breathing, Blood Transfusion Information and Surgical Site Infection Prevention

## 2015-04-16 NOTE — Progress Notes (Addendum)
Anesthesia consult:  Pt is 64 year old male scheduled for L4-5, L5-S1 posterior lumbar fusion on 04/25/2015 with Dr. Saintclair Halsted.   PMH includes:  HTN, carotid artery disease (s/p L CEA 2006), DM, polysubstance abuse history. Current smoker. BMI 22.5. S/p ACDF 12/05/13.   Medications include: ASA. Note pt took self off DM and HTN meds around 3 months ago.   BP at PAT was 186/90. On recheck was 210/103 and 188/97. Pt encouraged to restart BP meds. He understands that if his BP is as high as it was at PAT on DOS, his surgery is likely to be cancelled. Encouraged pt to f/u with PCP on HTN and DM management.   CV RRR, no MRG. Lungs CTA B. No pedal edema, no carotid bruits.   Preoperative labs reviewed. Glucose 134, hgbA1c 7.6. At this point, DM appears to be somewhat diet controlled. Pt is aware if he comes in DOS and glucose is >200, his surgery is likely to be cancelled.   EKG 04/16/15: Sinus rhythm with PACs.   Nuclear stress test on 10/06/11 [facility]) showed: Probably normal LV perfusion. No chest pain or ECG changes consistent with ischemia. Global LV systolic function is normal, EF 58%. Normal LV wall motion. Small, partially reversible apical- defect assocated with normal wall motion consistent with attenuation artifact. Doubt small area represents ischemia. Report was reviewed by cardiologist Dr. Rozann Lesches who felt "Overall relatively low risk study, arguing against any major degree of obstructive CAD. As indicated in my office visit, would recommend aggressive risk factor modification strategies and followup with Dr. Legrand Rams. We can see him back as needed."  Echo on 10/01/11 showed: - Left ventricle: The cavity size was normal. Wall thickness was increased in a pattern of mild LVH. There was mild concentric hypertrophy. Systolic function was normal. The estimated ejection fraction was in the range of 55% to 60%. Wall motion was normal; there were no regional wall motion  abnormalities. Doppler parameters are consistent with abnormal left ventricular relaxation (grade 1 diastolic dysfunction). - Aortic valve: Valve area: 2.5cm^2(VTI). Valve area: 2.69cm^2 (Vmax). - Mitral valve: Calcification, with moderate involvement of anterior leaflet chords. - Atrial septum: No defect or patent foramen ovale was identified. Impressions: Normal pulmonary artery pressure.  Pt called and spoke with Myra Gianotti, PA, regarding the medicines he has at home that he is not taking. Pt has amlodipine $RemoveBefore'5mg'[id number]$  at home. Pt instructed to restart this medicine.   Left voicemail for Lorriane Shire in Dr. Windy Carina office about pt's HTN and DM and noncompliance with meds.   If pt's BP and glucose acceptable DOS, I anticipate he can proceed as scheduled.   Willeen Cass, FNP-BC [facility]/Anesthesiology Phone: [phone] 04/17/2015 2:32 PM

## 2015-04-17 LAB — HEMOGLOBIN A1C
HEMOGLOBIN A1C: 7.6 % — AB (ref 4.8–5.6)
Mean Plasma Glucose: 171 mg/dL

## 2015-04-24 MED ORDER — DEXAMETHASONE SODIUM PHOSPHATE 10 MG/ML IJ SOLN
10.0000 mg | INTRAMUSCULAR | Status: DC
  Filled 2015-04-24: qty 1

## 2015-04-24 MED ORDER — CEFAZOLIN SODIUM-DEXTROSE 2-3 GM-% IV SOLR
2.0000 g | INTRAVENOUS | Status: AC
  Administered 2015-04-25: 2 g via INTRAVENOUS
  Filled 2015-04-24: qty 50

## 2015-04-25 ENCOUNTER — Encounter (HOSPITAL_COMMUNITY): Payer: Self-pay | Admitting: *Deleted

## 2015-04-25 ENCOUNTER — Inpatient Hospital Stay (HOSPITAL_COMMUNITY)
Admission: RE | Admit: 2015-04-25 | Discharge: 2015-04-27 | DRG: 460 | Disposition: A | Discharge status: Home / Self Care | Payer: Medicare Other | Source: Ambulatory Visit | Attending: Neurosurgery | Admitting: Neurosurgery

## 2015-04-25 ENCOUNTER — Encounter (HOSPITAL_COMMUNITY)
Admission: RE | Disposition: A | Discharge status: Home / Self Care | Payer: Medicare Other | Source: Ambulatory Visit | Attending: Neurosurgery

## 2015-04-25 ENCOUNTER — Inpatient Hospital Stay (HOSPITAL_COMMUNITY): Payer: Medicare Other | Admitting: Certified Registered Nurse Anesthetist

## 2015-04-25 ENCOUNTER — Inpatient Hospital Stay (HOSPITAL_COMMUNITY): Payer: Medicare Other | Admitting: Emergency Medicine

## 2015-04-25 ENCOUNTER — Inpatient Hospital Stay (HOSPITAL_COMMUNITY): Payer: Medicare Other

## 2015-04-25 DIAGNOSIS — F17210 Nicotine dependence, cigarettes, uncomplicated: Secondary | ICD-10-CM | POA: Diagnosis present

## 2015-04-25 DIAGNOSIS — E1151 Type 2 diabetes mellitus with diabetic peripheral angiopathy without gangrene: Secondary | ICD-10-CM | POA: Diagnosis present

## 2015-04-25 DIAGNOSIS — M5416 Radiculopathy, lumbar region: Secondary | ICD-10-CM | POA: Diagnosis present

## 2015-04-25 DIAGNOSIS — M48061 Spinal stenosis, lumbar region without neurogenic claudication: Secondary | ICD-10-CM | POA: Diagnosis present

## 2015-04-25 DIAGNOSIS — Z7982 Long term (current) use of aspirin: Secondary | ICD-10-CM

## 2015-04-25 DIAGNOSIS — M4806 Spinal stenosis, lumbar region: Principal | ICD-10-CM | POA: Diagnosis present

## 2015-04-25 DIAGNOSIS — Z8673 Personal history of transient ischemic attack (TIA), and cerebral infarction without residual deficits: Secondary | ICD-10-CM

## 2015-04-25 DIAGNOSIS — I10 Essential (primary) hypertension: Secondary | ICD-10-CM | POA: Diagnosis present

## 2015-04-25 DIAGNOSIS — M532X6 Spinal instabilities, lumbar region: Secondary | ICD-10-CM | POA: Diagnosis present

## 2015-04-25 DIAGNOSIS — Z981 Arthrodesis status: Secondary | ICD-10-CM

## 2015-04-25 DIAGNOSIS — M79605 Pain in left leg: Secondary | ICD-10-CM | POA: Diagnosis present

## 2015-04-25 DIAGNOSIS — Z885 Allergy status to narcotic agent status: Secondary | ICD-10-CM

## 2015-04-25 DIAGNOSIS — Z419 Encounter for procedure for purposes other than remedying health state, unspecified: Secondary | ICD-10-CM

## 2015-04-25 LAB — GLUCOSE, CAPILLARY
Glucose-Capillary: 137 mg/dL — ABNORMAL HIGH (ref 65–99)
Glucose-Capillary: 139 mg/dL — ABNORMAL HIGH (ref 65–99)
Glucose-Capillary: 149 mg/dL — ABNORMAL HIGH (ref 65–99)

## 2015-04-25 SURGERY — POSTERIOR LUMBAR FUSION 2 LEVEL
Anesthesia: General | Site: Back

## 2015-04-25 MED ORDER — ROCURONIUM BROMIDE 50 MG/5ML IV SOLN
INTRAVENOUS | Status: AC
  Filled 2015-04-25: qty 1

## 2015-04-25 MED ORDER — SODIUM CHLORIDE 0.9 % IV SOLN: 250.0000 mL | INTRAVENOUS | Status: DC

## 2015-04-25 MED ORDER — LACTATED RINGERS IV SOLN
INTRAVENOUS | Status: DC
  Administered 2015-04-25 (×3): via INTRAVENOUS

## 2015-04-25 MED ORDER — SODIUM CHLORIDE 0.9 % IJ SOLN: 3.0000 mL | INTRAMUSCULAR | Status: DC | PRN

## 2015-04-25 MED ORDER — BUPIVACAINE LIPOSOME 1.3 % IJ SUSP
20.0000 mL | INTRAMUSCULAR | Status: AC
  Filled 2015-04-25: qty 20

## 2015-04-25 MED ORDER — LIDOCAINE HCL (CARDIAC) 20 MG/ML IV SOLN
INTRAVENOUS | Status: AC
  Filled 2015-04-25: qty 5

## 2015-04-25 MED ORDER — PHENYLEPHRINE HCL 10 MG/ML IJ SOLN
10.0000 mg | INTRAVENOUS | Status: DC | PRN
  Administered 2015-04-25: 25 ug/min via INTRAVENOUS

## 2015-04-25 MED ORDER — PROPOFOL 10 MG/ML IV BOLUS
INTRAVENOUS | Status: DC | PRN
  Administered 2015-04-25: 150 mg via INTRAVENOUS

## 2015-04-25 MED ORDER — DOCUSATE SODIUM 100 MG PO CAPS
100.0000 mg | ORAL_CAPSULE | Freq: Two times a day (BID) | ORAL | Status: DC
  Administered 2015-04-25 – 2015-04-27 (×4): 100 mg via ORAL
  Filled 2015-04-25 (×4): qty 1

## 2015-04-25 MED ORDER — EPHEDRINE SULFATE 50 MG/ML IJ SOLN
INTRAMUSCULAR | Status: DC | PRN
  Administered 2015-04-25: 10 mg via INTRAVENOUS

## 2015-04-25 MED ORDER — ASPIRIN EC 81 MG PO TBEC
81.0000 mg | DELAYED_RELEASE_TABLET | Freq: Every day | ORAL | Status: DC
  Administered 2015-04-25 – 2015-04-27 (×3): 81 mg via ORAL
  Filled 2015-04-25 (×3): qty 1

## 2015-04-25 MED ORDER — ONDANSETRON HCL 4 MG/2ML IJ SOLN
INTRAMUSCULAR | Status: AC
  Filled 2015-04-25: qty 2

## 2015-04-25 MED ORDER — SODIUM CHLORIDE 0.9 % IJ SOLN
3.0000 mL | Freq: Two times a day (BID) | INTRAMUSCULAR | Status: DC
  Administered 2015-04-25: 3 mL via INTRAVENOUS

## 2015-04-25 MED ORDER — MIDAZOLAM HCL 5 MG/5ML IJ SOLN
INTRAMUSCULAR | Status: DC | PRN
  Administered 2015-04-25: 2 mg via INTRAVENOUS

## 2015-04-25 MED ORDER — FENTANYL CITRATE (PF) 250 MCG/5ML IJ SOLN
INTRAMUSCULAR | Status: AC
  Filled 2015-04-25: qty 5

## 2015-04-25 MED ORDER — CEFAZOLIN SODIUM-DEXTROSE 2-3 GM-% IV SOLR
2.0000 g | Freq: Three times a day (TID) | INTRAVENOUS | Status: AC
  Administered 2015-04-25 – 2015-04-27 (×6): 2 g via INTRAVENOUS
  Filled 2015-04-25 (×6): qty 50

## 2015-04-25 MED ORDER — CYCLOBENZAPRINE HCL 10 MG PO TABS
10.0000 mg | ORAL_TABLET | Freq: Three times a day (TID) | ORAL | Status: DC | PRN
Start: 2015-04-25 — End: 2015-04-27
  Administered 2015-04-25 – 2015-04-27 (×5): 10 mg via ORAL
  Filled 2015-04-25 (×5): qty 1

## 2015-04-25 MED ORDER — HYDROMORPHONE HCL 1 MG/ML IJ SOLN
0.2500 mg | INTRAMUSCULAR | Status: DC | PRN
  Administered 2015-04-25 (×4): 0.5 mg via INTRAVENOUS

## 2015-04-25 MED ORDER — ONDANSETRON HCL 4 MG/2ML IJ SOLN: 4.0000 mg | Freq: Once | INTRAMUSCULAR | Status: DC | PRN

## 2015-04-25 MED ORDER — PROPOFOL 10 MG/ML IV BOLUS
INTRAVENOUS | Status: AC
  Filled 2015-04-25: qty 20

## 2015-04-25 MED ORDER — ONDANSETRON HCL 4 MG/2ML IJ SOLN
INTRAMUSCULAR | Status: DC | PRN
  Administered 2015-04-25: 4 mg via INTRAVENOUS

## 2015-04-25 MED ORDER — MIDAZOLAM HCL 2 MG/2ML IJ SOLN
0.5000 mg | Freq: Once | INTRAMUSCULAR | Status: AC
  Administered 2015-04-25: 2 mg via INTRAVENOUS

## 2015-04-25 MED ORDER — ACETAMINOPHEN 650 MG RE SUPP: 650.0000 mg | RECTAL | Status: DC | PRN

## 2015-04-25 MED ORDER — OXYCODONE-ACETAMINOPHEN 5-325 MG PO TABS
ORAL_TABLET | ORAL | Status: AC
  Administered 2015-04-25: 2 via ORAL
  Filled 2015-04-25: qty 2

## 2015-04-25 MED ORDER — 0.9 % SODIUM CHLORIDE (POUR BTL) OPTIME
TOPICAL | Status: DC | PRN
  Administered 2015-04-25: 1000 mL

## 2015-04-25 MED ORDER — MIDAZOLAM HCL 2 MG/2ML IJ SOLN
INTRAMUSCULAR | Status: AC
  Filled 2015-04-25: qty 2

## 2015-04-25 MED ORDER — LIDOCAINE HCL (CARDIAC) 20 MG/ML IV SOLN
INTRAVENOUS | Status: DC | PRN
  Administered 2015-04-25: 100 mg via INTRAVENOUS

## 2015-04-25 MED ORDER — MEPERIDINE HCL 25 MG/ML IJ SOLN: 6.2500 mg | INTRAMUSCULAR | Status: DC | PRN

## 2015-04-25 MED ORDER — HYDROMORPHONE HCL 1 MG/ML IJ SOLN
INTRAMUSCULAR | Status: AC
  Administered 2015-04-25: 0.5 mg via INTRAVENOUS
  Filled 2015-04-25: qty 1

## 2015-04-25 MED ORDER — NEOSTIGMINE METHYLSULFATE 10 MG/10ML IV SOLN
INTRAVENOUS | Status: DC | PRN
  Administered 2015-04-25: 4 mg via INTRAVENOUS

## 2015-04-25 MED ORDER — BUPIVACAINE LIPOSOME 1.3 % IJ SUSP
INTRAMUSCULAR | Status: DC | PRN
  Administered 2015-04-25: 20 mL

## 2015-04-25 MED ORDER — LABETALOL HCL 5 MG/ML IV SOLN
INTRAVENOUS | Status: DC | PRN
  Administered 2015-04-25: 5 mg via INTRAVENOUS

## 2015-04-25 MED ORDER — OXYCODONE-ACETAMINOPHEN 5-325 MG PO TABS
1.0000 | ORAL_TABLET | ORAL | Status: DC | PRN
  Administered 2015-04-25 – 2015-04-27 (×11): 2 via ORAL
  Filled 2015-04-25 (×10): qty 2

## 2015-04-25 MED ORDER — PHENYLEPHRINE HCL 10 MG/ML IJ SOLN
INTRAMUSCULAR | Status: DC | PRN
  Administered 2015-04-25: 80 ug via INTRAVENOUS

## 2015-04-25 MED ORDER — ONDANSETRON HCL 4 MG/2ML IJ SOLN
4.0000 mg | INTRAMUSCULAR | Status: DC | PRN
  Administered 2015-04-26: 4 mg via INTRAVENOUS
  Filled 2015-04-25 (×2): qty 2

## 2015-04-25 MED ORDER — PHENOL 1.4 % MT LIQD: 1.0000 | OROMUCOSAL | Status: DC | PRN

## 2015-04-25 MED ORDER — AMITRIPTYLINE HCL 75 MG PO TABS: 150.0000 mg | ORAL_TABLET | Freq: Every evening | ORAL | Status: DC | PRN

## 2015-04-25 MED ORDER — GLYCOPYRROLATE 0.2 MG/ML IJ SOLN
INTRAMUSCULAR | Status: DC | PRN
  Administered 2015-04-25: .6 mg via INTRAVENOUS

## 2015-04-25 MED ORDER — ROCURONIUM BROMIDE 100 MG/10ML IV SOLN
INTRAVENOUS | Status: DC | PRN
  Administered 2015-04-25: 50 mg via INTRAVENOUS

## 2015-04-25 MED ORDER — SODIUM CHLORIDE 0.9 % IR SOLN
Status: DC | PRN
  Administered 2015-04-25: 500 mL

## 2015-04-25 MED ORDER — MIDAZOLAM HCL 2 MG/2ML IJ SOLN
INTRAMUSCULAR | Status: AC
  Administered 2015-04-25: 2 mg via INTRAVENOUS
  Filled 2015-04-25: qty 2

## 2015-04-25 MED ORDER — HYDROMORPHONE HCL 1 MG/ML IJ SOLN
0.5000 mg | INTRAMUSCULAR | Status: DC | PRN
  Administered 2015-04-25 – 2015-04-27 (×9): 1 mg via INTRAVENOUS
  Filled 2015-04-25 (×10): qty 1

## 2015-04-25 MED ORDER — VANCOMYCIN HCL 1000 MG IV SOLR
INTRAVENOUS | Status: AC
  Filled 2015-04-25: qty 1000

## 2015-04-25 MED ORDER — FENTANYL CITRATE (PF) 100 MCG/2ML IJ SOLN
INTRAMUSCULAR | Status: DC | PRN
  Administered 2015-04-25: 100 ug via INTRAVENOUS
  Administered 2015-04-25 (×8): 50 ug via INTRAVENOUS

## 2015-04-25 MED ORDER — VECURONIUM BROMIDE 10 MG IV SOLR
INTRAVENOUS | Status: DC | PRN
  Administered 2015-04-25: 2 mg via INTRAVENOUS

## 2015-04-25 MED ORDER — ACETAMINOPHEN 325 MG PO TABS: 650.0000 mg | ORAL_TABLET | ORAL | Status: DC | PRN

## 2015-04-25 MED ORDER — SURGIFOAM 100 EX MISC
CUTANEOUS | Status: DC | PRN
  Administered 2015-04-25 (×2): 20 mL via TOPICAL

## 2015-04-25 MED ORDER — VANCOMYCIN HCL 1000 MG IV SOLR
INTRAVENOUS | Status: DC | PRN
Start: 2015-04-25 — End: 2015-04-25
  Administered 2015-04-25: 1000 mg via TOPICAL

## 2015-04-25 MED ORDER — MENTHOL 3 MG MT LOZG: 1.0000 | LOZENGE | OROMUCOSAL | Status: DC | PRN

## 2015-04-25 MED ORDER — LIDOCAINE-EPINEPHRINE 1 %-1:100000 IJ SOLN
INTRAMUSCULAR | Status: DC | PRN
  Administered 2015-04-25: 6 mL

## 2015-04-25 MED ORDER — LOSARTAN POTASSIUM 50 MG PO TABS
25.0000 mg | ORAL_TABLET | Freq: Every day | ORAL | Status: DC
  Administered 2015-04-26 – 2015-04-27 (×2): 25 mg via ORAL
  Filled 2015-04-25 (×2): qty 1

## 2015-04-25 SURGICAL SUPPLY — 76 items
APL SKNCLS STERI-STRIP NONHPOA (GAUZE/BANDAGES/DRESSINGS) ×1
BAG DECANTER FOR FLEXI CONT (MISCELLANEOUS) ×2 IMPLANT
BENZOIN TINCTURE PRP APPL 2/3 (GAUZE/BANDAGES/DRESSINGS) ×2 IMPLANT
BIT DRILL 5.0/4.0 (BIT) IMPLANT
BLADE CLIPPER SURG (BLADE) IMPLANT
BLADE SURG 11 STRL SS (BLADE) ×2 IMPLANT
BONE ALLOSTEM MORSELIZED 5CC (Bone Implant) ×1 IMPLANT
BRUSH SCRUB EZ PLAIN DRY (MISCELLANEOUS) ×2 IMPLANT
BUR MATCHSTICK NEURO 3.0 LAGG (BURR) ×2 IMPLANT
BUR PRECISION FLUTE 6.0 (BURR) ×2 IMPLANT
CAGE RISE 11-17-15 10X26 (Cage) ×2 IMPLANT
CANISTER SUCT 3000ML PPV (MISCELLANEOUS) ×2 IMPLANT
CAP LOCKING (Cap) ×6 IMPLANT
CAP LOCKING 5.5 CREO (Cap) IMPLANT
CONT SPEC 4OZ CLIKSEAL STRL BL (MISCELLANEOUS) ×2 IMPLANT
COVER BACK TABLE 60X90IN (DRAPES) ×2 IMPLANT
DECANTER SPIKE VIAL GLASS SM (MISCELLANEOUS) ×2 IMPLANT
DRAPE C-ARM 42X72 X-RAY (DRAPES) ×2 IMPLANT
DRAPE C-ARMOR (DRAPES) ×2 IMPLANT
DRAPE LAPAROTOMY 100X72X124 (DRAPES) ×2 IMPLANT
DRAPE POUCH INSTRU U-SHP 10X18 (DRAPES) ×2 IMPLANT
DRAPE PROXIMA HALF (DRAPES) IMPLANT
DRAPE SURG 17X23 STRL (DRAPES) ×2 IMPLANT
DRILL 5.0/4.0 (BIT) ×2
DRSG OPSITE POSTOP 4X10 (GAUZE/BANDAGES/DRESSINGS) ×1 IMPLANT
DRSG OPSITE POSTOP 4X6 (GAUZE/BANDAGES/DRESSINGS) ×1 IMPLANT
DURAPREP 26ML APPLICATOR (WOUND CARE) ×2 IMPLANT
ELECT REM PT RETURN 9FT ADLT (ELECTROSURGICAL) ×2
ELECTRODE REM PT RTRN 9FT ADLT (ELECTROSURGICAL) ×1 IMPLANT
EVACUATOR 3/16  PVC DRAIN (DRAIN) ×1
EVACUATOR 3/16 PVC DRAIN (DRAIN) ×1 IMPLANT
GAUZE SPONGE 4X4 12PLY STRL (GAUZE/BANDAGES/DRESSINGS) ×2 IMPLANT
GAUZE SPONGE 4X4 16PLY XRAY LF (GAUZE/BANDAGES/DRESSINGS) IMPLANT
GLOVE BIO SURGEON STRL SZ8 (GLOVE) ×4 IMPLANT
GLOVE ECLIPSE 6.5 STRL STRAW (GLOVE) ×1 IMPLANT
GLOVE EXAM NITRILE LRG STRL (GLOVE) IMPLANT
GLOVE EXAM NITRILE MD LF STRL (GLOVE) IMPLANT
GLOVE EXAM NITRILE XL STR (GLOVE) IMPLANT
GLOVE EXAM NITRILE XS STR PU (GLOVE) IMPLANT
GLOVE INDICATOR 8.5 STRL (GLOVE) ×4 IMPLANT
GOWN STRL REUS W/ TWL LRG LVL3 (GOWN DISPOSABLE) IMPLANT
GOWN STRL REUS W/ TWL XL LVL3 (GOWN DISPOSABLE) ×2 IMPLANT
GOWN STRL REUS W/TWL 2XL LVL3 (GOWN DISPOSABLE) IMPLANT
GOWN STRL REUS W/TWL LRG LVL3 (GOWN DISPOSABLE)
GOWN STRL REUS W/TWL XL LVL3 (GOWN DISPOSABLE) ×4
IMPL SPINE RISE 8-15-10-30M (Neuro Prosthesis/Implant) IMPLANT
IMPLANT SPINE RISE 8-15-10-30M (Neuro Prosthesis/Implant) ×4 IMPLANT
KIT BASIN OR (CUSTOM PROCEDURE TRAY) ×2 IMPLANT
KIT ROOM TURNOVER OR (KITS) ×2 IMPLANT
LIQUID BAND (GAUZE/BANDAGES/DRESSINGS) ×2 IMPLANT
MILL MEDIUM DISP (BLADE) ×1 IMPLANT
NDL HYPO 21X1.5 SAFETY (NEEDLE) ×1 IMPLANT
NDL HYPO 25X1 1.5 SAFETY (NEEDLE) ×1 IMPLANT
NEEDLE HYPO 21X1.5 SAFETY (NEEDLE) ×2 IMPLANT
NEEDLE HYPO 25X1 1.5 SAFETY (NEEDLE) ×2 IMPLANT
NS IRRIG 1000ML POUR BTL (IV SOLUTION) ×2 IMPLANT
PACK LAMINECTOMY NEURO (CUSTOM PROCEDURE TRAY) ×2 IMPLANT
PAD ARMBOARD 7.5X6 YLW CONV (MISCELLANEOUS) ×6 IMPLANT
ROD CREO 60MM (Rod) ×2 IMPLANT
SCREW CORT CREO 6.5-5.5X35 (Screw) ×2 IMPLANT
SCREW CREO SHAFT 6.5/5.0X40MM (Screw) ×1 IMPLANT
SCREW MOD 6.0-5.0X35MM (Screw) ×1 IMPLANT
SCREW PREASSEMBLY 6.0-5.0X35 (Screw) ×2 IMPLANT
SPONGE LAP 4X18 X RAY DECT (DISPOSABLE) IMPLANT
SPONGE SURGIFOAM ABS GEL 100 (HEMOSTASIS) ×3 IMPLANT
STRIP CLOSURE SKIN 1/2X4 (GAUZE/BANDAGES/DRESSINGS) ×3 IMPLANT
SUT VIC AB 0 CT1 18XCR BRD8 (SUTURE) ×1 IMPLANT
SUT VIC AB 0 CT1 8-18 (SUTURE) ×2
SUT VIC AB 2-0 CT1 18 (SUTURE) ×2 IMPLANT
SUT VIC AB 4-0 PS2 27 (SUTURE) ×2 IMPLANT
SYR 20CC LL (SYRINGE) ×2 IMPLANT
TOWEL OR 17X24 6PK STRL BLUE (TOWEL DISPOSABLE) ×2 IMPLANT
TOWEL OR 17X26 10 PK STRL BLUE (TOWEL DISPOSABLE) ×2 IMPLANT
TRAY FOLEY W/METER SILVER 14FR (SET/KITS/TRAYS/PACK) ×2 IMPLANT
TULIP CREP AMP 5.5MM (Orthopedic Implant) ×2 IMPLANT
WATER STERILE IRR 1000ML POUR (IV SOLUTION) ×2 IMPLANT

## 2015-04-25 NOTE — Transfer of Care (Signed)
Immediate Anesthesia Transfer of Care Note  Patient: SALLY REIMERS  Procedure(s) Performed: Procedure(s) with comments: POSTERIOR LUMBAR Interbody FUSION with Pedicle Screw Fixation Lumbar Four-Five/ Lumbar Five-Sacral One (N/A) - POSTERIOR LUMBAR FUSION Lumbar Four-Five/ Lumbar Five-Sacral One  Patient Location: PACU  Anesthesia Type:General  Level of Consciousness: awake, alert  and oriented  Airway & Oxygen Therapy: Patient Spontanous Breathing and Patient connected to nasal cannula oxygen  Post-op Assessment: Report given to RN and Post -op Vital signs reviewed and stable  Post vital signs: Reviewed and stable  Last Vitals:  Filed Vitals:   04/25/15 1129 04/25/15 1735  BP: 142/81   Pulse: 68   Temp: 36.4 C 37 C  Resp: 18     Complications: No apparent anesthesia complications

## 2015-04-25 NOTE — Anesthesia Procedure Notes (Signed)
Procedure Name: Intubation Date/Time: 04/25/2015 1:35 PM Performed by: Clearnce Sorrel Pre-anesthesia Checklist: Patient identified, Timeout performed, Emergency Drugs available, Suction available and Patient being monitored Patient Re-evaluated:Patient Re-evaluated prior to inductionOxygen Delivery Method: Circle system utilized Preoxygenation: Pre-oxygenation with 100% oxygen Intubation Type: IV induction Ventilation: Mask ventilation without difficulty and Oral airway inserted - appropriate to patient size Laryngoscope Size: Glidescope and 4 (HX of cervical fusion) Grade View: Grade I Tube type: Oral Number of attempts: 1 Airway Equipment and Method: Stylet Placement Confirmation: ETT inserted through vocal cords under direct vision,  breath sounds checked- equal and bilateral and positive ETCO2 Secured at: 23 cm Tube secured with: Tape Dental Injury: Teeth and Oropharynx as per pre-operative assessment

## 2015-04-25 NOTE — H&P (Signed)
Ryan Butler is an 64 y.o. male.   Chief Complaint: Back and leg pain HPI: Patient is a very pleasant 89 shows him as of rest worsening back and bilateral leg pain worse on the left radiating down L5 and S1 nerve root pattern. Workup revealed severe spinal stenosis or disc disease and instability L4-5 and L5-S1. Due to patient's failure conservative treatment imaging findings and progression of clinical syndrome I recommended decompressive laminectomies and fusion L4-5 L5-S1. I extensively reviewed the risks and benefits of the operation the patient as well as perioperative course expectations of outcome and alternatives of surgery and he understood and agreed to proceed forward.  Past Medical History  Diagnosis Date  . Essential hypertension, benign   . Carotid artery disease ([place])   . Closed head injury 1996  . Polysubstance abuse     History of cocaine in his 71's  . Cervical spondylosis   . History of colonic polyps   . History of seizures   . History of TIAs   . Type 2 diabetes mellitus ([place])     Pt stopped metformin 09/2013  . History of scarlet fever     Past Surgical History  Procedure Laterality Date  . Left carotid endarterectomy      6/06 - Dr. Scot Dock  . Tibia/fibula fracture repair    . Hand laceration repair    . Vascular surgery    . Skin graft to leg Left   . Fracture surgery    . Anterior cervical decomp/discectomy fusion N/A 12/05/2013    Procedure: ANTERIOR CERVICAL DECOMPRESSION/DISCECTOMY FUSION 2 LEVELS;  Surgeon: Elaina Hoops, MD;  Location: [place] [facility];  Service: Neurosurgery;  Laterality: N/A;  Anterior Cervical Discectomy and Fusion with PEEK Cages and allograft Cervical Four-Five/Five-Six    Family History  Problem Relation Age of Onset  . Cancer Father   . Coronary artery disease Mother    Social History:  reports that he has been smoking Cigarettes.  He has a 110 pack-year smoking history. He has never used smokeless tobacco. He reports that he  drinks alcohol. He reports that he uses illicit drugs (Marijuana and Oxycodone).  Allergies:  Allergies  Allergen Reactions  . Hydrocodone Nausea Only    Medications Prior to Admission  Medication Sig Dispense Refill  . amitriptyline (ELAVIL) 150 MG tablet Take 150 mg by mouth at bedtime as needed for sleep.     Marland Kitchen aspirin (ASPIRIN EC) 81 MG EC tablet Take 81 mg by mouth daily. Swallow whole.    . losartan (COZAAR) 25 MG tablet Take 25 mg by mouth daily.      Results for orders placed or performed during the hospital encounter of 04/25/15 (from the past 48 hour(s))  Glucose, capillary     Status: Abnormal   Collection Time: 04/25/15 11:27 AM  Result Value Ref Range   Glucose-Capillary 137 (H) 65 - 99 mg/dL   No results found.  Review of Systems  Constitutional: Negative.   HENT: Negative.   Eyes: Negative.   Respiratory: Negative.   Cardiovascular: Negative.   Gastrointestinal: Negative.   Genitourinary: Negative.   Musculoskeletal: Positive for myalgias, back pain and joint pain.  Skin: Negative.   Neurological: Positive for tingling and sensory change.    Blood pressure 142/81, pulse 68, temperature 97.5 F (36.4 C), temperature source Oral, resp. rate 18, height $RemoveBe'5\' 10"'csmPHdmvf$  (1.778 m), weight 71.305 kg (157 lb 3.2 oz), SpO2 100 %. Physical Exam  Constitutional: He is oriented to  person, place, and time. He appears well-developed and well-nourished.  HENT:  Head: Normocephalic and atraumatic.  Eyes: Pupils are equal, round, and reactive to light.  Neck: Normal range of motion.  Respiratory: Effort normal.  GI: Soft.  Neurological: He is alert and oriented to person, place, and time. He has normal strength. GCS eye subscore is 4. GCS verbal subscore is 5. GCS motor subscore is 6.  Strength 5 out of 5 iliopsoas, quads, hamstrings gastrocs anterior tibialis, EHL.  Skin: Skin is warm and dry.     Assessment/Plan 64 to determine presents for decompression stabilization  procedure at L4-5 and L5-S1.  Ameliyah Sarno P 04/25/2015, 12:40 PM

## 2015-04-25 NOTE — Anesthesia Postprocedure Evaluation (Signed)
Anesthesia Post Note  Patient: Ryan Butler  Procedure(s) Performed: Procedure(s) (LRB): POSTERIOR LUMBAR Interbody FUSION with Pedicle Screw Fixation Lumbar Four-Five/ Lumbar Five-Sacral One (N/A)  Patient location during evaluation: PACU Anesthesia Type: General Level of consciousness: sedated, oriented, patient cooperative and responds to stimulation Pain management: pain level controlled Vital Signs Assessment: post-procedure vital signs reviewed and stable Respiratory status: spontaneous breathing, nonlabored ventilation, respiratory function stable and patient connected to nasal cannula oxygen Cardiovascular status: blood pressure returned to baseline and stable Postop Assessment: no signs of nausea or vomiting Anesthetic complications: no    Last Vitals:  Filed Vitals:   04/25/15 1915 04/25/15 1930  BP: 167/86 162/82  Pulse: 65 63  Temp:    Resp:      Last Pain:  Filed Vitals:   04/25/15 1943  PainSc: 8     LLE Motor Response: Responds to commands, Purposeful movement LLE Sensation: Full sensation, No numbness, No pain, No tingling RLE Motor Response: Responds to commands, Purposeful movement RLE Sensation: Full sensation, No numbness, No pain, No tingling      Yentl Verge,E. Raven Furnas

## 2015-04-25 NOTE — Anesthesia Preprocedure Evaluation (Addendum)
Anesthesia Evaluation  Patient identified by MRN, date of birth, ID band Patient awake    Reviewed: Allergy & Precautions, NPO status , Patient's Chart, lab work & pertinent test results  Airway Mallampati: II  TM Distance: >3 FB Neck ROM: Limited    Dental   Pulmonary Current Smoker,    Pulmonary exam normal        Cardiovascular hypertension, Pt. on medications + Peripheral Vascular Disease  Normal cardiovascular exam Rhythm:Regular     Neuro/Psych  Headaches, PSYCHIATRIC DISORDERS Anxiety    GI/Hepatic   Endo/Other  diabetes, Type 2, Oral Hypoglycemic Agents  Renal/GU      Musculoskeletal   Abdominal   Peds  Hematology   Anesthesia Other Findings   Reproductive/Obstetrics                           Anesthesia Physical Anesthesia Plan  ASA: III  Anesthesia Plan: General   Post-op Pain Management:    Induction: Intravenous  Airway Management Planned: Oral ETT  Additional Equipment:   Intra-op Plan:   Post-operative Plan: Extubation in OR  Informed Consent: I have reviewed the patients History and Physical, chart, labs and discussed the procedure including the risks, benefits and alternatives for the proposed anesthesia with the patient or authorized representative who has indicated his/her understanding and acceptance.   Dental advisory given  Plan Discussed with: Surgeon and CRNA  Anesthesia Plan Comments:        Anesthesia Quick Evaluation

## 2015-04-25 NOTE — Progress Notes (Signed)
Pt admitted from PACU post surgery, alert and oriented, pt settled in bed, call light at bedside, will however continue to monitor. Ryan Butler, Ryan Butler

## 2015-04-25 NOTE — Op Note (Signed)
Preoperative diagnosis: Lumbar spinal stenosis lateral recess stenosis and radiculopathy L4-5 L5-S1  Postoperative diagnosis: Same   Procedure: #1Decompressive lumbar laminectomy complete medial facetectomies radical foraminotomy as of the L4-5 and S1 nerve roots bilaterally in excess and requiring more work than would be needed with a standard interbody fusion  #2 posterior lumbar interbody fusion using the globus rise expandable cage system utilizing locally harvested autograft mixed with allostem morsels  #3 cortical screw fixation L4-S1 utilizing the globus Creo cortical screw set  #4 placement of large Hemovac drain  Surgeon: Dominica Severin Gautam Langhorst  Asst.: Dayton Bailiff  Anesthesia: Gen.  EBL: Middle  History of present illness: Patient is very pleasant 64 year old gentleman is a progress worsening back and bilateral leg pain is been refractory to all forms of conservative treatment last several months and years. Workup has revealed severe spinal stenosis or disc disease marked facet arthropathy at L4-5 and L5-S1. Patient failed all forms of conservative treatment is imaging findings and progressive conical syndrome I recommended decompressive laminectomy and interbody fusion at L4-5 and L5-S1. I extensively went over the risks and benefits of the operation with the patient as well as very operative course expectations of outcome and alternatives surgery and he understands and agrees to proceed forward  Operative procedure: Patient brought into the or was induced under general anesthesia positioned prone on the [place]is[place]old table his back was prepped and draped in routine sterile fashion. Pressure localize the appropriate level so after infiltration of 10 mL lidocaine with epi a midline incision was made and Bovie cautery was used to densities she's doing subperiosteal dissections care lamina of L4 and L5 super aspect of S1 down to the facet complex and up to the facet complex at L3-4. Then using a  combination of AP and lateral fluoroscopy the entry point pilot holes were used to drill at the 5 and 7:00 positions of the L4 pedicle these pedicles were drilled probed Probed and 60/50 35 mm screws were inserted bilaterally at L4. After this was taken and the fluoroscopy confirmed good position both in AP and lateral projection I removed the spinous processes at L4 and L5 complete central decompression was begun there was marked stenosis at L4-5 with severe hourglass compression of thecal sac. With complete facetectomies is decompress the central canal did aggressive abutting of the superior tickling facet of both levels to gain access to the lateral margins disc space and performed a radical laminotomy to the L4-L5 and S1 nerve roots bilaterally. Then working in the disc space cleaned out both sides using sequential distraction with a 10 distractor in Place at L4-5 by inserted a 10 test 17 expandable 15 lordotic cage and opened up about 4 turns to approximately 11-12 mm after was packed with local autograft mixed. This significantly opened up the disc space restored some lordosis and I removed the distractor at the next level I did a similar thing after prep adequate preparation of the space and endplates with a 10 distractor in place I placed an 814 expandable cage after was packed with local autograft mixed. Contralateral sides were prepared in a similar fashion a lot of month a lot of central disc was removed at both levels and then local autograft was packed centrally contralateral cages were inserted. Fluoroscopy confirmed good position of all the implants. Then I placed the cortical screws at L5 and S1 with 35 mm screws at L5 and a 1 size larger 6555 35 mm screw at S1. All screws had excellent purchase posterior  fluoroscopy confirmed good position of all the implants the wounds and copious irrigated meticulous in space was maintained all the foraminal reinspected confirm patency Gelfoam was laid up the  dura the heads reassembled on the L4 screw. 52mm only rods were then placed top tightening nuts were anchored in place then I placed a Hemovac drain and closed the wound in layers with interrupted Vicryl and a running 4 subcuticular and skin. Dermabond benzo and Steri-Strips were applied wound was dressed patient recovered in stable condition. At the end of case all needle counts and sponge counts were correct.

## 2015-04-26 LAB — GLUCOSE, CAPILLARY
GLUCOSE-CAPILLARY: 181 mg/dL — AB (ref 65–99)
Glucose-Capillary: 132 mg/dL — ABNORMAL HIGH (ref 65–99)
Glucose-Capillary: 157 mg/dL — ABNORMAL HIGH (ref 65–99)
Glucose-Capillary: 133 mg/dL — ABNORMAL HIGH (ref 65–99)

## 2015-04-26 MED ORDER — TIZANIDINE HCL 4 MG PO TABS: 4.0000 mg | ORAL_TABLET | Freq: Three times a day (TID) | ORAL | Status: DC

## 2015-04-26 MED ORDER — OXYCODONE-ACETAMINOPHEN 5-325 MG PO TABS: 1.0000 | ORAL_TABLET | ORAL | Status: DC | PRN

## 2015-04-26 MED FILL — Heparin Sodium (Porcine) Inj 1000 Unit/ML: INTRAMUSCULAR | Qty: 30 | Status: AC

## 2015-04-26 MED FILL — Sodium Chloride IV Soln 0.9%: INTRAVENOUS | Qty: 1000 | Status: AC

## 2015-04-26 NOTE — Evaluation (Signed)
Physical Therapy Evaluation and Discharge Patient Details Name: Ryan Butler MRN: [id number] DOB: December 18, 1950 Today's Date: 04/26/2015   History of Present Illness  Pt is a 64 y/o male who presents s/p L4-S1 PLIF on 04/25/15.  Clinical Impression  Patient evaluated by Physical Therapy with no further acute PT needs identified. All education has been completed and the patient has no further questions. At the time of PT eval pt was able to perform transfers and ambulation without an AD. Pt demonstrated the ability to negotiate stairs with 1 railing with safe technique. Pt will be discharging to his daughter's home and she will be available 24 hours. See below for any follow-up Physical Therapy or equipment needs. PT is signing off. Thank you for this referral.     Follow Up Recommendations Outpatient PT    Equipment Recommendations  None recommended by PT    Recommendations for Other Services       Precautions / Restrictions Precautions Precautions: Fall;Back Precaution Booklet Issued: Yes (comment) Precaution Comments: Reviewed all back precautions and  Restrictions Weight Bearing Restrictions: No      Mobility  Bed Mobility               General bed mobility comments: Pt received in chair on arrival  Transfers Overall transfer level: Modified independent Equipment used: Rolling walker (2 wheeled);None Transfers: Sit to/from Stand Sit to Stand: Min guard         General transfer comment: Pt demonstrated proper hand placement and safety awareness.   Ambulation/Gait Ambulation/Gait assistance: Modified independent (Device/Increase time) Ambulation Distance (Feet): 350 Feet Assistive device: Rolling walker (2 wheeled);None Gait Pattern/deviations: Step-through pattern;Decreased stride length;Trunk flexed Gait velocity: Decreased Gait velocity interpretation: Below normal speed for age/gender General Gait Details: Pt ambulated initially with RW and then progressed  to no AD. Pt walking more naturally without RW.   Stairs Stairs: Yes Stairs assistance: Supervision Stair Management: One rail Right;Forwards Number of Stairs: 6 (2 steps x3) General stair comments: VC's for sequencing and safety  Wheelchair Mobility    Modified Rankin (Stroke Patients Only)       Balance Overall balance assessment: Needs assistance Sitting-balance support: Feet supported;No upper extremity supported Sitting balance-Leahy Scale: Fair     Standing balance support: During functional activity;No upper extremity supported Standing balance-Leahy Scale: Fair                               Pertinent Vitals/Pain Pain Assessment: 0-10 Pain Score: 5  Pain Location: Back Pain Descriptors / Indicators: Operative site guarding;Discomfort Pain Intervention(s): Limited activity within patient's tolerance;Monitored during session;Repositioned    Home Living Family/patient expects to be discharged to:: Private residence Living Arrangements: Children (Daughter) Available Help at Discharge: Family;Available 24 hours/day Type of Home: House Home Access: Stairs to enter Entrance Stairs-Rails: Right Entrance Stairs-Number of Steps: 3 Home Layout: One level Home Equipment: Walker - 2 wheels;Grab bars - tub/shower;Hand held shower head;Shower seat;Hospital bed;Other (comment) (Reacher) Additional Comments: Will be going to his daughter's house and will bring shower seat, RW, and reacher. Has hospital bed at his house from family member.    Prior Function Level of Independence: Independent         Comments: Likes to hunt and fish. Has a trip planned on the 26th and wants to be able to drive,     Hand Dominance   Dominant Hand: Right    Extremity/Trunk Assessment   Upper Extremity  Assessment: Defer to OT evaluation           Lower Extremity Assessment: Overall WFL for tasks assessed      Cervical / Trunk Assessment: Kyphotic (Forward head  posture)  Communication   Communication: HOH  Cognition Arousal/Alertness: Awake/alert Behavior During Therapy: WFL for tasks assessed/performed Overall Cognitive Status: Within Functional Limits for tasks assessed       Memory: Decreased recall of precautions              General Comments General comments (skin integrity, edema, etc.): Hemovac intact    Exercises        Assessment/Plan    PT Assessment Patent does not need any further PT services  PT Diagnosis Difficulty walking;Acute pain   PT Problem List    PT Treatment Interventions DME instruction;Gait training;Stair training;Functional mobility training;Therapeutic activities;Therapeutic exercise;Neuromuscular re-education;Patient/family education   PT Goals (Current goals can be found in the Care Plan section) Acute Rehab PT Goals Patient Stated Goal: to go home and get back to hunting and fishing PT Goal Formulation: With patient/family Time For Goal Achievement: 05/10/15 Potential to Achieve Goals: Good    Frequency Min 5X/week   Barriers to discharge        Co-evaluation               End of Session Equipment Utilized During Treatment: Back brace Activity Tolerance: Patient tolerated treatment well Patient left: in chair;with call bell/phone within reach;with chair alarm set;with family/visitor present Nurse Communication: Mobility status         Time: 5686-1683 PT Time Calculation (min) (ACUTE ONLY): 13 min   Charges:   PT Evaluation $Initial PT Evaluation Tier I: 1 Procedure     PT G CodesRolinda Roan May 23, 2015, 1:48 PM   Rolinda Roan, PT, DPT Acute Rehabilitation Services Pager: [phone]

## 2015-04-26 NOTE — Progress Notes (Signed)
Occupational Therapy Evaluation Patient Details Name: Ryan Butler MRN: [id number] DOB: [birth year] Today's Date: 04/26/2015    History of Present Illness Pt is a 64 y/o male who presents s/p L4-S1 PLIF on 04/25/15.   Clinical Impression   Pt is doing very well and is eager to go home. PTA, pt was independent with all ADL and mobility. Pt currently requires min guard assist for mobility and mod assist for LB ADL. Educated Sports coach on AE - daughter stated that they will not need it because she will be available 24/7 to assist him. Educated pt on back precautions and he required verbal cues to follow them during functional tasks. Also educated pt and daughter on fall prevention strategies, brace wear schedule, and shower protocol. Recommending No OT f/u as pt as great family support. Will continue to follow acutely to address all OT needs and goals.      Follow Up Recommendations  No OT follow up;Supervision - Intermittent    Equipment Recommendations  None recommended by OT    Recommendations for Other Services       Precautions / Restrictions Precautions Precautions: Fall;Back Precaution Booklet Issued: Yes (comment) Precaution Comments: Reviewed all back precautions and  Restrictions Weight Bearing Restrictions: No      Mobility Bed Mobility               General bed mobility comments: Pt received in chair on arrival  Transfers Overall transfer level: Needs assistance Equipment used: Rolling walker (2 wheeled) Transfers: Sit to/from Stand Sit to Stand: Min guard         General transfer comment: Verbal cues for safe hand placement on seated surfaces and min guard assist for safety due to slight unsteadiness.    Balance Overall balance assessment: Needs assistance Sitting-balance support: No upper extremity supported;Feet supported Sitting balance-Leahy Scale: Fair     Standing balance support: Bilateral upper extremity supported;During functional  activity Standing balance-Leahy Scale: Fair                              ADL Overall ADL's : Needs assistance/impaired     Grooming: Wash/dry face;Min guard;Standing               Lower Body Dressing: Moderate assistance;Cueing for back precautions;Cueing for compensatory techniques;Sitting/lateral leans;Sit to/from stand   Toilet Transfer: Min guard;Cueing for sequencing;Regular Toilet;Grab bars;RW;Ambulation;Cueing for Chief of Staff Details (indicate cue type and reason): Verbal cues for compensatroy strategies and for back precautions Toileting- Clothing Manipulation and Hygiene: Min guard;Cueing for safety;Cueing for sequencing;Sit to/from stand Toileting - Clothing Manipulation Details (indicate cue type and reason): Verbal cues for back precautions     Functional mobility during ADLs: Min guard;Cueing for safety;Rolling walker General ADL Comments: Pt eager to go home as soon as possible. Pt completed transfers and ambulated with min guard assist for safety as pt was slightly unsteady. Pt unable to cross ankle-over-knee for LB ADL, but pt's daughter said she would help him complete these tasks. Educated pt on availability of AE if he wanted to be more independent with these tasks - he declined. Discussed compensatory strategies for oral care, peri care and for IADL tasks. Educated pt on fall prevention strategies (rugs, electrical cords, pets, small children, lighting).       Vision Vision Assessment?: No apparent visual deficits   Perception     Praxis      Pertinent Vitals/Pain Pain Assessment: 0-10 Pain Score:  5  Pain Location: Low back Pain Descriptors / Indicators: Sharp;Throbbing Pain Intervention(s): Limited activity within patient's tolerance;Monitored during session;Repositioned     Hand Dominance Right   Extremity/Trunk Assessment Upper Extremity Assessment Upper Extremity Assessment: Overall WFL for tasks assessed   Lower  Extremity Assessment Lower Extremity Assessment: Defer to PT evaluation   Cervical / Trunk Assessment Cervical / Trunk Assessment: Kyphotic   Communication Communication Communication: HOH   Cognition Arousal/Alertness: Awake/alert Behavior During Therapy: WFL for tasks assessed/performed Overall Cognitive Status: Within Functional Limits for tasks assessed       Memory: Decreased recall of precautions             General Comments       Exercises       Shoulder Instructions      Home Living Family/patient expects to be discharged to:: Private residence Living Arrangements: Children (Daughter) Available Help at Discharge: Family;Available 24 hours/day Type of Home: House Home Access: Stairs to enter [employer] of Steps: 3 Entrance Stairs-Rails: Right Home Layout: One level     Bathroom Shower/Tub: Tub/shower unit;Curtain Shower/tub characteristics: Architectural technologist: Standard     Home Equipment: Environmental consultant - 2 wheels;Grab bars - tub/shower;Hand held shower head;Shower seat;Hospital bed;Other (comment) (Reacher)   Additional Comments: Will be going to his daughter's house and will bring shower seat, RW, and reacher. Has hospital bed at his house from family member.      Prior Functioning/Environment Level of Independence: Independent        Comments: Likes to hunt and fish. Has a trip planned on the 26th and wants to be able to drive,    OT Diagnosis: Generalized weakness;Acute pain   OT Problem List: Decreased strength;Decreased range of motion;Decreased activity tolerance;Impaired balance (sitting and/or standing);Decreased safety awareness;Decreased knowledge of use of DME or AE;Decreased knowledge of precautions;Pain   OT Treatment/Interventions: Self-care/ADL training;DME and/or AE instruction;Therapeutic activities;Patient/family education;Balance training    OT Goals(Current goals can be found in the care plan section) Acute Rehab OT  Goals Patient Stated Goal: to go home and get back to hunting and fishing OT Goal Formulation: With patient Time For Goal Achievement: 05/10/15 Potential to Achieve Goals: Good ADL Goals Pt Will Perform Lower Body Bathing: with supervision;with caregiver independent in assisting;with adaptive equipment;sitting/lateral leans;sit to/from stand Pt Will Perform Lower Body Dressing: with supervision;with caregiver independent in assisting;with adaptive equipment;sitting/lateral leans;sit to/from stand Pt Will Perform Tub/Shower Transfer: Tub transfer;with supervision;ambulating;shower seat;grab bars;rolling walker Additional ADL Goal #1: Pt will independently don/doff back brace to increase safety during ADL tasks. Additional ADL Goal #2: Pt will verbalize 3/3 back precautions to increase safety with ADL tasks.  OT Frequency: Min 2X/week   Barriers to D/C:            Co-evaluation              End of Session Equipment Utilized During Treatment: Gait belt;Rolling walker;Back brace Nurse Communication: Mobility status;Precautions  Activity Tolerance: Patient tolerated treatment well Patient left: in chair;with chair alarm set;with family/visitor present;with call bell/phone within reach   Time: 1051-1129 OT Time Calculation (min): 38 min Charges:  OT General Charges $OT Visit: 1 Procedure OT Evaluation $Initial OT Evaluation Tier I: 1 Procedure OT Treatments $Self Care/Home Management : 23-37 mins G-Codes:    Redmond Baseman, OTR/L 05-09-15, 1:09 PM

## 2015-04-26 NOTE — Care Management Note (Addendum)
Case Management Note  Patient Details  Name: Ryan Butler MRN: [id number] Date of Birth: [birth year]  Subjective/Objective: Patient admitted with spinal stenosis. Patient underwent a L4-S1 PLIF. Patient is from home alone.                    Action/Plan: Awaiting PT/OT eval and recommendations. CM will continue to follow for discharge needs.   Expected Discharge Date:                  Expected Discharge Plan:     In-House Referral:     Discharge planning Services     Post Acute Care Choice:    Choice offered to:     DME Arranged:    DME Agency:     HH Arranged:    HH Agency:     Status of Service:  In process, will continue to follow  Medicare Important Message Given:    Date Medicare IM Given:    Medicare IM give by:    Date Additional Medicare IM Given:    Additional Medicare Important Message give by:     If discussed at [place] of Stay Meetings, dates discussed:    Additional Comments:  Pollie Friar, RN 04/26/2015, 11:24 AM

## 2015-04-26 NOTE — Discharge Instructions (Signed)
No lifting no bending no twisting no driving a riding a car unless he is coming back and forth to see me. Keep incision clean dry and intact.

## 2015-04-26 NOTE — Progress Notes (Signed)
Subjective: Patient reports no leg pain condition back pain  Objective: Vital signs in last 24 hours: Temp:  [97.5 F (36.4 C)-98.6 F (37 C)] 98.2 F (36.8 C) (12/01 0450) Pulse Rate:  [56-92] 92 (12/01 0450) Resp:  [13-22] 20 (12/01 0450) BP: (129-183)/(67-128) 129/83 mmHg (12/01 0450) SpO2:  [98 %-100 %] 98 % (12/01 0450) Weight:  [71.305 kg (157 lb 3.2 oz)-76.068 kg (167 lb 11.2 oz)] 76.068 kg (167 lb 11.2 oz) (11/30 1957)  Intake/Output from previous day: 11/30 0701 - 12/01 0700 In: 1920 [P.O.:420; I.V.:1500] Out: 5390 [Urine:4620; Drains:570; Blood:200] Intake/Output this shift: Total I/O In: 240 [P.O.:240] Out: -   strength out of 5 wound clean dry and intact  Lab Results: No results for input(s): WBC, HGB, HCT, PLT in the last 72 hours. BMET No results for input(s): NA, K, CL, CO2, GLUCOSE, BUN, CREATININE, CALCIUM in the last 72 hours.  Studies/Results: Dg Lumbar Spine 2-3 Views  04/25/2015  CLINICAL DATA:  POSTERIOR LUMBAR FUSION L4-5, L5-S1 EXAM: LUMBAR SPINE - 2-3 VIEW; DG C-ARM 61-120 MIN COMPARISON:  NONE FLUOROSCOPY TIME:  1 MINUTES 5 SECONDS FINDINGS: Intraoperative fluoroscopic spot images are provided. There is scratch at there are bilateral pedicle screws at L3, L4 and L5. There are interbody spacer devices at L3-4 and L4-5. IMPRESSION: PLIF L4-S1. Electronically Signed   By: Kathreen Devoid   On: 04/25/2015 17:18   Dg C-arm 61-120 Min  04/25/2015  CLINICAL DATA:  POSTERIOR LUMBAR FUSION L4-5, L5-S1 EXAM: LUMBAR SPINE - 2-3 VIEW; DG C-ARM 61-120 MIN COMPARISON:  NONE FLUOROSCOPY TIME:  1 MINUTES 5 SECONDS FINDINGS: Intraoperative fluoroscopic spot images are provided. There is scratch at there are bilateral pedicle screws at L3, L4 and L5. There are interbody spacer devices at L3-4 and L4-5. IMPRESSION: PLIF L4-S1. Electronically Signed   By: Kathreen Devoid   On: 04/25/2015 17:18    Assessment/Plan: Mobilized today with physical therapy drain output still  significant patient expressed 1 home however I said I didn't think that was likely today although it is ambulating voiding pain controlled on pills in the drink and cleared by physical therapy it may be an option.   LOS: 1 day     Dmetrius Ambs P 04/26/2015, 7:35 AM

## 2015-04-27 LAB — GLUCOSE, CAPILLARY
GLUCOSE-CAPILLARY: 144 mg/dL — AB (ref 65–99)
GLUCOSE-CAPILLARY: 146 mg/dL — AB (ref 65–99)
GLUCOSE-CAPILLARY: 132 mg/dL — AB (ref 65–99)

## 2015-04-27 MED ORDER — OXYCODONE-ACETAMINOPHEN 5-325 MG PO TABS: 1.0000 | ORAL_TABLET | Freq: Four times a day (QID) | ORAL | Status: DC | PRN

## 2015-04-27 MED ORDER — TIZANIDINE HCL 4 MG PO TABS: 4.0000 mg | ORAL_TABLET | Freq: Four times a day (QID) | ORAL | Status: DC | PRN

## 2015-04-27 NOTE — Progress Notes (Signed)
Discharge orders received. Pt educated on discharge instructions and verbalized understanding. Hemovac and IV removed. Pt dressed and belongings packed. Pt taken downstairs by staff via wheelchair.

## 2015-04-27 NOTE — Discharge Summary (Signed)
  Physician Discharge Summary  Patient ID: Ryan Butler MRN: [id number] DOB/AGE: [birth year] 64 y.o.  Admit date: 04/25/2015 Discharge date: 04/27/2015  Admission Diagnoses:spinal stenosis lumbar  Discharge Diagnoses:  Active Problems:   Spinal stenosis of lumbar region   Discharged Condition: good  Hospital Course: Mr. Catena was admitted to the hospital for an uncomplicated lumbar decompression and fusion. At L4/5, and L5/S1. He is ambulating, voiding, and tolerating a regular diet. His wound is clean, dry, and without signs of infection at discharge.  Treatments: surgery: #1Decompressive lumbar laminectomy complete medial facetectomies radical foraminotomy as of the L4-5 and S1 nerve roots bilaterally in excess and requiring more work than would be needed with a standard interbody fusion  #2 posterior lumbar interbody fusion using the globus rise expandable cage system utilizing locally harvested autograft mixed with allostem morsels  #3 cortical screw fixation L4-S1 utilizing the globus Creo cortical screw set    Discharge Exam: Blood pressure 115/51, pulse 86, temperature 97.7 F (36.5 C), temperature source Oral, resp. rate 20, height $RemoveBe'5\' 10"'LbmLkrEUO$  (1.778 m), weight 76.068 kg (167 lb 11.2 oz), SpO2 100 %. General appearance: alert, cooperative, appears stated age and mild distress Neurologic: Alert and oriented X 3, normal strength and tone. Normal symmetric reflexes. Normal coordination and gait  Disposition: 01-Home or Self Care DDD    Medication List    TAKE these medications        amitriptyline 150 MG tablet  Commonly known as:  ELAVIL  Take 150 mg by mouth at bedtime as needed for sleep.     aspirin EC 81 MG EC tablet  Generic drug:  aspirin  Take 81 mg by mouth daily. Swallow whole.     losartan 25 MG tablet  Commonly known as:  COZAAR  Take 25 mg by mouth daily.     oxyCODONE-acetaminophen 5-325 MG tablet  Commonly known as:  PERCOCET/ROXICET  Take 1-2  tablets by mouth every 4 (four) hours as needed for moderate pain.     oxyCODONE-acetaminophen 5-325 MG tablet  Commonly known as:  ROXICET  Take 1 tablet by mouth every 6 (six) hours as needed for severe pain.     tiZANidine 4 MG tablet  Commonly known as:  ZANAFLEX  Take 1 tablet (4 mg total) by mouth 3 (three) times daily.     tiZANidine 4 MG tablet  Commonly known as:  ZANAFLEX  Take 1 tablet (4 mg total) by mouth every 6 (six) hours as needed for muscle spasms.           Follow-up Information    Follow up with [facility] P, MD.   Specialty:  Neurosurgery   Contact information:   [address] [place] [address] [phone]       Follow up In 2 weeks.      Signed: Gorje Iyer L 04/27/2015, 5:55 PM

## 2015-05-31 DIAGNOSIS — M5137 Other intervertebral disc degeneration, lumbosacral region: Secondary | ICD-10-CM | POA: Diagnosis not present

## 2015-06-07 DIAGNOSIS — Z7189 Other specified counseling: Secondary | ICD-10-CM | POA: Diagnosis not present

## 2015-06-07 DIAGNOSIS — J449 Chronic obstructive pulmonary disease, unspecified: Secondary | ICD-10-CM | POA: Diagnosis not present

## 2015-06-07 DIAGNOSIS — E119 Type 2 diabetes mellitus without complications: Secondary | ICD-10-CM | POA: Diagnosis not present

## 2015-06-07 DIAGNOSIS — Z720 Tobacco use: Secondary | ICD-10-CM | POA: Diagnosis not present

## 2015-07-03 DIAGNOSIS — M5137 Other intervertebral disc degeneration, lumbosacral region: Secondary | ICD-10-CM | POA: Diagnosis not present

## 2015-07-05 LAB — IFOBT (OCCULT BLOOD): IMMUNOLOGICAL FECAL OCCULT BLOOD TEST: NEGATIVE

## 2015-10-02 DIAGNOSIS — M5137 Other intervertebral disc degeneration, lumbosacral region: Secondary | ICD-10-CM | POA: Diagnosis not present

## 2015-10-02 DIAGNOSIS — M5416 Radiculopathy, lumbar region: Secondary | ICD-10-CM | POA: Diagnosis not present

## 2015-10-18 DIAGNOSIS — M5137 Other intervertebral disc degeneration, lumbosacral region: Secondary | ICD-10-CM | POA: Diagnosis not present

## 2015-10-24 ENCOUNTER — Other Ambulatory Visit (HOSPITAL_COMMUNITY): Payer: Self-pay | Admitting: Neurosurgery

## 2015-10-24 DIAGNOSIS — M5137 Other intervertebral disc degeneration, lumbosacral region: Secondary | ICD-10-CM

## 2015-11-15 ENCOUNTER — Ambulatory Visit (HOSPITAL_COMMUNITY)
Admission: RE | Admit: 2015-11-15 | Discharge: 2015-11-15 | Disposition: A | Discharge status: Home / Self Care | Payer: Medicare Other | Source: Ambulatory Visit | Attending: Neurosurgery | Admitting: Neurosurgery

## 2015-11-15 DIAGNOSIS — M5137 Other intervertebral disc degeneration, lumbosacral region: Secondary | ICD-10-CM | POA: Diagnosis not present

## 2015-11-15 DIAGNOSIS — Z9889 Other specified postprocedural states: Secondary | ICD-10-CM | POA: Diagnosis not present

## 2015-11-15 DIAGNOSIS — M5126 Other intervertebral disc displacement, lumbar region: Secondary | ICD-10-CM | POA: Diagnosis not present

## 2015-11-30 ENCOUNTER — Ambulatory Visit (INDEPENDENT_AMBULATORY_CARE_PROVIDER_SITE_OTHER): Payer: Medicare Other | Admitting: Family Medicine

## 2015-11-30 ENCOUNTER — Encounter: Payer: Self-pay | Admitting: Family Medicine

## 2015-11-30 VITALS — BP 150/82 | HR 81 | Temp 98.3°F | Resp 12 | Ht 70.0 in | Wt 154.0 lb

## 2015-11-30 DIAGNOSIS — I10 Essential (primary) hypertension: Secondary | ICD-10-CM

## 2015-11-30 DIAGNOSIS — I739 Peripheral vascular disease, unspecified: Secondary | ICD-10-CM | POA: Diagnosis not present

## 2015-11-30 DIAGNOSIS — E1140 Type 2 diabetes mellitus with diabetic neuropathy, unspecified: Secondary | ICD-10-CM

## 2015-11-30 DIAGNOSIS — E1165 Type 2 diabetes mellitus with hyperglycemia: Secondary | ICD-10-CM | POA: Diagnosis not present

## 2015-11-30 DIAGNOSIS — G894 Chronic pain syndrome: Secondary | ICD-10-CM | POA: Insufficient documentation

## 2015-11-30 DIAGNOSIS — IMO0002 Reserved for concepts with insufficient information to code with codable children: Secondary | ICD-10-CM

## 2015-11-30 DIAGNOSIS — E782 Mixed hyperlipidemia: Secondary | ICD-10-CM

## 2015-11-30 DIAGNOSIS — G4700 Insomnia, unspecified: Secondary | ICD-10-CM

## 2015-11-30 LAB — MICROALBUMIN / CREATININE URINE RATIO
Creatinine,U: 106.3 mg/dL
MICROALB UR: 1.1 mg/dL (ref 0.0–1.9)
MICROALB/CREAT RATIO: 1 mg/g (ref 0.0–30.0)

## 2015-11-30 LAB — LIPID PANEL
CHOL/HDL RATIO: 5
Cholesterol: 221 mg/dL — ABNORMAL HIGH (ref 0–200)
HDL: 43.5 mg/dL (ref >39.00)
LDL Cholesterol: 158 mg/dL — ABNORMAL HIGH (ref 0–99)
NONHDL: 177.22
Triglycerides: 97 mg/dL (ref 0.0–149.0)
VLDL: 19.4 mg/dL (ref 0.0–40.0)

## 2015-11-30 LAB — COMPREHENSIVE METABOLIC PANEL
ALK PHOS: 118 U/L — AB (ref 39–117)
ALT: 14 U/L (ref 0–53)
AST: 15 U/L (ref 0–37)
Albumin: 4.6 g/dL (ref 3.5–5.2)
BILIRUBIN TOTAL: 0.7 mg/dL (ref 0.2–1.2)
BUN: 15 mg/dL (ref 6–23)
CO2: 32 meq/L (ref 19–32)
CREATININE: 0.94 mg/dL (ref 0.40–1.50)
Calcium: 9.9 mg/dL (ref 8.4–10.5)
Chloride: 102 mEq/L (ref 96–112)
GFR: 84.98 mL/min (ref >60.00)
GLUCOSE: 204 mg/dL — AB (ref 70–99)
Potassium: 3.9 mEq/L (ref 3.5–5.1)
Sodium: 140 mEq/L (ref 135–145)
TOTAL PROTEIN: 7.4 g/dL (ref 6.0–8.3)

## 2015-11-30 LAB — HEMOGLOBIN A1C: HEMOGLOBIN A1C: 7.4 % — AB (ref 4.6–6.5)

## 2015-11-30 MED ORDER — AMLODIPINE BESYLATE 5 MG PO TABS: 5.0000 mg | ORAL_TABLET | Freq: Every day | ORAL | Status: DC

## 2015-11-30 MED ORDER — LOSARTAN POTASSIUM 25 MG PO TABS: 25.0000 mg | ORAL_TABLET | Freq: Every day | ORAL | Status: DC

## 2015-11-30 NOTE — Progress Notes (Signed)
Pre visit review using our clinic review tool, if applicable. No additional management support is needed unless otherwise documented below in the visit note. 

## 2015-11-30 NOTE — Patient Instructions (Signed)
A few things to remember from today's visit:   Uncontrolled type 2 diabetes mellitus with diabetic neuropathy, without long-term current use of insulin (HCC)  PVD (peripheral vascular disease) (HCC)  Essential hypertension, benign  Mixed hyperlipidemia  Insomnia, unspecified  Chronic pain disorder  Today and the medication for blood pressure was added, Cozaar 25 mg daily. Continue amlodipine. Low salt diet and smoking cessation strongly recommended. Continue aspirin 81 mg daily. Cholesterol medication is indicated for patients who have clotted arteries, so we need to consider it in the future.  I will not be managing her pain, please let me know if you are interested in another pain clinic referral.  You need to have an eye exam once per year. Be sure to check her feet periodically. Dental exam to prevent teeth infections is also recommended.  HgA1C goal < 7.0.  Avoid sugar added food:regular soft drinks, energy drinks, and sports drinks. candy. cakes. cookies. pies and cobblers. sweet rolls, pastries, and donuts. fruit drinks, such as fruitades and fruit punch. dairy desserts, such as ice cream  Mediterranean diet has showed benefits for sugar control.  How much and what type of carbohydrate foods are important for managing diabetes. The balance between how much insulin is in your body and the carbohydrate you eat makes a difference in your blood glucose levels.  Fasting blood sugar ideally 130 or less, 2 hours after meals less than 180.   Regular exercise also will help with controlling disease, daily brisk walking as tolerated for 15-30 min definitively will help.   Avoid skipping meals, blood sugar might drop and cause serious problems.      Please be sure medication list is accurate. If a new problem present, please set up appointment sooner than planned today.      We have ordered labs or studies at this visit.  It can take up to 1-2 weeks for results and  processing. IF results require follow up or explanation, we will call you with instructions. Clinically stable results will be released to your [facility]. If you have not heard from [place] or cannot find your results in [facility] in 2 weeks please contact our office at [phone].  If you are not yet signed up for [facility], please consider signing up

## 2015-11-30 NOTE — Progress Notes (Signed)
HPI:   [name] is a 65 y.o. male, who is here today to establish care with me.  Former PCP: Not sure.  Last preventive routine visit: not sure.  Hx of generalized OA and back pain with radiculopathy.  He follows with Dr Ryan Butler, neurosurgeon,next appt in a few weeks. He tells me that he was on Oxycodone for pain but could not afford it, so he took Percocet, as I understand he was referred to pain management but he "did not go back", he felt like he had to wait long and did not feel comfortable with the rules. He states that he is allergic to Hydrocodone and cannot take "arthritis medication".  32/9924 he had uncomplicated lumbar decompression and fusion. At L4/5, and L5/S1.  Hx of DM II, HTN, and HLD. He does not remember when was the last time he followed on this problems.   Hx of insomnia, which he attributes to pain. He takes Amitriptyline 150 mg as needed, he does not take it daily because he becomes "immune to it". He brought his records with him. He reports multiple surgeries, generalized arthralgias, burning sensation on upper back, along trapezium, numbness and tingling on arms and legs.   He  lives with sister. He states that he travels, "moves to a lot of places". Independent for most ADL's and IADL's. He does drive, does not have a cane for assistance, his sister takes care of his finances.    He falls in the past year and denies depression symptoms.   Concerns today: pain.   Diabetes Mellitus II:  Many years ago.   Heis currently on non pharmacologic treatment. Problem has been stable. He is checking BS's, denies any hypoglycemia, BS < 200.  He denies abdominal pain, nausea, vomiting, polydipsia, polyuria, or polyphagia. + numbness, tingling on upper and lower extremities.  Last eye exam 1-2 years ago.  Hx of PVD and HLD, s/p carotid artery endartectomy, he is not following with vascular. Still smoking, and not on statin. He is on Aspirin  81 mg daily.  He is on disability. He reports Hx of anxiety. Denies Hx of bipolar.  Smokes marijuana once in a while. Reports having a DUI  2 years ago. + Smoking. Denies alcohol abuse.   Hypertension: Currently he is on Amlodipine 5 mg, which he resumed about a month ago. He is taking medications as instructed, no side effects reported.  He has not noted unusual headache, visual changes, exertional chest pain, dyspnea,  focal weakness, or edema.  Lab Results  Component Value Date   CREATININE 0.92 04/16/2015   BUN 15 04/16/2015   NA 136 04/16/2015   K 3.6 04/16/2015   CL 102 04/16/2015   CO2 24 04/16/2015        Lab Results  Component Value Date   CREATININE 0.92 04/16/2015   BUN 15 04/16/2015   NA 136 04/16/2015   K 3.6 04/16/2015   CL 102 04/16/2015   CO2 24 04/16/2015    Lab Results  Component Value Date   HGBA1C 7.6* 04/16/2015      Review of Systems  Constitutional: Negative for fever, appetite change, fatigue and unexpected weight change.  HENT: Positive for rhinorrhea. Negative for mouth sores, nosebleeds, sore throat and trouble swallowing.   Eyes: Negative for pain, redness and visual disturbance.  Respiratory: Negative for apnea, cough, shortness of breath and wheezing.        + Smoker  Cardiovascular: Negative for chest  pain, palpitations and leg swelling.  Gastrointestinal: Negative for nausea, vomiting and abdominal pain.       No changes in bowel habits.  Endocrine: Negative for polydipsia, polyphagia and polyuria.  Genitourinary: Negative for dysuria, hematuria and decreased urine volume.  Musculoskeletal: Positive for back pain, arthralgias, gait problem and neck pain. Negative for joint swelling.  Skin: Negative for rash and wound.  Neurological: Positive for numbness and headaches (Occasionally). Negative for dizziness, seizures, syncope and weakness.  Psychiatric/Behavioral: Positive for sleep disturbance. Negative for hallucinations  and confusion. The patient is nervous/anxious.       Current Outpatient Prescriptions on File Prior to Visit  Medication Sig Dispense Refill  . amitriptyline (ELAVIL) 150 MG tablet Take 150 mg by mouth at bedtime as needed for sleep.     Marland Kitchen aspirin (ASPIRIN EC) 81 MG EC tablet Take 81 mg by mouth daily. Swallow whole.     No current facility-administered medications on file prior to visit.     Past Medical History  Diagnosis Date  . Essential hypertension, benign   . Carotid artery disease ([place])   . Closed head injury 1996  . Polysubstance abuse     History of cocaine in his 59's  . Cervical spondylosis   . History of colonic polyps   . History of seizures   . History of TIAs   . Type 2 diabetes mellitus ([place])     Pt stopped metformin 09/2013  . History of scarlet fever    Allergies  Allergen Reactions  . Hydrocodone Nausea Only    Family History  Problem Relation Age of Onset  . Cancer Father   . Coronary artery disease Mother     Social History   Social History  . Marital Status: Divorced    Spouse Name: N/A  . Number of Children: N/A  . Years of Education: N/A   Social History Main Topics  . Smoking status: Current Every Day Smoker -- 2.00 packs/day for 55 years    Types: Cigarettes  . Smokeless tobacco: Never Used     Comment: smokes 1/2 pack per day now; reports that he starting to quick  . Alcohol Use: Yes     Comment: drinks occasional reports former drinker  . Drug Use: Yes    Special: Marijuana, Oxycodone  . Sexual Activity: Not Asked   Other Topics Concern  . None   Social History Narrative    Filed Vitals:   11/30/15 0836  BP: 150/82  Pulse: 81  Temp: 98.3 F (36.8 C)  Resp: 12    Body mass index is 22.1 kg/(m^2).   SpO2 Readings from Last 3 Encounters:  11/30/15 99%  04/27/15 100%  04/16/15 97%     Physical Exam  Constitutional: He is oriented to person, place, and time. He appears well-developed and well-nourished. No  distress.  HENT:  Head: Atraumatic.  Mouth/Throat: Oropharynx is clear and moist. Mucous membranes are dry. Abnormal dentition (poor dentition and missing teeth).  Eyes: Conjunctivae and EOM are normal. Pupils are equal, round, and reactive to light.  Neck: No JVD present. No thyromegaly present.  Cardiovascular: Normal rate.  A regularly irregular rhythm present.  Occasional extrasystoles are present.  No murmur heard. Pulses:      Dorsalis pedis pulses are 2+ on the right side, and 2+ on the left side.  Respiratory: Effort normal and breath sounds normal. No respiratory distress.  GI: Soft. He exhibits no mass. There is no hepatomegaly. There is no  tenderness.  Musculoskeletal: He exhibits no edema.       Cervical back: He exhibits decreased range of motion and tenderness.  Pain elicited with shoulder range of motion and with  movement on exam table. Knees: Decreased range of motion, extension and flexion, moderate crepitus bilateral. No effusion.   Lymphadenopathy:    He has no cervical adenopathy.  Neurological: He is alert and oriented to person, place, and time. He has normal strength. Coordination normal.  Stable gait without assistance  Skin: Skin is warm. No erythema.  Psychiatric: His speech is normal. His mood appears anxious.  Poor groomed, poor eye contact.    Diabetic foot exam:  Monofilament decreased right foot. Peripheral pulses present (DP). + calluses + hypertrophic/long toenails.   ASSESSMENT AND PLAN:     Ryan Butler was seen today for new patient (initial visit).  Diagnoses and all orders for this visit:  Uncontrolled type 2 diabetes mellitus with diabetic neuropathy, without long-term current use of insulin ([place])  For now continue nonpharmacologic treatment. Further recommendations will be given based on lab results. Anxiety recommendations discussed, regular exercise as tolerated. We also discussed the importance of periodic eye exam, exam, and  dental care. Follow-up in 3-4 months.  -     Comprehensive metabolic panel -     Lipid panel -     Microalbumin/Creatinine Ratio, Urine -     Hemoglobin A1c  PVD (peripheral vascular disease) ([place])  He is not on statin medication. Strongly recommended quitting smoking, adverse effects discussed. Continue aspirin 81 mg daily.  -     Comprehensive metabolic panel -     Microalbumin/Creatinine Ratio, Urine  Essential hypertension, benign  Not well controlled. Possible complications of elevated BP discussed. Annual eye examination. F/u in 3-4 months.  -     Comprehensive metabolic panel -     losartan (COZAAR) 25 MG tablet; Take 1 tablet (25 mg total) by mouth daily. -     amLODipine (NORVASC) 5 MG tablet; Take 1 tablet (5 mg total) by mouth daily.  Mixed hyperlipidemia  Low-fat diet recommended. Further recommendations would be given according to lab results. Because history of DM 2 I will be recommending a statin,dose will depend on cholesterol level.  -     Comprehensive metabolic panel -     Lipid panel  Insomnia, unspecified  Good sleep hygiene. For now I'm not making changes on Amitriptyline but we need to consider decreasing and/or weaning off medication due to risk of side effects at this age.  Chronic pain disorder  I recommended Cymbalta, he states that he took it before and didn't help.  He is very ambiguous about his history of pain and past treatments, he cannot explain the specific reason he is not longer following with pain management, he states that he had to fill out appropriate papers and he had to carefully his medications with him all the time, this was annoying for him. I recommended stopping marijuana use, this will be a problem if he reestablish with pain clinic, he replies telling me that marijuana in the process of being visualized in [place], which I explained I'm not aware of. He adds that he is not a smoking as often as he used to because  he cannot afford it.            Betty G. Martinique, MD  [facility]. [place] office.

## 2015-12-06 ENCOUNTER — Other Ambulatory Visit: Payer: Self-pay | Admitting: Family Medicine

## 2015-12-06 MED ORDER — METFORMIN HCL 500 MG PO TABS: 500.0000 mg | ORAL_TABLET | Freq: Two times a day (BID) | ORAL | Status: DC

## 2015-12-06 MED ORDER — ATORVASTATIN CALCIUM 20 MG PO TABS: ORAL_TABLET | ORAL | Status: DC

## 2015-12-21 ENCOUNTER — Telehealth: Payer: Self-pay | Admitting: Family Medicine

## 2015-12-21 NOTE — Telephone Encounter (Signed)
Is there something else that we can send in?

## 2015-12-21 NOTE — Telephone Encounter (Signed)
The metformin has previously made the patient have altered mental status and seiziers. Is there another medication that he can take for his blood sugar?

## 2015-12-23 NOTE — Telephone Encounter (Signed)
Other options: Glipizide 5 mg 20 min before breakfast (caution with low BS's if skipping meals) or Januvia 50 mg daily.  Thanks, BJ

## 2015-12-24 MED ORDER — GLIPIZIDE 5 MG PO TABS: 5.0000 mg | ORAL_TABLET | Freq: Every day | ORAL | 3 refills | Status: DC

## 2015-12-24 NOTE — Telephone Encounter (Signed)
Left detailed message for patient letting him know about the cautions, and that Rx has been sent to pharmacy.

## 2016-01-22 ENCOUNTER — Telehealth: Payer: Self-pay | Admitting: Family Medicine

## 2016-01-22 DIAGNOSIS — I10 Essential (primary) hypertension: Secondary | ICD-10-CM

## 2016-01-22 MED ORDER — LOSARTAN POTASSIUM 25 MG PO TABS
25.0000 mg | ORAL_TABLET | Freq: Every day | ORAL | 0 refills | Status: DC
Start: 2016-01-22 — End: 2016-03-06

## 2016-01-22 NOTE — Telephone Encounter (Signed)
Pt needs new rx cozaar 25 mg #90 [facility] in [place]

## 2016-01-22 NOTE — Telephone Encounter (Signed)
Rx sent to pharmacy   

## 2016-02-01 ENCOUNTER — Telehealth: Payer: Self-pay | Admitting: *Deleted

## 2016-02-01 NOTE — Telephone Encounter (Signed)
error 

## 2016-02-11 ENCOUNTER — Other Ambulatory Visit: Payer: Self-pay | Admitting: Family Medicine

## 2016-02-11 MED ORDER — AMITRIPTYLINE HCL 150 MG PO TABS: 150.0000 mg | ORAL_TABLET | Freq: Every evening | ORAL | 0 refills | Status: DC | PRN

## 2016-02-20 ENCOUNTER — Ambulatory Visit (INDEPENDENT_AMBULATORY_CARE_PROVIDER_SITE_OTHER): Payer: Medicare Other | Admitting: Adult Health

## 2016-02-20 ENCOUNTER — Encounter: Payer: Self-pay | Admitting: Adult Health

## 2016-02-20 VITALS — BP 126/74 | Temp 98.6°F | Ht 70.0 in | Wt 159.4 lb

## 2016-02-20 DIAGNOSIS — S81802A Unspecified open wound, left lower leg, initial encounter: Secondary | ICD-10-CM | POA: Diagnosis not present

## 2016-02-20 MED ORDER — CEPHALEXIN 500 MG PO CAPS: 500.0000 mg | ORAL_CAPSULE | Freq: Four times a day (QID) | ORAL | 0 refills | Status: DC

## 2016-02-20 NOTE — Progress Notes (Signed)
   Subjective:    Patient ID: Ryan Butler, male    DOB: Dec 20, 1950, 65 y.o.   MRN: [id number]  HPI  65 year old male who presents to the office today for wound to his left leg. He reports that 4 days ago he first noticed the wound to his left leg. As thr week progressed the wound became bigger and he then noticed redness and warmth around the wound site. He has reported purulent drainage from the wound. He is unsure of how the wound came to be but does believe that it was from a spider bite.   He is s/p ( <10 years ago) skin graft to the site where the wound resides.   Does endorse warmness and chills that started 3 days ago. Denies any n/v/d  Review of Systems  Constitutional: Positive for chills and fever.  Respiratory: Negative.   Cardiovascular: Negative.   Skin: Positive for color change and wound.  All other systems reviewed and are negative.      Objective:   Physical Exam  Constitutional: He is oriented to person, place, and time. He appears well-developed and well-nourished. No distress.  HENT:  Head: Normocephalic and atraumatic.  Right Ear: External ear normal.  Left Ear: External ear normal.  Nose: Nose normal.  Mouth/Throat: Oropharynx is clear and moist. No oropharyngeal exudate.  Neck: Normal range of motion. Neck supple. No tracheal deviation present. No thyromegaly present.  Cardiovascular: Normal rate, regular rhythm, normal heart sounds and intact distal pulses.  Exam reveals no gallop and no friction rub.   No murmur heard. Pulmonary/Chest: Effort normal and breath sounds normal. No respiratory distress. He has no wheezes. He has no rales. He exhibits no tenderness.  Abdominal: Soft. Bowel sounds are normal.  Musculoskeletal: Normal range of motion. He exhibits no edema or deformity.  Neurological: He is alert and oriented to person, place, and time. He has normal reflexes. He displays normal reflexes. No cranial nerve deficit. He exhibits normal muscle  tone. Coordination normal.  Skin: Skin is warm and dry. No rash noted. He is not diaphoretic. There is erythema. No pallor.  Wound to left leg. Redness and warmth present. He does have a 0.5 inch x 0.5 inch area of eschar to the center of the wound.   Psychiatric: He has a normal mood and affect. His behavior is normal. Judgment and thought content normal.  Nursing note and vitals reviewed.      Assessment & Plan:  1. Wound of left leg, initial encounter - appears as brown recluse spider bite.  - Attempted to contact Dr. Crissie Reese who the patient had his surgery done by, but Dr. Harlow Mares no longer does wound care. Will refer to [facility] wound center - cephALEXin (KEFLEX) 500 MG capsule; Take 1 capsule (500 mg total) by mouth 4 (four) times daily.  Dispense: 40 capsule; Refill: 0 - AMB referral to wound care center - Follow up if no improvement   [employer]

## 2016-02-20 NOTE — Patient Instructions (Signed)
It was great meeting you today!  I have prescribed Keflex for your antibiotic. Take this as directed.   We have scheduled an appointment with wound care on Monday for debridement of the wound.   I would like you to follow up with me on Tuesday for a wound check

## 2016-02-25 ENCOUNTER — Encounter (HOSPITAL_BASED_OUTPATIENT_CLINIC_OR_DEPARTMENT_OTHER): Payer: Medicare Other | Attending: Internal Medicine

## 2016-02-25 DIAGNOSIS — L03116 Cellulitis of left lower limb: Secondary | ICD-10-CM | POA: Insufficient documentation

## 2016-02-25 DIAGNOSIS — I10 Essential (primary) hypertension: Secondary | ICD-10-CM | POA: Diagnosis not present

## 2016-02-25 DIAGNOSIS — I70248 Atherosclerosis of native arteries of left leg with ulceration of other part of lower left leg: Secondary | ICD-10-CM | POA: Diagnosis not present

## 2016-02-25 DIAGNOSIS — I890 Lymphedema, not elsewhere classified: Secondary | ICD-10-CM | POA: Insufficient documentation

## 2016-02-25 DIAGNOSIS — E11622 Type 2 diabetes mellitus with other skin ulcer: Secondary | ICD-10-CM | POA: Insufficient documentation

## 2016-02-25 DIAGNOSIS — L97822 Non-pressure chronic ulcer of other part of left lower leg with fat layer exposed: Secondary | ICD-10-CM | POA: Insufficient documentation

## 2016-02-25 DIAGNOSIS — F17200 Nicotine dependence, unspecified, uncomplicated: Secondary | ICD-10-CM | POA: Insufficient documentation

## 2016-02-29 ENCOUNTER — Ambulatory Visit: Payer: Medicare Other | Admitting: Family Medicine

## 2016-03-03 DIAGNOSIS — E11622 Type 2 diabetes mellitus with other skin ulcer: Secondary | ICD-10-CM | POA: Diagnosis not present

## 2016-03-06 ENCOUNTER — Ambulatory Visit (INDEPENDENT_AMBULATORY_CARE_PROVIDER_SITE_OTHER): Payer: Medicare Other | Admitting: Family Medicine

## 2016-03-06 ENCOUNTER — Encounter: Payer: Self-pay | Admitting: Family Medicine

## 2016-03-06 VITALS — BP 120/60 | HR 78 | Temp 98.1°F | Resp 12 | Ht 70.0 in | Wt 161.1 lb

## 2016-03-06 DIAGNOSIS — I739 Peripheral vascular disease, unspecified: Secondary | ICD-10-CM | POA: Diagnosis not present

## 2016-03-06 DIAGNOSIS — G4700 Insomnia, unspecified: Secondary | ICD-10-CM | POA: Diagnosis not present

## 2016-03-06 DIAGNOSIS — S81802D Unspecified open wound, left lower leg, subsequent encounter: Secondary | ICD-10-CM

## 2016-03-06 DIAGNOSIS — I10 Essential (primary) hypertension: Secondary | ICD-10-CM

## 2016-03-06 DIAGNOSIS — E1165 Type 2 diabetes mellitus with hyperglycemia: Secondary | ICD-10-CM

## 2016-03-06 DIAGNOSIS — G894 Chronic pain syndrome: Secondary | ICD-10-CM

## 2016-03-06 DIAGNOSIS — IMO0002 Reserved for concepts with insufficient information to code with codable children: Secondary | ICD-10-CM

## 2016-03-06 DIAGNOSIS — E1140 Type 2 diabetes mellitus with diabetic neuropathy, unspecified: Secondary | ICD-10-CM | POA: Diagnosis not present

## 2016-03-06 MED ORDER — GABAPENTIN 300 MG PO CAPS: 300.0000 mg | ORAL_CAPSULE | Freq: Every day | ORAL | 2 refills | Status: DC

## 2016-03-06 MED ORDER — LOSARTAN POTASSIUM 25 MG PO TABS: 25.0000 mg | ORAL_TABLET | Freq: Every day | ORAL | 2 refills | Status: DC

## 2016-03-06 MED ORDER — AMLODIPINE BESYLATE 5 MG PO TABS: 5.0000 mg | ORAL_TABLET | Freq: Every day | ORAL | 2 refills | Status: DC

## 2016-03-06 NOTE — Progress Notes (Signed)
Pre visit review using our clinic review tool, if applicable. No additional management support is needed unless otherwise documented below in the visit note. 

## 2016-03-06 NOTE — Patient Instructions (Signed)
A few things to remember from today's visit:   Uncontrolled type 2 diabetes mellitus with diabetic neuropathy, without long-term current use of insulin ([place]) - Plan: HgB A1c, Comprehensive metabolic panel  Essential hypertension, benign - Plan: Comprehensive metabolic panel  PVD (peripheral vascular disease) (HCC)  Insomnia, unspecified type - Plan: gabapentin (NEURONTIN) 300 MG capsule  Chronic pain disorder - Plan: gabapentin (NEURONTIN) 300 MG capsule  Wound of left lower extremity, subsequent encounter  Consider going to pain management if you want to resume independent medications. Gabapentin might help with burning pain as well as sleep. If amitriptyline is not helping his to be discontinued. Smoking cessation strongly recommended. Continue following with wound clinic, elevated blood sugars will slow down healing.  Please be sure medication list is accurate. If a new problem present, please set up appointment sooner than planned today.

## 2016-03-06 NOTE — Progress Notes (Signed)
HPI:   Ryan Butler is a 65 y.o. male, who is here today to follow on some of his chronic medical problems.  Since his last OV he has been seen here (02/20/16) because left lower extremity wound, which he believes was caused by brown spider bite. He is currently following with wound clinic, last visit was 2 days ago, next appointment with clinic 03/13/2016. He is reporting improvement of edema and erythema around wound, he completed abx treatment. Also states that wound is not longer draining purulent material.  He tells me that pain on LLE, throbbing/burning, severe; which he attributes to wound.  He has Hx of chronic back pain with radicular pain and s/p left tibia/fibula fracture with skin graft many years ago. 29/7989 he had uncomplicated lumbar decompression and fusion. At L4/5, and L5/S1.  He denies any fever, chills, cold extremity, or joint erythema/edema.   Diabetes Mellitus II:    Not taking hypoglycemic agent. He has not tolerated metformin well in the past, last time Glipizide was recommended.  Checking BS's : 200s Hypoglycemia: Denies  He denies abdominal pain, nausea, vomiting, polydipsia, polyuria, or polyphagia. + LE numbness, tingling, and burning.  + PVD.   Lab Results  Component Value Date   CREATININE 0.94 11/30/2015   BUN 15 11/30/2015   NA 140 11/30/2015   K 3.9 11/30/2015   CL 102 11/30/2015   CO2 32 11/30/2015    Lab Results  Component Value Date   HGBA1C 7.4 (H) 11/30/2015   Lab Results  Component Value Date   MICROALBUR 1.1 11/30/2015    Lab Results  Component Value Date   CHOL 221 (H) 11/30/2015   HDL 43.50 11/30/2015   LDLCALC 158 (H) 11/30/2015   TRIG 97.0 11/30/2015   CHOLHDL 5 11/30/2015     Hypertension: He is currently on Amlodipine and Cozaar. Denies severe/frequent headache, visual changes, chest pain, dyspnea, palpitation, claudication, focal weakness, or edema.  HLD, started Lipitor 20 mg  daily. Tolerating well. Trying to follow a low fat diet.   Concerns today: pain He has history of chronic pain, upper and lower back with radicular pain and generalized OA. Last OV I was planning on referring him to pain management, refused. "I will not go to pain clinic" He tells me that Percocet doesn't cause any drowsiness and it actually helps him sleep.  + Marijuana, states that it helps with pain. Prior Hx of cocaine use (about 30 years ago), denies any use recently. He tells me that he can get Oxycodone on the street if he does not get a Rx. Still smoking, states that he is smoking more due to pain.  Insomnia: Currently he is taking Amitriptyline 150 mg at night, he doesn't take medication daily, states that sometimes it doesn't help but when it does he sleeps about 3-4 hours. He feels like insomnia is getting worse due to pain of left lower extremity.    Review of Systems  Constitutional: Positive for fatigue. Negative for activity change, appetite change, fever and unexpected weight change.  HENT: Negative for mouth sores, nosebleeds, sore throat and trouble swallowing.   Eyes: Negative for pain, redness and visual disturbance.  Respiratory: Negative for apnea, cough, shortness of breath and wheezing.   Cardiovascular: Negative for chest pain, palpitations and leg swelling.  Gastrointestinal: Negative for abdominal pain, nausea and vomiting.       No changes in bowel movements  Endocrine: Negative for polydipsia, polyphagia and polyuria.  Genitourinary:  Negative for decreased urine volume, dysuria and hematuria.  Musculoskeletal: Positive for arthralgias, back pain and neck pain.  Skin: Positive for wound. Negative for rash.  Neurological: Positive for numbness. Negative for dizziness, seizures, syncope, weakness and headaches.  Psychiatric/Behavioral: Positive for sleep disturbance. Negative for confusion and hallucinations. The patient is nervous/anxious.        Current Outpatient Prescriptions on File Prior to Visit  Medication Sig Dispense Refill  . amitriptyline (ELAVIL) 150 MG tablet Take 1 tablet (150 mg total) by mouth at bedtime as needed for sleep. 90 tablet 0  . amLODipine (NORVASC) 5 MG tablet Take 1 tablet (5 mg total) by mouth daily. 90 tablet 1  . aspirin (ASPIRIN EC) 81 MG EC tablet Take 81 mg by mouth daily. Swallow whole.    Marland Kitchen atorvastatin (LIPITOR) 20 MG tablet Take 1 tablet by mouth with supper. 30 tablet 3  . glipiZIDE (GLUCOTROL) 5 MG tablet Take 1 tablet (5 mg total) by mouth daily before breakfast. (Patient not taking: Reported on 03/06/2016) 60 tablet 3   No current facility-administered medications on file prior to visit.      Past Medical History:  Diagnosis Date  . Carotid artery disease ([place])   . Cervical spondylosis   . Closed head injury 1996  . Essential hypertension, benign   . History of colonic polyps   . History of scarlet fever   . History of seizures   . History of TIAs   . Polysubstance abuse    History of cocaine in his 14's  . Type 2 diabetes mellitus ([place])    Pt stopped metformin 09/2013   Allergies  Allergen Reactions  . Hydrocodone Nausea Only    Social History   Social History  . Marital status: Divorced    Spouse name: N/A  . Number of children: N/A  . Years of education: N/A   Social History Main Topics  . Smoking status: Current Every Day Smoker    Packs/day: 2.00    Years: 55.00    Types: Cigarettes  . Smokeless tobacco: Never Used     Comment: smokes 1/2 pack per day now; reports that he starting to quick  . Alcohol use Yes     Comment: drinks occasional reports former drinker  . Drug use:     Types: Marijuana, Oxycodone  . Sexual activity: Not Asked   Other Topics Concern  . None   Social History Narrative  . None    Vitals:   03/06/16 1539  BP: 120/60  Pulse: 78  Resp: 12  Temp: 98.1 F (36.7 C)   O2 sat at RA 98%  Body mass index is 23.12  kg/m.     Physical Exam  Nursing note and vitals reviewed. Constitutional: He is oriented to person, place, and time. He appears well-developed and well-nourished. No distress.  HENT:  Head: Atraumatic.  Mouth/Throat: Oropharynx is clear and moist and mucous membranes are normal. Abnormal dentition (poor dentition and missing teeth).  Eyes: Conjunctivae and EOM are normal. Pupils are equal, round, and reactive to light.  Neck: No JVD present. No thyroid mass and no thyromegaly present.  Cardiovascular: Normal rate and regular rhythm.   No murmur heard. Pulses:      Dorsalis pedis pulses are 2+ on the right side, and 2+ on the left side.  Respiratory: Effort normal and breath sounds normal. No respiratory distress.  GI: Soft. He exhibits no mass. There is no hepatomegaly. There is no tenderness.  Musculoskeletal:  He exhibits no edema.  Neurological: He is alert and oriented to person, place, and time. He has normal strength. Coordination normal.  Antalgic gait without assistance  Skin: Skin is warm. Lesion noted. No erythema.     2-3 cm, rounded superficial wound pretibial area of LLE (above skin graft scar). Wound is clean, no surrounded erythema or edema, no necrotic tissue, and + granulation tissue.   Psychiatric: His speech is normal. His mood appears anxious. His affect is blunt.  Poor groomed, poor eye contact, upset   Foot exam 11/30/15   ASSESSMENT AND PLAN:     Beth was seen today for 3 month follow up.  Diagnoses and all orders for this visit:    Uncontrolled type 2 diabetes mellitus with diabetic neuropathy, without long-term current use of insulin ([place])  HgA1C pending today. Will wait until result is back before sending Glipizide to pharmacy again.  Regular exercise as tolerated and healthful diet with avoidance of added sugar food intake is an important part of treatment and recommended. Annual eye exam, periodic dental and foot care recommended. F/U in  4 months  -     HgB A1c -     Comprehensive metabolic panel  Essential hypertension, benign  Adequately controlled. No changes in current management. DASH diet recommended. Eye exam recommended annually. F/U in 6 months, before if needed.  -     Comprehensive metabolic panel -     losartan (COZAAR) 25 MG tablet; Take 1 tablet (25 mg total) by mouth daily.  PVD (peripheral vascular disease) ([place])  Strongly recommend smoking cessation. Continue statin and Aspirin.  Insomnia, unspecified type  He does not want to stop Amitriptyline 150 mg because sometimes it helps. Some side effects discussed, usually is not recommended at his age , I explained. Gabapentin at bedtime may help. Good sleep hygiene. F/U in 3-4 months.  -     gabapentin (NEURONTIN) 300 MG capsule; Take 1 capsule (300 mg total) by mouth at bedtime. Increase to 2 caps in a week if needed and as tolerated.  Chronic pain disorder   Radicular pain, he agrees with trying Gabapentin, some side effects discussed. Refused pain management. I explained I will not take over his pain management. He is open about marijuana use and seems like he knows where to get Oxycodone on the streets + Hx of cocaine use; so I do not feel comfortable prescribing Percocet.   -     gabapentin (NEURONTIN) 300 MG capsule; Take 1 capsule (300 mg total) by mouth at bedtime. Increase to 2 caps in a week if needed and as tolerated.   Wound of left lower extremity, subsequent encounter  Hx he provides suggest greatly improvement but still c/o severe pain, examination today with no signs of infection and lesions seems to be healing well. Continue following with wound clinic. Instructed about warning signs. Good DM controlled and smoking cessation will help with healing process.    Refused flu vaccine.       -Mr. Jimie Kuwahara Desouza was advised to return sooner than planned today if new concerns arise.       Betty G. Martinique,  MD  [facility]. [place] office.

## 2016-03-07 LAB — COMPREHENSIVE METABOLIC PANEL
ALBUMIN: 4.4 g/dL (ref 3.5–5.2)
ALT: 18 U/L (ref 0–53)
AST: 14 U/L (ref 0–37)
Alkaline Phosphatase: 108 U/L (ref 39–117)
BILIRUBIN TOTAL: 0.4 mg/dL (ref 0.2–1.2)
BUN: 15 mg/dL (ref 6–23)
CALCIUM: 9.1 mg/dL (ref 8.4–10.5)
CO2: 27 mEq/L (ref 19–32)
CREATININE: 0.87 mg/dL (ref 0.40–1.50)
Chloride: 105 mEq/L (ref 96–112)
GFR: 92.84 mL/min (ref >60.00)
Glucose, Bld: 124 mg/dL — ABNORMAL HIGH (ref 70–99)
Potassium: 4 mEq/L (ref 3.5–5.1)
Sodium: 139 mEq/L (ref 135–145)
Total Protein: 6.5 g/dL (ref 6.0–8.3)

## 2016-03-07 LAB — HEMOGLOBIN A1C: Hgb A1c MFr Bld: 8.3 % — ABNORMAL HIGH (ref 4.6–6.5)

## 2016-03-13 ENCOUNTER — Other Ambulatory Visit: Payer: Self-pay | Admitting: Internal Medicine

## 2016-03-13 ENCOUNTER — Ambulatory Visit (HOSPITAL_COMMUNITY)
Admission: RE | Admit: 2016-03-13 | Discharge: 2016-03-13 | Disposition: A | Discharge status: Home / Self Care | Payer: Medicare Other | Source: Ambulatory Visit | Attending: Internal Medicine | Admitting: Internal Medicine

## 2016-03-13 ENCOUNTER — Ambulatory Visit (HOSPITAL_COMMUNITY)
Admission: RE | Admit: 2016-03-13 | Discharge: 2016-03-13 | Disposition: A | Discharge status: Home / Self Care | Payer: Medicare Other | Source: Ambulatory Visit | Attending: Cardiovascular Disease | Admitting: Cardiovascular Disease

## 2016-03-13 DIAGNOSIS — E11622 Type 2 diabetes mellitus with other skin ulcer: Secondary | ICD-10-CM | POA: Insufficient documentation

## 2016-03-13 DIAGNOSIS — L97221 Non-pressure chronic ulcer of left calf limited to breakdown of skin: Secondary | ICD-10-CM | POA: Insufficient documentation

## 2016-03-13 DIAGNOSIS — M79605 Pain in left leg: Secondary | ICD-10-CM | POA: Insufficient documentation

## 2016-03-13 DIAGNOSIS — I739 Peripheral vascular disease, unspecified: Secondary | ICD-10-CM | POA: Diagnosis not present

## 2016-03-13 DIAGNOSIS — L97929 Non-pressure chronic ulcer of unspecified part of left lower leg with unspecified severity: Secondary | ICD-10-CM

## 2016-03-20 DIAGNOSIS — E11622 Type 2 diabetes mellitus with other skin ulcer: Secondary | ICD-10-CM | POA: Diagnosis not present

## 2016-03-27 ENCOUNTER — Encounter (HOSPITAL_BASED_OUTPATIENT_CLINIC_OR_DEPARTMENT_OTHER): Payer: Medicare Other | Attending: Internal Medicine

## 2016-03-27 DIAGNOSIS — L97822 Non-pressure chronic ulcer of other part of left lower leg with fat layer exposed: Secondary | ICD-10-CM | POA: Diagnosis not present

## 2016-03-27 DIAGNOSIS — I10 Essential (primary) hypertension: Secondary | ICD-10-CM | POA: Diagnosis not present

## 2016-03-27 DIAGNOSIS — E11622 Type 2 diabetes mellitus with other skin ulcer: Secondary | ICD-10-CM | POA: Diagnosis not present

## 2016-03-27 DIAGNOSIS — F17200 Nicotine dependence, unspecified, uncomplicated: Secondary | ICD-10-CM | POA: Insufficient documentation

## 2016-04-03 DIAGNOSIS — E11622 Type 2 diabetes mellitus with other skin ulcer: Secondary | ICD-10-CM | POA: Diagnosis not present

## 2016-04-09 ENCOUNTER — Other Ambulatory Visit: Payer: Self-pay | Admitting: Family Medicine

## 2016-04-09 NOTE — Telephone Encounter (Signed)
° ° °  Pt said he need diabetic supplies and Dr Martinique would know what he need

## 2016-04-10 DIAGNOSIS — E11622 Type 2 diabetes mellitus with other skin ulcer: Secondary | ICD-10-CM | POA: Diagnosis not present

## 2016-04-10 NOTE — Telephone Encounter (Signed)
Can you please advise on this

## 2016-04-11 MED ORDER — GLUCOSE BLOOD VI STRP
ORAL_STRIP | 3 refills | Status: DC
Start: 2016-04-11 — End: 2017-01-08

## 2016-04-11 MED ORDER — ACCU-CHEK SOFTCLIX LANCET DEV MISC: 3 refills | Status: DC

## 2016-04-11 NOTE — Telephone Encounter (Signed)
Spoke to patient in regards to a request for the diabetic supplies he was requesting and he stated that he was low on the lancets and tests strips.

## 2016-04-11 NOTE — Telephone Encounter (Signed)
Called and spoke with pharmacy. Patient uses Accu-Chek Aviva plus. Sent strips & lancets in for patient.

## 2016-04-11 NOTE — Telephone Encounter (Signed)
Notified Sarah of the patients request.

## 2016-04-11 NOTE — Telephone Encounter (Signed)
Actually I am not sure which medication he needs. He is on several medications. Can you please get specifics from pt, explain that next time it would be faster if he can provide request details.  Thanks, BJ

## 2016-04-29 ENCOUNTER — Other Ambulatory Visit: Payer: Self-pay | Admitting: Family Medicine

## 2016-05-07 ENCOUNTER — Ambulatory Visit (INDEPENDENT_AMBULATORY_CARE_PROVIDER_SITE_OTHER): Payer: Medicare Other | Admitting: Cardiovascular Disease

## 2016-05-07 ENCOUNTER — Encounter: Payer: Self-pay | Admitting: Cardiovascular Disease

## 2016-05-07 VITALS — BP 132/70 | HR 80 | Ht 70.0 in | Wt 160.0 lb

## 2016-05-07 DIAGNOSIS — I739 Peripheral vascular disease, unspecified: Secondary | ICD-10-CM | POA: Diagnosis not present

## 2016-05-07 DIAGNOSIS — E782 Mixed hyperlipidemia: Secondary | ICD-10-CM

## 2016-05-07 DIAGNOSIS — I10 Essential (primary) hypertension: Secondary | ICD-10-CM | POA: Diagnosis not present

## 2016-05-07 DIAGNOSIS — Z9889 Other specified postprocedural states: Secondary | ICD-10-CM

## 2016-05-07 DIAGNOSIS — F17200 Nicotine dependence, unspecified, uncomplicated: Secondary | ICD-10-CM

## 2016-05-07 DIAGNOSIS — I779 Disorder of arteries and arterioles, unspecified: Secondary | ICD-10-CM | POA: Diagnosis not present

## 2016-05-07 NOTE — Progress Notes (Signed)
05/07/2016 Ryan Butler   11-Feb-1951  [id number]  Primary Physician Betty Martinique, MD Primary Cardiologist: Lorretta Harp MD Renae Gloss  HPI:  Ryan Butler is a 65 year old thin appearing of divorced Caucasian male father of 2, grandfather and 2 grandchildren is accompanied by his daughter Lenna Sciara today. He was referred by Dr. Dellia Nims for peripheral vascular evaluation because of a slowly healing left calf ulcer brought to be related to a brown recluse spider bite. His cardiovascular risk factor profile is notable for over 50 pack years of tobacco abuse currently smoking one pack per day and recalcitrant risk factor modification. He is a family history of heart disease with a mother who had bypass surgery in her 53s. He has treated hypertension, diabetes and hyperlipidemia. He has never had a heart attack but has had TIA in the past. He said left carotid endarterectomy which has not been followed up for multiple years. He denies chest pain or shortness of breath. Dopplers performed in our office 03/13/16 revealed normal ABIs bilaterally without significant obstructive disease.   Current Outpatient Prescriptions  Medication Sig Dispense Refill  . amitriptyline (ELAVIL) 150 MG tablet Take 1 tablet (150 mg total) by mouth at bedtime as needed for sleep. 90 tablet 0  . amLODipine (NORVASC) 5 MG tablet Take 1 tablet (5 mg total) by mouth daily. 90 tablet 2  . aspirin (ASPIRIN EC) 81 MG EC tablet Take 81 mg by mouth daily. Swallow whole.    Marland Kitchen atorvastatin (LIPITOR) 20 MG tablet TAKE ONE TABLET BY MOUTH ONCE DAILY WITH  SUPPER 90 tablet 1  . glipiZIDE (GLUCOTROL) 5 MG tablet Take 1 tablet (5 mg total) by mouth daily before breakfast. 60 tablet 3  . glucose blood (ACCU-CHEK AVIVA) test strip Use to test two times daily. (accu-chek aviva plus) 100 each 3  . Lancet Devices (ACCU-CHEK SOFTCLIX) lancets Use to test twice daily. (please use accu-chek) 1 each 3  . losartan (COZAAR) 25 MG  tablet Take 1 tablet (25 mg total) by mouth daily. 90 tablet 2   No current facility-administered medications for this visit.     Allergies  Allergen Reactions  . Hydrocodone Nausea Only    Social History   Social History  . Marital status: Divorced    Spouse name: N/A  . Number of children: N/A  . Years of education: N/A   Occupational History  . Not on file.   Social History Main Topics  . Smoking status: Current Every Day Smoker    Packs/day: 2.00    Years: 55.00    Types: Cigarettes  . Smokeless tobacco: Never Used     Comment: smokes 1/2 pack per day now; reports that he starting to quick  . Alcohol use Yes     Comment: drinks occasional reports former drinker  . Drug use:     Types: Marijuana, Oxycodone  . Sexual activity: Not on file   Other Topics Concern  . Not on file   Social History Narrative  . No narrative on file     Review of Systems: General: negative for chills, fever, night sweats or weight changes.  Cardiovascular: negative for chest pain, dyspnea on exertion, edema, orthopnea, palpitations, paroxysmal nocturnal dyspnea or shortness of breath Dermatological: negative for rash Respiratory: negative for cough or wheezing Urologic: negative for hematuria Abdominal: negative for nausea, vomiting, diarrhea, bright red blood per rectum, melena, or hematemesis Neurologic: negative for visual changes, syncope, or dizziness All other systems  reviewed and are otherwise negative except as noted above.    Blood pressure 132/70, pulse 80, height $RemoveBe'5\' 10"'[id number]$  (1.778 m), weight 160 lb (72.6 kg).  General appearance: alert and no distress Neck: no adenopathy, no carotid bruit, no JVD, supple, symmetrical, trachea midline and thyroid not enlarged, symmetric, no tenderness/mass/nodules Lungs: clear to auscultation bilaterally Heart: regular rate and rhythm, S1, S2 normal, no murmur, click, rub or gallop Extremities: extremities normal, atraumatic, no cyanosis or  edema  EKG sinus rhythm at 80 without ST or T-wave changes. I personally reviewed this EKG  ASSESSMENT AND PLAN:   PVD (peripheral vascular disease) [facility]) Mr. Arvie was referred by Dr. Dellia Nims for evaluation of peripheral vascular disease. He apparently was bitten by a brown recluse spider 16 weeks ago and has a slowly healing wound on his left lower extremity in the pretibial area. He had Doppler studies performed our office that revealed normal ABIs bilaterally without significant obstructive disease. I do not think he's got a vascular component to his slow healing process.  Mixed hyperlipidemia History of hyperlipidemia currently not on statin therapy although this was prescribed to him by his PCP (atorvastatin 20 mg) with a recent lipid profile performed 11/30/15 revealed total cholesterol 221, LDL 158. HDL 43. I suggested that we recheck a lipid profile 3 months after initiating statin therapy.  TOBACCO ABUSE History of ongoing tobacco abuse of one pack per day for the last 51 years recalcitrant risk factor modification.  Essential hypertension, benign History of hypertension blood pressure measured today at 132/70. He is on losartan and amlodipine. Continue current meds at current dose  CAROTID ENDARTERECTOMY, LEFT, HX OF History of left carotid endarterectomy remotely. He has not had duplex ultrasound follow-up surveillance in over 5 years. I am going to recheck carotid Doppler studies.      Lorretta Harp MD FACP,FACC,FAHA, [facility] 05/07/2016 10:16 AM

## 2016-05-07 NOTE — Patient Instructions (Signed)
Medication Instructions: Your physician recommends that you continue on your current medications as directed. Please refer to the Current Medication list given to you today.   Testing/Procedures: Your physician has requested that you have a carotid duplex. This test is an ultrasound of the carotid arteries in your neck. It looks at blood flow through these arteries that supply the brain with blood. Allow one hour for this exam. There are no restrictions or special instructions.  Follow-Up: Your physician recommends that you follow-up as needed with Dr. Gwenlyn Found.  If you need a refill on your cardiac medications before your next appointment, please call your pharmacy.

## 2016-05-07 NOTE — Assessment & Plan Note (Signed)
Ryan Butler was referred by Dr. Dellia Nims for evaluation of peripheral vascular disease. He apparently was bitten by a brown recluse spider 16 weeks ago and has a slowly healing wound on his left lower extremity in the pretibial area. He had Doppler studies performed our office that revealed normal ABIs bilaterally without significant obstructive disease. I do not think he's got a vascular component to his slow healing process.

## 2016-05-07 NOTE — Assessment & Plan Note (Signed)
History of hyperlipidemia currently not on statin therapy although this was prescribed to him by his PCP (atorvastatin 20 mg) with a recent lipid profile performed 11/30/15 revealed total cholesterol 221, LDL 158. HDL 43. I suggested that we recheck a lipid profile 3 months after initiating statin therapy.

## 2016-05-07 NOTE — Assessment & Plan Note (Signed)
History of left carotid endarterectomy remotely. He has not had duplex ultrasound follow-up surveillance in over 5 years. I am going to recheck carotid Doppler studies.

## 2016-05-07 NOTE — Assessment & Plan Note (Signed)
History of ongoing tobacco abuse of one pack per day for the last 51 years recalcitrant risk factor modification.

## 2016-05-07 NOTE — Assessment & Plan Note (Signed)
History of hypertension blood pressure measured today at 132/70. He is on losartan and amlodipine. Continue current meds at current dose

## 2016-05-30 ENCOUNTER — Ambulatory Visit (HOSPITAL_COMMUNITY)
Admission: RE | Admit: 2016-05-30 | Discharge: 2016-05-30 | Disposition: A | Discharge status: Home / Self Care | Payer: Medicare Other | Source: Ambulatory Visit | Attending: Internal Medicine | Admitting: Internal Medicine

## 2016-05-30 DIAGNOSIS — I779 Disorder of arteries and arterioles, unspecified: Secondary | ICD-10-CM | POA: Diagnosis not present

## 2016-05-30 DIAGNOSIS — I739 Peripheral vascular disease, unspecified: Secondary | ICD-10-CM

## 2016-06-03 ENCOUNTER — Other Ambulatory Visit: Payer: Self-pay | Admitting: Cardiovascular Disease

## 2016-06-03 DIAGNOSIS — I739 Peripheral vascular disease, unspecified: Principal | ICD-10-CM

## 2016-06-03 DIAGNOSIS — I779 Disorder of arteries and arterioles, unspecified: Secondary | ICD-10-CM

## 2016-09-04 ENCOUNTER — Other Ambulatory Visit: Payer: Self-pay

## 2016-09-04 MED ORDER — ATORVASTATIN CALCIUM 20 MG PO TABS: ORAL_TABLET | ORAL | 0 refills | Status: DC

## 2016-09-04 MED ORDER — AMITRIPTYLINE HCL 150 MG PO TABS: 150.0000 mg | ORAL_TABLET | Freq: Every evening | ORAL | 0 refills | Status: DC | PRN

## 2016-11-07 ENCOUNTER — Other Ambulatory Visit: Payer: Self-pay | Admitting: Family Medicine

## 2016-12-22 ENCOUNTER — Other Ambulatory Visit: Payer: Self-pay

## 2016-12-22 MED ORDER — ATORVASTATIN CALCIUM 20 MG PO TABS: ORAL_TABLET | ORAL | 0 refills | Status: DC

## 2016-12-24 ENCOUNTER — Other Ambulatory Visit: Payer: Self-pay

## 2016-12-24 MED ORDER — ATORVASTATIN CALCIUM 20 MG PO TABS: ORAL_TABLET | ORAL | 0 refills | Status: DC

## 2017-01-06 ENCOUNTER — Ambulatory Visit: Payer: Medicare Other | Admitting: Internal Medicine

## 2017-01-06 ENCOUNTER — Encounter: Payer: Self-pay | Admitting: Family Medicine

## 2017-01-06 ENCOUNTER — Ambulatory Visit (INDEPENDENT_AMBULATORY_CARE_PROVIDER_SITE_OTHER): Payer: Medicare Other | Admitting: Family Medicine

## 2017-01-06 ENCOUNTER — Other Ambulatory Visit: Payer: Self-pay | Admitting: Family Medicine

## 2017-01-06 VITALS — BP 122/80 | HR 58 | Resp 12 | Ht 70.0 in | Wt 163.1 lb

## 2017-01-06 DIAGNOSIS — R51 Headache: Secondary | ICD-10-CM

## 2017-01-06 DIAGNOSIS — E1140 Type 2 diabetes mellitus with diabetic neuropathy, unspecified: Secondary | ICD-10-CM

## 2017-01-06 DIAGNOSIS — IMO0002 Reserved for concepts with insufficient information to code with codable children: Secondary | ICD-10-CM

## 2017-01-06 DIAGNOSIS — E1165 Type 2 diabetes mellitus with hyperglycemia: Secondary | ICD-10-CM

## 2017-01-06 DIAGNOSIS — R519 Headache, unspecified: Secondary | ICD-10-CM

## 2017-01-06 DIAGNOSIS — I499 Cardiac arrhythmia, unspecified: Secondary | ICD-10-CM

## 2017-01-06 DIAGNOSIS — I739 Peripheral vascular disease, unspecified: Secondary | ICD-10-CM

## 2017-01-06 DIAGNOSIS — R001 Bradycardia, unspecified: Secondary | ICD-10-CM

## 2017-01-06 DIAGNOSIS — I10 Essential (primary) hypertension: Secondary | ICD-10-CM

## 2017-01-06 DIAGNOSIS — R29898 Other symptoms and signs involving the musculoskeletal system: Secondary | ICD-10-CM

## 2017-01-06 DIAGNOSIS — G4700 Insomnia, unspecified: Secondary | ICD-10-CM

## 2017-01-06 DIAGNOSIS — G894 Chronic pain syndrome: Secondary | ICD-10-CM | POA: Diagnosis not present

## 2017-01-06 NOTE — Progress Notes (Signed)
ACUTE VISIT   HPI:  Chief Complaint  Patient presents with  . left side numbness    Ryan Butler is a 66 y.o. male, who is here today with his son complaining of 2 days of left UE weakness and numbness. States that he feels like his left arm was "dead."    Hx of DM II,HTN,PVD, and chronic pain among some. He has not been here in the office since 02/2016.  Lab Results  Component Value Date   CREATININE 0.87 03/06/2016   BUN 15 03/06/2016   NA 139 03/06/2016   K 4.0 03/06/2016   CL 105 03/06/2016   CO2 27 03/06/2016    Neurologic Problem  The patient's primary symptoms include focal sensory loss, focal weakness, slurred speech, a visual change and weakness. The patient's pertinent negatives include no altered mental status, clumsiness, loss of balance, memory loss, near-syncope or syncope. This is a recurrent problem. The current episode started in the past 7 days. The neurological problem developed gradually. The problem has been gradually improving since onset. There was left-sided and upper extremity focality noted. Associated symptoms include back pain, fatigue, headaches and neck pain. Pertinent negatives include no abdominal pain, aura, bladder incontinence, bowel incontinence, confusion, diaphoresis, dizziness, fever, nausea, palpitations, shortness of breath or vomiting. Past treatments include nothing.   According to his son, he has had these symptoms for "a while" but not witnessed before, this time family members noted the focal weakness. Parietal and temporal headache, which he describes as "slight" and "once in a while. Headache is intermittent,throbbing, 8/10 for the past 3 weeks. Associated intermittent blurry vision and slurred speech 2 days ago. He denies MS changes, nausea, or vomiting.  He has difficulty walking, attributed to chronic left hip pain chronic.  He denies numbness or tingling on lower extremities or face.  When asked about chest pain  he states that he has had chest "gas pain" for "a while" and is stable. He denies palpitations, dyspnea, or diaphoresis.  + Smoker. PAD, he follows with Dr Gwenlyn Found, last seen in 04/2016. He has not been taking aspirin for the past 2-3 days. He is on Lipitor 20 mg daily.  Hip pain left, exacerbated by walking.  Hx of cervical radicular pain with upper back burning sensation, reported as stable for years. Generalized OA.  He has not been able to sleep in about 3 days because pain when he changes positions in bed.  DM II, currently on Glipizide 5 mg daily.  Lab Results  Component Value Date   HGBA1C 8.3 (H) 03/06/2016    HTN: Currently he is on Amlodipine 5 mg daily and Losartan 25 mg daily. He denies orthopnea or PND.  He tells me he has been taking his medications but last reveals in October 2017 (#90/2).  Today he is also requesting refills on all his medications. He has history of insomnia, he is currently on Amitriptyline 150 mg daily.    Review of Systems  Constitutional: Positive for fatigue. Negative for appetite change, diaphoresis and fever.  HENT: Negative for mouth sores, nosebleeds, sore throat and trouble swallowing.   Eyes: Positive for visual disturbance. Negative for photophobia, pain and redness.  Respiratory: Negative for cough, shortness of breath and wheezing.   Cardiovascular: Negative for palpitations, leg swelling and near-syncope.  Gastrointestinal: Negative for abdominal pain, bowel incontinence, nausea and vomiting.       No changes in bowel habits.  Endocrine: Negative for cold intolerance, heat  intolerance, polydipsia, polyphagia and polyuria.  Genitourinary: Negative for bladder incontinence, decreased urine volume, dysuria and hematuria.  Musculoskeletal: Positive for arthralgias, back pain, gait problem and neck pain.  Skin: Negative for pallor and rash.  Neurological: Positive for focal weakness, speech difficulty, weakness, numbness and  headaches. Negative for dizziness, seizures, syncope, facial asymmetry and loss of balance.  Psychiatric/Behavioral: Positive for sleep disturbance. Negative for confusion, hallucinations and memory loss. The patient is nervous/anxious.      Current Outpatient Prescriptions on File Prior to Visit  Medication Sig Dispense Refill  . amitriptyline (ELAVIL) 150 MG tablet Take 1 tablet (150 mg total) by mouth at bedtime as needed for sleep. 90 tablet 0  . amLODipine (NORVASC) 5 MG tablet Take 1 tablet (5 mg total) by mouth daily. 90 tablet 2  . aspirin (ASPIRIN EC) 81 MG EC tablet Take 81 mg by mouth daily. Swallow whole.    Marland Kitchen atorvastatin (LIPITOR) 20 MG tablet TAKE ONE TABLET BY MOUTH ONCE DAILY WITH  SUPPER 90 tablet 0  . glipiZIDE (GLUCOTROL) 5 MG tablet Take 1 tablet (5 mg total) by mouth daily before breakfast. 60 tablet 3  . glucose blood (ACCU-CHEK AVIVA) test strip Use to test two times daily. (accu-chek aviva plus) 100 each 3  . [employer] (ACCU-CHEK SOFTCLIX) lancets Use to test twice daily. (please use accu-chek) 1 each 3  . losartan (COZAAR) 25 MG tablet Take 1 tablet (25 mg total) by mouth daily. 90 tablet 2   No current facility-administered medications on file prior to visit.      Past Medical History:  Diagnosis Date  . Carotid artery disease ([place])   . Cervical spondylosis   . Closed head injury 1996  . Essential hypertension, benign   . History of colonic polyps   . History of scarlet fever   . History of seizures   . History of TIAs   . Polysubstance abuse    History of cocaine in his 52's  . Type 2 diabetes mellitus ([place])    Pt stopped metformin 09/2013   Allergies  Allergen Reactions  . Hydrocodone Nausea Only    Social History   Social History  . Marital status: Divorced    Spouse name: N/A  . Number of children: N/A  . Years of education: N/A   Social History Main Topics  . Smoking status: Current Every Day Smoker    Packs/day: 2.00    Years:  55.00    Types: Cigarettes  . Smokeless tobacco: Never Used     Comment: smokes 1/2 pack per day now; reports that he starting to quick  . Alcohol use Yes     Comment: drinks occasional reports former drinker  . Drug use: Yes    Types: Marijuana, Oxycodone  . Sexual activity: Not Asked   Other Topics Concern  . None   Social History Narrative  . None    Vitals:   01/06/17 1527  BP: 122/80  Pulse: (!) 58  Resp: 12  SpO2: 98%   Body mass index is 23.41 kg/m.  Physical Exam  Nursing note and vitals reviewed. Constitutional: He is oriented to person, place, and time. He appears well-developed. He appears distressed (due to pain).  HENT:  Head: Normocephalic and atraumatic.  Mouth/Throat: Oropharynx is clear and moist. Mucous membranes are dry. Abnormal dentition.  Eyes: Pupils are equal, round, and reactive to light. Conjunctivae and EOM are normal.  Neck: Carotid bruit is present.  Cardiovascular: An irregular rhythm  present.  Occasional extrasystoles are present. Bradycardia present.   No murmur heard. Pulses:      Dorsalis pedis pulses are 2+ on the right side, and 2+ on the left side.  Respiratory: Effort normal and breath sounds normal. No respiratory distress.  GI: Soft. He exhibits no mass. There is no hepatomegaly. There is no tenderness.  Musculoskeletal: He exhibits edema (Trace pitting LE edema,bilateral).       Cervical back: He exhibits decreased range of motion and tenderness. He exhibits no bony tenderness.  Limitation shoulder ROM due to pain.   Lymphadenopathy:    He has no cervical adenopathy.  Neurological: He is alert and oriented to person, place, and time. He is not disoriented. He displays no tremor. No cranial nerve deficit.  Grip left hand 4/5, rest strength 5/5. Left bicep DTR's 2+, right 1+ Knee DTR's symmetric 1+ Antalgic gait but stable with no assistance needed.  Skin: Skin is warm. No rash noted. No erythema.  Psychiatric: His mood  appears anxious.  Poor groomed, poor eye contact.     ASSESSMENT AND PLAN:   Mr. Merrel was seen today for left side numbness.  Diagnoses and all orders for this visit:  Weakness of left upper extremity  ? TIA, radicular pain among some. Stat MRI was arranged. Clearly instructed about warning signs.  -     MR Brain W Wo Contrast; Future   Sinus bradycardia  New finding. I heard a few extra beats but not capture on EKG. Currently he is asymptomatic. EKG: sinus bradycardia,  He is not clear about chest pain/discomfort, "gas pain". Explained this could be an indication of CAD among others. I recommend following with Dr Gwenlyn Found. He was clearly instructed about warning signs. No changes in his antihypertensive meds for now.  -     EKG 12-Lead -     CBC with Differential/Platelet  Uncontrolled type 2 diabetes mellitus with diabetic neuropathy, without long-term current use of insulin ([place])  He has not been compliant with follow-up appointments. Further recommendations would be given according to lab results.  -     Basic metabolic panel -     Hemoglobin A1c -     Fructosamine  Essential hypertension, benign  BP is adequate. Low salt diet recommended. No changes in current management.  -     CBC with Differential/Platelet  Headache, unspecified headache type  Reported as new for the past 3 weeks, headache is listed on his problem list. ? Tension headache. Given his other associated symptoms, stat brain MRI was arranged.  PVD (peripheral vascular disease) ([place])  Strongly encouraged smoking cessation. Continue Lipitor 20 mg daily. Continue aspirin 81 mg daily.  Chronic pain disorder  Some of his symptoms and clinical findings today, related to chronic pain. He has history of cervical radiculopathy, which currently causing left upper extremity paresthesias. We could consider Gabapentin in the future. He has been reluctant to try other medications in the  past.  Insomnia, unspecified type  Aggravated by pain. He is currently on Amitriptyline 150 mg daily, some side effects of this medication were discussed. It does not seem to be helping with cervical radiculopathy. I am considering weaning no medication and trying Gabapentin.  Visit from 3:50 pm-4:53 pm. > 40 min face to face OV. > 50% was dedicated to discussion of Dx, prognosis, coordination of care, and some side effects of medications. He is a poor historian and had difficulty obtaining details about all his symptoms, ? Chronic vs  acute.    -RyanTalik Casique Thomure was advised to seek immediate medical attention if sudden worsening symptoms.     Skeeter Sheard G. [place], MD  [facility]. [place] office.

## 2017-01-06 NOTE — Patient Instructions (Signed)
A few things to remember from today's visit:   Irregular heart rate - Plan: EKG 12-Lead, CBC with Differential/Platelet  Uncontrolled type 2 diabetes mellitus with diabetic neuropathy, without long-term current use of insulin ([place]) - Plan: Basic metabolic panel, Hemoglobin A1c, Fructosamine  Essential hypertension, benign - Plan: CBC with Differential/Platelet  PVD (peripheral vascular disease) ([facility])  Weakness of left upper extremity - Plan: MR Brain W Wo Contrast     Please be sure medication list is accurate. If a new problem present, please set up appointment sooner than planned today.

## 2017-01-07 ENCOUNTER — Inpatient Hospital Stay (HOSPITAL_COMMUNITY): Payer: Medicare Other

## 2017-01-07 ENCOUNTER — Other Ambulatory Visit: Payer: Self-pay | Admitting: Family Medicine

## 2017-01-07 ENCOUNTER — Encounter (HOSPITAL_COMMUNITY): Payer: Self-pay

## 2017-01-07 ENCOUNTER — Other Ambulatory Visit: Payer: Medicare Other

## 2017-01-07 ENCOUNTER — Other Ambulatory Visit: Payer: Self-pay

## 2017-01-07 ENCOUNTER — Emergency Department (HOSPITAL_COMMUNITY): Payer: Medicare Other

## 2017-01-07 ENCOUNTER — Ambulatory Visit (HOSPITAL_COMMUNITY)
Admission: RE | Admit: 2017-01-07 | Discharge: 2017-01-07 | Disposition: A | Discharge status: Home / Self Care | Payer: Medicare Other | Source: Ambulatory Visit | Attending: Family Medicine | Admitting: Family Medicine

## 2017-01-07 ENCOUNTER — Inpatient Hospital Stay (HOSPITAL_COMMUNITY)
Admission: EM | Admit: 2017-01-07 | Discharge: 2017-01-08 | DRG: 065 | Disposition: A | Discharge status: Home / Self Care | Payer: Medicare Other | Attending: Internal Medicine | Admitting: Internal Medicine

## 2017-01-07 DIAGNOSIS — I639 Cerebral infarction, unspecified: Secondary | ICD-10-CM | POA: Diagnosis present

## 2017-01-07 DIAGNOSIS — G8194 Hemiplegia, unspecified affecting left nondominant side: Secondary | ICD-10-CM | POA: Diagnosis present

## 2017-01-07 DIAGNOSIS — M25511 Pain in right shoulder: Secondary | ICD-10-CM | POA: Diagnosis present

## 2017-01-07 DIAGNOSIS — I6340 Cerebral infarction due to embolism of unspecified cerebral artery: Principal | ICD-10-CM | POA: Diagnosis present

## 2017-01-07 DIAGNOSIS — R079 Chest pain, unspecified: Secondary | ICD-10-CM | POA: Diagnosis not present

## 2017-01-07 DIAGNOSIS — G4700 Insomnia, unspecified: Secondary | ICD-10-CM | POA: Diagnosis present

## 2017-01-07 DIAGNOSIS — IMO0002 Reserved for concepts with insufficient information to code with codable children: Secondary | ICD-10-CM | POA: Diagnosis present

## 2017-01-07 DIAGNOSIS — I63012 Cerebral infarction due to thrombosis of left vertebral artery: Secondary | ICD-10-CM | POA: Diagnosis not present

## 2017-01-07 DIAGNOSIS — Z7984 Long term (current) use of oral hypoglycemic drugs: Secondary | ICD-10-CM | POA: Diagnosis not present

## 2017-01-07 DIAGNOSIS — I10 Essential (primary) hypertension: Secondary | ICD-10-CM

## 2017-01-07 DIAGNOSIS — M25551 Pain in right hip: Secondary | ICD-10-CM | POA: Diagnosis not present

## 2017-01-07 DIAGNOSIS — I69322 Dysarthria following cerebral infarction: Secondary | ICD-10-CM | POA: Diagnosis not present

## 2017-01-07 DIAGNOSIS — Z885 Allergy status to narcotic agent status: Secondary | ICD-10-CM

## 2017-01-07 DIAGNOSIS — Z9889 Other specified postprocedural states: Secondary | ICD-10-CM | POA: Diagnosis not present

## 2017-01-07 DIAGNOSIS — E1149 Type 2 diabetes mellitus with other diabetic neurological complication: Secondary | ICD-10-CM | POA: Diagnosis present

## 2017-01-07 DIAGNOSIS — M25512 Pain in left shoulder: Secondary | ICD-10-CM | POA: Diagnosis not present

## 2017-01-07 DIAGNOSIS — I672 Cerebral atherosclerosis: Secondary | ICD-10-CM | POA: Insufficient documentation

## 2017-01-07 DIAGNOSIS — M1990 Unspecified osteoarthritis, unspecified site: Secondary | ICD-10-CM | POA: Diagnosis present

## 2017-01-07 DIAGNOSIS — I6300 Cerebral infarction due to thrombosis of unspecified precerebral artery: Secondary | ICD-10-CM

## 2017-01-07 DIAGNOSIS — R29898 Other symptoms and signs involving the musculoskeletal system: Secondary | ICD-10-CM

## 2017-01-07 DIAGNOSIS — G459 Transient cerebral ischemic attack, unspecified: Secondary | ICD-10-CM | POA: Diagnosis present

## 2017-01-07 DIAGNOSIS — F1290 Cannabis use, unspecified, uncomplicated: Secondary | ICD-10-CM | POA: Diagnosis present

## 2017-01-07 DIAGNOSIS — Z79899 Other long term (current) drug therapy: Secondary | ICD-10-CM

## 2017-01-07 DIAGNOSIS — R51 Headache: Principal | ICD-10-CM

## 2017-01-07 DIAGNOSIS — E1165 Type 2 diabetes mellitus with hyperglycemia: Secondary | ICD-10-CM | POA: Diagnosis present

## 2017-01-07 DIAGNOSIS — E1140 Type 2 diabetes mellitus with diabetic neuropathy, unspecified: Secondary | ICD-10-CM

## 2017-01-07 DIAGNOSIS — Z981 Arthrodesis status: Secondary | ICD-10-CM | POA: Diagnosis not present

## 2017-01-07 DIAGNOSIS — G8929 Other chronic pain: Secondary | ICD-10-CM | POA: Diagnosis not present

## 2017-01-07 DIAGNOSIS — M25552 Pain in left hip: Secondary | ICD-10-CM | POA: Diagnosis not present

## 2017-01-07 DIAGNOSIS — I6521 Occlusion and stenosis of right carotid artery: Secondary | ICD-10-CM | POA: Insufficient documentation

## 2017-01-07 DIAGNOSIS — R4781 Slurred speech: Secondary | ICD-10-CM | POA: Diagnosis present

## 2017-01-07 DIAGNOSIS — I63011 Cerebral infarction due to thrombosis of right vertebral artery: Secondary | ICD-10-CM | POA: Diagnosis not present

## 2017-01-07 DIAGNOSIS — R519 Headache, unspecified: Secondary | ICD-10-CM

## 2017-01-07 DIAGNOSIS — E782 Mixed hyperlipidemia: Secondary | ICD-10-CM

## 2017-01-07 DIAGNOSIS — Z7982 Long term (current) use of aspirin: Secondary | ICD-10-CM

## 2017-01-07 DIAGNOSIS — I6523 Occlusion and stenosis of bilateral carotid arteries: Secondary | ICD-10-CM | POA: Diagnosis not present

## 2017-01-07 DIAGNOSIS — F17210 Nicotine dependence, cigarettes, uncomplicated: Secondary | ICD-10-CM | POA: Diagnosis not present

## 2017-01-07 DIAGNOSIS — I69354 Hemiplegia and hemiparesis following cerebral infarction affecting left non-dominant side: Secondary | ICD-10-CM | POA: Diagnosis not present

## 2017-01-07 DIAGNOSIS — T63301D Toxic effect of unspecified spider venom, accidental (unintentional), subsequent encounter: Secondary | ICD-10-CM

## 2017-01-07 LAB — COMPREHENSIVE METABOLIC PANEL
ALK PHOS: 103 U/L (ref 38–126)
ALT: 25 U/L (ref 17–63)
ANION GAP: 8 (ref 5–15)
AST: 19 U/L (ref 15–41)
Albumin: 3.7 g/dL (ref 3.5–5.0)
BUN: 13 mg/dL (ref 6–20)
CALCIUM: 8.9 mg/dL (ref 8.9–10.3)
CO2: 28 mmol/L (ref 22–32)
CREATININE: 0.82 mg/dL (ref 0.61–1.24)
Chloride: 100 mmol/L — ABNORMAL LOW (ref 101–111)
Glucose, Bld: 167 mg/dL — ABNORMAL HIGH (ref 65–99)
Potassium: 3.6 mmol/L (ref 3.5–5.1)
SODIUM: 136 mmol/L (ref 135–145)
TOTAL PROTEIN: 6.2 g/dL — AB (ref 6.5–8.1)
Total Bilirubin: 0.5 mg/dL (ref 0.3–1.2)

## 2017-01-07 LAB — RAPID URINE DRUG SCREEN, HOSP PERFORMED
Amphetamines: NOT DETECTED
BARBITURATES: NOT DETECTED
BENZODIAZEPINES: NOT DETECTED
COCAINE: NOT DETECTED
OPIATES: NOT DETECTED
Tetrahydrocannabinol: POSITIVE — AB

## 2017-01-07 LAB — CBC WITH DIFFERENTIAL/PLATELET
BASOS ABS: 0 10*3/uL (ref 0.0–0.1)
Basophils Relative: 0.7 % (ref 0.0–3.0)
EOS ABS: 0.1 10*3/uL (ref 0.0–0.7)
Eosinophils Relative: 1.4 % (ref 0.0–5.0)
HEMATOCRIT: 38.6 % — AB (ref 39.0–52.0)
Hemoglobin: 12.7 g/dL — ABNORMAL LOW (ref 13.0–17.0)
Lymphocytes Relative: 43.5 % (ref 12.0–46.0)
Lymphs Abs: 2.7 10*3/uL (ref 0.7–4.0)
MCHC: 33 g/dL (ref 30.0–36.0)
MCV: 95.1 fl (ref 78.0–100.0)
MONO ABS: 0.6 10*3/uL (ref 0.1–1.0)
Monocytes Relative: 9.9 % (ref 3.0–12.0)
Neutro Abs: 2.7 10*3/uL (ref 1.4–7.7)
Neutrophils Relative %: 44.5 % (ref 43.0–77.0)
Platelets: 127 10*3/uL — ABNORMAL LOW (ref 150.0–400.0)
RBC: 4.06 Mil/uL — AB (ref 4.22–5.81)
RDW: 13.8 % (ref 11.5–15.5)
WBC: 6.1 10*3/uL (ref 4.0–10.5)

## 2017-01-07 LAB — CBC
HEMATOCRIT: 38.9 % — AB (ref 39.0–52.0)
Hemoglobin: 13.2 g/dL (ref 13.0–17.0)
MCH: 31.4 pg (ref 26.0–34.0)
MCHC: 33.9 g/dL (ref 30.0–36.0)
MCV: 92.6 fL (ref 78.0–100.0)
PLATELETS: 124 10*3/uL — AB (ref 150–400)
RBC: 4.2 MIL/uL — AB (ref 4.22–5.81)
RDW: 13.5 % (ref 11.5–15.5)
WBC: 6.1 10*3/uL (ref 4.0–10.5)

## 2017-01-07 LAB — URINALYSIS, ROUTINE W REFLEX MICROSCOPIC
Bilirubin Urine: NEGATIVE
GLUCOSE, UA: 50 mg/dL — AB
HGB URINE DIPSTICK: NEGATIVE
Ketones, ur: NEGATIVE mg/dL
LEUKOCYTES UA: NEGATIVE
Nitrite: NEGATIVE
PH: 6 (ref 5.0–8.0)
PROTEIN: NEGATIVE mg/dL
SPECIFIC GRAVITY, URINE: 1.006 (ref 1.005–1.030)

## 2017-01-07 LAB — BASIC METABOLIC PANEL
BUN: 16 mg/dL (ref 6–23)
CHLORIDE: 104 meq/L (ref 96–112)
CO2: 29 mEq/L (ref 19–32)
Calcium: 9.6 mg/dL (ref 8.4–10.5)
Creatinine, Ser: 0.8 mg/dL (ref 0.40–1.50)
GFR: 102.03 mL/min (ref >60.00)
Glucose, Bld: 162 mg/dL — ABNORMAL HIGH (ref 70–99)
POTASSIUM: 4.4 meq/L (ref 3.5–5.1)
SODIUM: 141 meq/L (ref 135–145)

## 2017-01-07 LAB — GLUCOSE, CAPILLARY
GLUCOSE-CAPILLARY: 126 mg/dL — AB (ref 65–99)
Glucose-Capillary: 231 mg/dL — ABNORMAL HIGH (ref 65–99)

## 2017-01-07 LAB — PROTIME-INR
INR: 0.99
PROTHROMBIN TIME: 13.1 s (ref 11.4–15.2)

## 2017-01-07 LAB — SEDIMENTATION RATE: Sed Rate: 1 mm/hr (ref 0–20)

## 2017-01-07 LAB — HEMOGLOBIN A1C
HEMOGLOBIN A1C: 7.7 % — AB (ref 4.8–5.6)
Hgb A1c MFr Bld: 8.1 % — ABNORMAL HIGH (ref 4.6–6.5)
MEAN PLASMA GLUCOSE: 174.29 mg/dL

## 2017-01-07 LAB — DIFFERENTIAL
Basophils Absolute: 0 10*3/uL (ref 0.0–0.1)
Basophils Relative: 0 %
EOS PCT: 1 %
Eosinophils Absolute: 0.1 10*3/uL (ref 0.0–0.7)
LYMPHS PCT: 24 %
Lymphs Abs: 1.5 10*3/uL (ref 0.7–4.0)
MONO ABS: 0.5 10*3/uL (ref 0.1–1.0)
MONOS PCT: 8 %
NEUTROS ABS: 4 10*3/uL (ref 1.7–7.7)
Neutrophils Relative %: 67 %

## 2017-01-07 LAB — ETHANOL

## 2017-01-07 LAB — C-REACTIVE PROTEIN: CRP: 0.2 mg/dL — ABNORMAL LOW (ref 0.5–20.0)

## 2017-01-07 LAB — POCT I-STAT CREATININE: Creatinine, Ser: 0.9 mg/dL (ref 0.61–1.24)

## 2017-01-07 LAB — TROPONIN I

## 2017-01-07 LAB — TSH: TSH: 3.83 u[IU]/mL (ref 0.35–4.50)

## 2017-01-07 LAB — APTT: aPTT: 29 seconds (ref 24–36)

## 2017-01-07 MED ORDER — ACETAMINOPHEN 325 MG PO TABS
650.0000 mg | ORAL_TABLET | Freq: Four times a day (QID) | ORAL | Status: DC | PRN
  Administered 2017-01-07: 650 mg via ORAL
  Filled 2017-01-07: qty 2

## 2017-01-07 MED ORDER — ATORVASTATIN CALCIUM 40 MG PO TABS
40.0000 mg | ORAL_TABLET | Freq: Every day | ORAL | Status: DC
  Administered 2017-01-07: 40 mg via ORAL
  Filled 2017-01-07: qty 1

## 2017-01-07 MED ORDER — ENOXAPARIN SODIUM 40 MG/0.4ML ~~LOC~~ SOLN
40.0000 mg | Freq: Every day | SUBCUTANEOUS | Status: DC
  Administered 2017-01-07 – 2017-01-08 (×2): 40 mg via SUBCUTANEOUS
  Filled 2017-01-07 (×2): qty 0.4

## 2017-01-07 MED ORDER — TRAMADOL HCL 50 MG PO TABS: 100.0000 mg | ORAL_TABLET | Freq: Four times a day (QID) | ORAL | Status: DC | PRN

## 2017-01-07 MED ORDER — LEVALBUTEROL HCL 0.63 MG/3ML IN NEBU: 0.6300 mg | INHALATION_SOLUTION | Freq: Four times a day (QID) | RESPIRATORY_TRACT | Status: DC | PRN

## 2017-01-07 MED ORDER — ASPIRIN 325 MG PO TABS
325.0000 mg | ORAL_TABLET | Freq: Every day | ORAL | Status: DC
  Administered 2017-01-07: 325 mg via ORAL
  Filled 2017-01-07: qty 1

## 2017-01-07 MED ORDER — GLIPIZIDE 5 MG PO TABS
5.0000 mg | ORAL_TABLET | Freq: Every day | ORAL | Status: DC
  Administered 2017-01-08: 5 mg via ORAL
  Filled 2017-01-07: qty 1

## 2017-01-07 MED ORDER — AMITRIPTYLINE HCL 25 MG PO TABS
50.0000 mg | ORAL_TABLET | Freq: Every evening | ORAL | Status: DC | PRN
  Administered 2017-01-07: 50 mg via ORAL
  Filled 2017-01-07: qty 2

## 2017-01-07 MED ORDER — ONDANSETRON HCL 4 MG/2ML IJ SOLN
4.0000 mg | Freq: Four times a day (QID) | INTRAMUSCULAR | Status: DC | PRN
  Administered 2017-01-07: 4 mg via INTRAVENOUS
  Filled 2017-01-07: qty 2

## 2017-01-07 MED ORDER — ONDANSETRON HCL 4 MG PO TABS: 4.0000 mg | ORAL_TABLET | Freq: Four times a day (QID) | ORAL | Status: DC | PRN

## 2017-01-07 MED ORDER — STROKE: EARLY STAGES OF RECOVERY BOOK
Freq: Once | Status: AC
  Administered 2017-01-07: 15:00:00
  Filled 2017-01-07: qty 1

## 2017-01-07 MED ORDER — GADOBENATE DIMEGLUMINE 529 MG/ML IV SOLN
15.0000 mL | Freq: Once | INTRAVENOUS | Status: AC | PRN
  Administered 2017-01-07: 15 mL via INTRAVENOUS

## 2017-01-07 MED ORDER — CLOPIDOGREL BISULFATE 75 MG PO TABS
75.0000 mg | ORAL_TABLET | Freq: Every day | ORAL | Status: DC
  Administered 2017-01-08: 75 mg via ORAL
  Filled 2017-01-07: qty 1

## 2017-01-07 MED ORDER — ACETAMINOPHEN 650 MG RE SUPP: 650.0000 mg | Freq: Four times a day (QID) | RECTAL | Status: DC | PRN

## 2017-01-07 MED ORDER — INSULIN ASPART 100 UNIT/ML ~~LOC~~ SOLN
0.0000 [IU] | Freq: Three times a day (TID) | SUBCUTANEOUS | Status: DC
  Administered 2017-01-07: 3 [IU] via SUBCUTANEOUS
  Administered 2017-01-08: 1 [IU] via SUBCUTANEOUS

## 2017-01-07 MED ORDER — TRAMADOL HCL 50 MG PO TABS
50.0000 mg | ORAL_TABLET | Freq: Four times a day (QID) | ORAL | Status: DC | PRN
  Administered 2017-01-07: 50 mg via ORAL
  Filled 2017-01-07: qty 1

## 2017-01-07 MED ORDER — OXYCODONE-ACETAMINOPHEN 5-325 MG PO TABS
1.0000 | ORAL_TABLET | Freq: Once | ORAL | Status: AC
  Administered 2017-01-07: 1 via ORAL
  Filled 2017-01-07: qty 1

## 2017-01-07 NOTE — H&P (Signed)
[facility] History and Physical  Ryan Butler:[id number] DOB: August 25, 1950 DOA: 01/07/2017  Referring physician:  PCP: [place], Betty G, MD   Chief Complaint: CVA   HPI:   66 year old male with a history of diabetes mellitus type 2, hypertension, peripheral vascular disease, seen by PCP yesterday for slurred speech visual changes and weakness, that started 7 days ago. Patient also had left-sided upper and lower extremity weakness. Patient denied any headache, balance issues, nausea. Patient denied any chest pain palpitations or shortness of breath. Patient is an active smoker. He has had difficulty ambulating due to left leg pain. Patient has not taken aspirin in the last 4 days. Patient had MRI done by his PCP that showed Small acute and subacute infarcts in the right cerebral hemisphere, posterior watershed territory. ED course BP (!) 146/68   Pulse (!) 56   Temp (!) 97.5 F (36.4 C)   Resp 16   Ht $R'5\' 10"'Wy$  (1.778 m)   Wt 73.9 kg (163 lb)   SpO2 98%   BMI 23.39 kg/m  Patient admitted to [facility] for evaluation of subacute stroke.    Review of Systems: negative for the following  Constitutional: Positive for fatigue. Negative for appetite change, diaphoresis and fever.  HENT: Negative for mouth sores, nosebleeds, sore throat and trouble swallowing.   Eyes: Positive for visual disturbance. Negative for photophobia, pain and redness.  Respiratory: Negative for cough, shortness of breath and wheezing.   Cardiovascular: Negative for palpitations, leg swelling and near-syncope.  Gastrointestinal: Negative for abdominal pain, bowel incontinence, nausea and vomiting.       No changes in bowel habits.  Endocrine: Negative for cold intolerance, heat intolerance, polydipsia, polyphagia and polyuria.  Genitourinary: Negative for bladder incontinence, decreased urine volume, dysuria and hematuria.  Musculoskeletal: Positive for arthralgias, back pain, gait problem and neck pain.   Skin: Negative for pallor and rash.  Neurological: Positive for focal weakness, speech difficulty, weakness, numbness and headaches. Negative for dizziness, seizures, syncope, facial asymmetry and loss of balance.  Psychiatric/Behavioral: Positive for sleep disturbance. Negative for confusion, hallucinations and memory loss. The patient is nervous/anxious.        Past Medical History:  Diagnosis Date  . Carotid artery disease ([place])   . Cervical spondylosis   . Closed head injury 1996  . Essential hypertension, benign   . History of colonic polyps   . History of scarlet fever   . History of seizures   . History of TIAs   . Polysubstance abuse    History of cocaine in his 60's  . Type 2 diabetes mellitus ([name])    Pt stopped metformin 09/2013     Past Surgical History:  Procedure Laterality Date  . ANTERIOR CERVICAL DECOMP/DISCECTOMY FUSION N/A 12/05/2013   Procedure: ANTERIOR CERVICAL DECOMPRESSION/DISCECTOMY FUSION 2 LEVELS;  Surgeon: Elaina Hoops, MD;  Location: [place] [facility];  Service: Neurosurgery;  Laterality: N/A;  Anterior Cervical Discectomy and Fusion with PEEK Cages and allograft Cervical Four-Five/Five-Six  . FRACTURE SURGERY    . Hand laceration repair    . Left carotid endarterectomy     6/06 - Dr. Scot Dock  . skin graft to leg Left   . Tibia/fibula fracture repair    . VASCULAR SURGERY        Social History:  reports that he has been smoking Cigarettes.  He has a 55.00 pack-year smoking history. He has never used smokeless tobacco. He reports that he uses drugs, including Marijuana and Oxycodone. He  reports that he does not drink alcohol.    Allergies  Allergen Reactions  . Hydrocodone Nausea Only    Family History  Problem Relation Age of Onset  . Cancer Father   . Coronary artery disease Mother          Prior to Admission medications   Medication Sig Start Date End Date Taking? Authorizing Provider  amitriptyline (ELAVIL) 150 MG tablet Take 1  tablet (150 mg total) by mouth at bedtime as needed for sleep. 09/04/16   Martinique, Betty G, MD  amitriptyline (ELAVIL) 150 MG tablet TAKE ONE TABLET BY MOUTH AT BEDTIME AS NEEDED FOR SLEEP 01/07/17   Martinique, Betty G, MD  amLODipine (NORVASC) 5 MG tablet Take 1 tablet (5 mg total) by mouth daily. 03/06/16   Martinique, Betty G, MD  amLODipine (NORVASC) 5 MG tablet TAKE ONE TABLET BY MOUTH ONCE DAILY 01/07/17   Martinique, Betty G, MD  aspirin (ASPIRIN EC) 81 MG EC tablet Take 81 mg by mouth daily. Swallow whole.    [provider]  atorvastatin (LIPITOR) 20 MG tablet TAKE ONE TABLET BY MOUTH ONCE DAILY WITH  SUPPER 12/24/16   Martinique, Betty G, MD  glipiZIDE (GLUCOTROL) 5 MG tablet Take 1 tablet (5 mg total) by mouth daily before breakfast. 12/24/15   Martinique, Betty G, MD  glucose blood (ACCU-CHEK AVIVA) test strip Use to test two times daily. (accu-chek aviva plus) 04/11/16   Martinique, Betty G, MD  Lancet Devices [facility]) lancets Use to test twice daily. (please use accu-chek) 04/11/16   Martinique, Betty G, MD  losartan (COZAAR) 25 MG tablet Take 1 tablet (25 mg total) by mouth daily. 03/06/16   Martinique, Betty G, MD  losartan (COZAAR) 25 MG tablet TAKE ONE TABLET BY MOUTH ONCE DAILY 01/07/17   Martinique, Betty G, MD     Physical Exam: Vitals:   01/07/17 1000 01/07/17 1014 01/07/17 1030 01/07/17 1100  BP: (!) 155/81  (!) 146/68   Pulse:   (!) 54 (!) 56  Resp: $Remo'13  13 16  'LTAyD$ Temp:  (!) 97.5 F (36.4 C)    TempSrc:      SpO2:   96% 98%  Weight:      Height:            Vitals:   01/07/17 1000 01/07/17 1014 01/07/17 1030 01/07/17 1100  BP: (!) 155/81  (!) 146/68   Pulse:   (!) 54 (!) 56  Resp: $Remo'13  13 16  'qNnik$ Temp:  (!) 97.5 F (36.4 C)    TempSrc:      SpO2:   96% 98%  Weight:      Height:       Constitutional: NAD, calm, comfortable Eyes: PERRL, lids and conjunctivae normal ENMT: Mucous membranes are moist. Posterior pharynx clear of any exudate or lesions.Normal dentition.  Neck: normal,  supple, no masses, no thyromegaly Respiratory: clear to auscultation bilaterally, no wheezing, no crackles. Normal respiratory effort. No accessory muscle use.  Cardiovascular: Regular rate and rhythm, no murmurs / rubs / gallops. No extremity edema. 2+ pedal pulses. No carotid bruits.  Abdomen: no tenderness, no masses palpated. No hepatosplenomegaly. Bowel sounds positive.  Musculoskeletal: no clubbing / cyanosis. No joint deformity upper and lower extremities. Good ROM, no contractures. Normal muscle tone.  Skin: no rashes, lesions, ulcers. No induration Neurologic: CN 2-12 grossly intact. Clear speech. Mild left-sided facial droop. Eye movement is intact. Good grip strength bilaterally. Overall good strength at shoulders elbows and wrists  to flexion and extension bilaterally. Some paresthesias in bilateral hands. There is a right little finger locked somewhat in flexion from tendinopathy. Straight leg raise intact bilaterally although there is pain with raising on the left side.  Psychiatric: Normal judgment and insight. Alert and oriented x 3. Normal mood.     Labs on Admission: I have personally reviewed following labs and imaging studies  CBC:  Recent Labs Lab 01/06/17 1654 01/07/17 1001  WBC 6.1 6.1  NEUTROABS 2.7 4.0  HGB 12.7* 13.2  HCT 38.6* 38.9*  MCV 95.1 92.6  PLT 127.0* 124*    Basic Metabolic Panel:  Recent Labs Lab 01/06/17 1654 01/07/17 0838 01/07/17 1001  NA 141  --  136  K 4.4  --  3.6  CL 104  --  100*  CO2 29  --  28  GLUCOSE 162*  --  167*  BUN 16  --  13  CREATININE 0.80 0.90 0.82  CALCIUM 9.6  --  8.9    GFR: Estimated Creatinine Clearance: 89 mL/min (by C-G formula based on SCr of 0.82 mg/dL).  Liver Function Tests:  Recent Labs Lab 01/07/17 1001  AST 19  ALT 25  ALKPHOS 103  BILITOT 0.5  PROT 6.2*  ALBUMIN 3.7   No results for input(s): LIPASE, AMYLASE in the last 168 hours. No results for input(s): AMMONIA in the last 168  hours.  Coagulation Profile:  Recent Labs Lab 01/07/17 1001  INR 0.99   No results for input(s): DDIMER in the last 72 hours.  Cardiac Enzymes: No results for input(s): CKTOTAL, CKMB, CKMBINDEX, TROPONINI in the last 168 hours.  BNP (last 3 results) No results for input(s): PROBNP in the last 8760 hours.  HbA1C:  Recent Labs  01/06/17 1654  HGBA1C 8.1*   Lab Results  Component Value Date   HGBA1C 8.1 (H) 01/06/2017   HGBA1C 8.3 (H) 03/06/2016   HGBA1C 7.4 (H) 11/30/2015     CBG: No results for input(s): GLUCAP in the last 168 hours.  Lipid Profile: No results for input(s): CHOL, HDL, LDLCALC, TRIG, CHOLHDL, LDLDIRECT in the last 72 hours.  Thyroid Function Tests: No results for input(s): TSH, T4TOTAL, FREET4, T3FREE, THYROIDAB in the last 72 hours.  Anemia Panel: No results for input(s): VITAMINB12, FOLATE, FERRITIN, TIBC, IRON, RETICCTPCT in the last 72 hours.  Urine analysis:    Component Value Date/Time   COLORURINE STRAW (A) 01/07/2017 [place] 01/07/2017 0944   LABSPEC 1.006 01/07/2017 0944   PHURINE 6.0 01/07/2017 0944   GLUCOSEU 50 (A) 01/07/2017 0944   HGBUR NEGATIVE 01/07/2017 0944   BILIRUBINUR NEGATIVE 01/07/2017 0944   KETONESUR NEGATIVE 01/07/2017 0944   PROTEINUR NEGATIVE 01/07/2017 0944   NITRITE NEGATIVE 01/07/2017 0944   LEUKOCYTESUR NEGATIVE 01/07/2017 0944    Sepsis Labs: $RemoveBefo'@LABRCNTIP'MciqbXJwLKh$ (procalcitonin:4,lacticidven:4) )No results found for this or any previous visit (from the past 240 hour(s)).       Radiological Exams on Admission: Dg Chest 2 View  Result Date: 01/07/2017 CLINICAL DATA:  Chest pain. EXAM: CHEST  2 VIEW COMPARISON:  Radiographs of April 16, 2015. FINDINGS: The heart size and mediastinal contours are within normal limits. Both lungs are clear. No pneumothorax or pleural effusion is noted. The visualized skeletal structures are unremarkable. IMPRESSION: No active cardiopulmonary disease.  Electronically Signed   By: Marijo Conception, M.D.   On: 01/07/2017 10:58   Mr Jodene Nam Head Wo Contrast  Result Date: 01/07/2017 CLINICAL DATA:  Left arm numbness for  1 week. Global headaches and blurry vision. EXAM: MRI HEAD WITHOUT AND WITH CONTRAST MRA HEAD WITHOUT CONTRAST TECHNIQUE: Multiplanar, multiecho pulse sequences of the brain and surrounding structures were obtained without and with intravenous contrast. Angiographic images of the head were obtained using MRA technique without contrast. CONTRAST:  32mL MULTIHANCE GADOBENATE DIMEGLUMINE 529 MG/ML IV SOLN COMPARISON:  None. FINDINGS: MRI HEAD FINDINGS Brain: Approximately 7 subcentimeter acute infarcts along the cortex and peripheral white matter of the posterior right frontal, parietal, and occipital lobes - roughly posterior watershed distribution. Curvilinear subcortical infarct in the posterior right frontal lobe at the vertex is isointense on diffusion and subtly enhancing, subacute features. No remote infarct are noted. There is mild for age microvascular ischemic type changes in the cerebral white matter. Tiny focus of susceptibility associated with the subacute infarct; no measurable hematoma. No atrophy, hydrocephalus, or masslike findings. Vascular: Arterial findings below. Normal dural venous sinus flow voids and enhancement. Skull and upper cervical spine: Negative for marrow lesion. Sinuses/Orbits: Mucosal thickening and prominent enhancement in the nasal cavity and nasopharynx. No acute sinusitis. MRA HEAD FINDINGS Symmetric carotid arteries. There is atheromatous type irregularity of bilateral carotid siphons with flow gap at the right mid cavernous segment. ACA and MCA branches show atheromatous type irregularity without branch occlusion. Right temporal branch shows moderate narrowing proximally. Symmetric vertebral arteries. Dominant right PICA and left AICA. There is atheromatous type irregularity of the vertebrals, basilar and bilateral  posterior cerebral arteries with mid left P2 and right distal P2 high-grade narrowing. Moderate bilateral V4 segment stenosis, approaching advanced on the left. Atheromatous irregularity of the proximal right PICA. These results will be called to the ordering clinician or representative by the Radiologist Assistant, and communication documented in the PACS or zVision Dashboard. IMPRESSION: 1. Small acute and subacute infarcts in the right cerebral hemisphere, posterior watershed territory. 2. Advanced intracranial atherosclerosis. 3. Advanced right cavernous ICA stenosis with flow gap. 4. Moderate to advanced atheromatous irregularity of the bilateral vertebral arteries, right PICA, and bilateral P2 segments. Electronically Signed   By: Monte Fantasia M.D.   On: 01/07/2017 09:25   Mr Jeri Cos YS Contrast  Result Date: 01/07/2017 CLINICAL DATA:  Left arm numbness for 1 week. Global headaches and blurry vision. EXAM: MRI HEAD WITHOUT AND WITH CONTRAST MRA HEAD WITHOUT CONTRAST TECHNIQUE: Multiplanar, multiecho pulse sequences of the brain and surrounding structures were obtained without and with intravenous contrast. Angiographic images of the head were obtained using MRA technique without contrast. CONTRAST:  49mL MULTIHANCE GADOBENATE DIMEGLUMINE 529 MG/ML IV SOLN COMPARISON:  None. FINDINGS: MRI HEAD FINDINGS Brain: Approximately 7 subcentimeter acute infarcts along the cortex and peripheral white matter of the posterior right frontal, parietal, and occipital lobes - roughly posterior watershed distribution. Curvilinear subcortical infarct in the posterior right frontal lobe at the vertex is isointense on diffusion and subtly enhancing, subacute features. No remote infarct are noted. There is mild for age microvascular ischemic type changes in the cerebral white matter. Tiny focus of susceptibility associated with the subacute infarct; no measurable hematoma. No atrophy, hydrocephalus, or masslike findings.  Vascular: Arterial findings below. Normal dural venous sinus flow voids and enhancement. Skull and upper cervical spine: Negative for marrow lesion. Sinuses/Orbits: Mucosal thickening and prominent enhancement in the nasal cavity and nasopharynx. No acute sinusitis. MRA HEAD FINDINGS Symmetric carotid arteries. There is atheromatous type irregularity of bilateral carotid siphons with flow gap at the right mid cavernous segment. ACA and MCA branches show atheromatous type irregularity without  branch occlusion. Right temporal branch shows moderate narrowing proximally. Symmetric vertebral arteries. Dominant right PICA and left AICA. There is atheromatous type irregularity of the vertebrals, basilar and bilateral posterior cerebral arteries with mid left P2 and right distal P2 high-grade narrowing. Moderate bilateral V4 segment stenosis, approaching advanced on the left. Atheromatous irregularity of the proximal right PICA. These results will be called to the ordering clinician or representative by the Radiologist Assistant, and communication documented in the PACS or zVision Dashboard. IMPRESSION: 1. Small acute and subacute infarcts in the right cerebral hemisphere, posterior watershed territory. 2. Advanced intracranial atherosclerosis. 3. Advanced right cavernous ICA stenosis with flow gap. 4. Moderate to advanced atheromatous irregularity of the bilateral vertebral arteries, right PICA, and bilateral P2 segments. Electronically Signed   By: Monte Fantasia M.D.   On: 01/07/2017 09:25   US Carotid Bilateral (at [facility] And Ap Only)  Result Date: 01/07/2017 CLINICAL DATA:  Left upper extremity weakness. EXAM: BILATERAL CAROTID DUPLEX ULTRASOUND TECHNIQUE: Pearline Cables scale imaging, color Doppler and duplex ultrasound were performed of bilateral carotid and vertebral arteries in the neck. COMPARISON:  MRI 01/07/2017. FINDINGS: Criteria: Quantification of carotid stenosis is based on velocity parameters that correlate the  residual internal carotid diameter with NASCET-based stenosis levels, using the diameter of the distal internal carotid lumen as the denominator for stenosis measurement. The following velocity measurements were obtained: RIGHT ICA:  84/21 cm/sec CCA:  193/79 cm/sec SYSTOLIC ICA/CCA RATIO:  0.8 DIASTOLIC ICA/CCA RATIO:  1.3 ECA:  163 cm/sec LEFT ICA:  82/25 cm/sec CCA:  02/40 cm/sec SYSTOLIC ICA/CCA RATIO:  1.1 DIASTOLIC ICA/CCA RATIO:  1.5 ECA:  85 cm/sec RIGHT CAROTID ARTERY: Mild right carotid bifurcation/ proximal ICA atherosclerotic vascular disease. No flow limiting stenosis. RIGHT VERTEBRAL ARTERY:  Patent with antegrade flow. LEFT CAROTID ARTERY: Mild left carotid bifurcation atherosclerotic vascular disease. No flow limiting stenosis. LEFT VERTEBRAL ARTERY:  Patent with antegrade flow. IMPRESSION: 1. Mild right carotid bifurcation proximal ICA atherosclerotic vascular disease. No flow limiting stenosis. Degree stenosis less than 50%. 2. Mild left carotid bifurcation atherosclerotic vascular disease. No flow limiting stenosis. Degree of stenosis less than 50%. 3.  Vertebrals are patent antegrade flow. Electronically Signed   By: Marcello Moores  Register   On: 01/07/2017 13:36   Dg Chest 2 View  Result Date: 01/07/2017 CLINICAL DATA:  Chest pain. EXAM: CHEST  2 VIEW COMPARISON:  Radiographs of April 16, 2015. FINDINGS: The heart size and mediastinal contours are within normal limits. Both lungs are clear. No pneumothorax or pleural effusion is noted. The visualized skeletal structures are unremarkable. IMPRESSION: No active cardiopulmonary disease. Electronically Signed   By: Marijo Conception, M.D.   On: 01/07/2017 10:58   Mr Jodene Nam Head Wo Contrast  Result Date: 01/07/2017 CLINICAL DATA:  Left arm numbness for 1 week. Global headaches and blurry vision. EXAM: MRI HEAD WITHOUT AND WITH CONTRAST MRA HEAD WITHOUT CONTRAST TECHNIQUE: Multiplanar, multiecho pulse sequences of the brain and surrounding structures  were obtained without and with intravenous contrast. Angiographic images of the head were obtained using MRA technique without contrast. CONTRAST:  53mL MULTIHANCE GADOBENATE DIMEGLUMINE 529 MG/ML IV SOLN COMPARISON:  None. FINDINGS: MRI HEAD FINDINGS Brain: Approximately 7 subcentimeter acute infarcts along the cortex and peripheral white matter of the posterior right frontal, parietal, and occipital lobes - roughly posterior watershed distribution. Curvilinear subcortical infarct in the posterior right frontal lobe at the vertex is isointense on diffusion and subtly enhancing, subacute features. No remote infarct are noted. There is  mild for age microvascular ischemic type changes in the cerebral white matter. Tiny focus of susceptibility associated with the subacute infarct; no measurable hematoma. No atrophy, hydrocephalus, or masslike findings. Vascular: Arterial findings below. Normal dural venous sinus flow voids and enhancement. Skull and upper cervical spine: Negative for marrow lesion. Sinuses/Orbits: Mucosal thickening and prominent enhancement in the nasal cavity and nasopharynx. No acute sinusitis. MRA HEAD FINDINGS Symmetric carotid arteries. There is atheromatous type irregularity of bilateral carotid siphons with flow gap at the right mid cavernous segment. ACA and MCA branches show atheromatous type irregularity without branch occlusion. Right temporal branch shows moderate narrowing proximally. Symmetric vertebral arteries. Dominant right PICA and left AICA. There is atheromatous type irregularity of the vertebrals, basilar and bilateral posterior cerebral arteries with mid left P2 and right distal P2 high-grade narrowing. Moderate bilateral V4 segment stenosis, approaching advanced on the left. Atheromatous irregularity of the proximal right PICA. These results will be called to the ordering clinician or representative by the Radiologist Assistant, and communication documented in the PACS or  zVision Dashboard. IMPRESSION: 1. Small acute and subacute infarcts in the right cerebral hemisphere, posterior watershed territory. 2. Advanced intracranial atherosclerosis. 3. Advanced right cavernous ICA stenosis with flow gap. 4. Moderate to advanced atheromatous irregularity of the bilateral vertebral arteries, right PICA, and bilateral P2 segments. Electronically Signed   By: Marnee Spring M.D.   On: 01/07/2017 09:25   Mr Laqueta Jean [place] Contrast  Result Date: 01/07/2017 CLINICAL DATA:  Left arm numbness for 1 week. Global headaches and blurry vision. EXAM: MRI HEAD WITHOUT AND WITH CONTRAST MRA HEAD WITHOUT CONTRAST TECHNIQUE: Multiplanar, multiecho pulse sequences of the brain and surrounding structures were obtained without and with intravenous contrast. Angiographic images of the head were obtained using MRA technique without contrast. CONTRAST:  44mL MULTIHANCE GADOBENATE DIMEGLUMINE 529 MG/ML IV SOLN COMPARISON:  None. FINDINGS: MRI HEAD FINDINGS Brain: Approximately 7 subcentimeter acute infarcts along the cortex and peripheral white matter of the posterior right frontal, parietal, and occipital lobes - roughly posterior watershed distribution. Curvilinear subcortical infarct in the posterior right frontal lobe at the vertex is isointense on diffusion and subtly enhancing, subacute features. No remote infarct are noted. There is mild for age microvascular ischemic type changes in the cerebral white matter. Tiny focus of susceptibility associated with the subacute infarct; no measurable hematoma. No atrophy, hydrocephalus, or masslike findings. Vascular: Arterial findings below. Normal dural venous sinus flow voids and enhancement. Skull and upper cervical spine: Negative for marrow lesion. Sinuses/Orbits: Mucosal thickening and prominent enhancement in the nasal cavity and nasopharynx. No acute sinusitis. MRA HEAD FINDINGS Symmetric carotid arteries. There is atheromatous type irregularity of bilateral  carotid siphons with flow gap at the right mid cavernous segment. ACA and MCA branches show atheromatous type irregularity without branch occlusion. Right temporal branch shows moderate narrowing proximally. Symmetric vertebral arteries. Dominant right PICA and left AICA. There is atheromatous type irregularity of the vertebrals, basilar and bilateral posterior cerebral arteries with mid left P2 and right distal P2 high-grade narrowing. Moderate bilateral V4 segment stenosis, approaching advanced on the left. Atheromatous irregularity of the proximal right PICA. These results will be called to the ordering clinician or representative by the Radiologist Assistant, and communication documented in the PACS or zVision Dashboard. IMPRESSION: 1. Small acute and subacute infarcts in the right cerebral hemisphere, posterior watershed territory. 2. Advanced intracranial atherosclerosis. 3. Advanced right cavernous ICA stenosis with flow gap. 4. Moderate to advanced atheromatous irregularity of the bilateral  vertebral arteries, right PICA, and bilateral P2 segments. Electronically Signed   By: Monte Fantasia M.D.   On: 01/07/2017 09:25   US Carotid Bilateral (at [facility] And Ap Only)  Result Date: 01/07/2017 CLINICAL DATA:  Left upper extremity weakness. EXAM: BILATERAL CAROTID DUPLEX ULTRASOUND TECHNIQUE: Pearline Cables scale imaging, color Doppler and duplex ultrasound were performed of bilateral carotid and vertebral arteries in the neck. COMPARISON:  MRI 01/07/2017. FINDINGS: Criteria: Quantification of carotid stenosis is based on velocity parameters that correlate the residual internal carotid diameter with NASCET-based stenosis levels, using the diameter of the distal internal carotid lumen as the denominator for stenosis measurement. The following velocity measurements were obtained: RIGHT ICA:  84/21 cm/sec CCA:  017/51 cm/sec SYSTOLIC ICA/CCA RATIO:  0.8 DIASTOLIC ICA/CCA RATIO:  1.3 ECA:  163 cm/sec LEFT ICA:  82/25 cm/sec  CCA:  02/58 cm/sec SYSTOLIC ICA/CCA RATIO:  1.1 DIASTOLIC ICA/CCA RATIO:  1.5 ECA:  85 cm/sec RIGHT CAROTID ARTERY: Mild right carotid bifurcation/ proximal ICA atherosclerotic vascular disease. No flow limiting stenosis. RIGHT VERTEBRAL ARTERY:  Patent with antegrade flow. LEFT CAROTID ARTERY: Mild left carotid bifurcation atherosclerotic vascular disease. No flow limiting stenosis. LEFT VERTEBRAL ARTERY:  Patent with antegrade flow. IMPRESSION: 1. Mild right carotid bifurcation proximal ICA atherosclerotic vascular disease. No flow limiting stenosis. Degree stenosis less than 50%. 2. Mild left carotid bifurcation atherosclerotic vascular disease. No flow limiting stenosis. Degree of stenosis less than 50%. 3.  Vertebrals are patent antegrade flow. Electronically Signed   By: Marcello Moores  Register   On: 01/07/2017 13:36      EKG: Independently reviewed.  Sinus rhythm Atrial premature complex RSR' in V1 or V2  Assessment/Plan Principal Problem:   Acute to subacute CVA (cerebral vascular accident) ([place]) MRI of the brain shows . Small acute and subacute infarcts in the right cerebral hemisphere Carotid Doppler right and left less than 50% MRA of the head Moderate to advanced atheromatous irregularity of the bilateral vertebral arteries, right PICA, and bilateral P2 segments Aspirin 325 mg a day Continue statin, lipid panel      Type 2 diabetes mellitus with neurological manifestations, uncontrolled (HCC)-continue Glucotrol, exercise, check hemoglobin A1c      Essential hypertension, benign -continue Cozaar    History of CAROTID ENDARTERECTOMY, LEFT,      PAD, he follows with Dr Gwenlyn Found, last seen in 04/2016. He has not been taking aspirin for the past 2-3 days.  Insomnia-continue amitriptyline    DVT prophylaxis: lovenox  Code Status History  Full code     consults called: neurology  Family Communication: Admission, patients condition and plan of care including tests being ordered have  been discussed with the patient  who indicates understanding and agree with the plan and Code Status  Admission status: inpatient   Disposition plan: Further plan will depend as patient's clinical course evolves and further radiologic and laboratory data become available. Likely home when stable   At the time of admission, it appears that the appropriate admission status for this patient is INPATIENT .Thisis judged to be reasonable and necessary in order to provide the required intensity of service to ensure the patient's safetygiven thepresenting symptoms, physical exam findings, and initial radiographic and laboratory data in the context of their chronic comorbidities.   Reyne Dumas MD [facility] Pager [phone]  If 7PM-7AM, please contact night-coverage [email or web address] Password [email or web address]  01/07/2017, 1:40 PM

## 2017-01-07 NOTE — ED Notes (Signed)
Meal tray

## 2017-01-07 NOTE — ED Notes (Signed)
Pt taken to xray 

## 2017-01-07 NOTE — ED Triage Notes (Signed)
Pt sent by MRI due to report of acute and subacute infarcts. Pt reports that he has experienced left sided arm numbness for a week if not longer. Pt reports 2 days ago had no use of left arm and hand but has gradually come back. Pt went to see PCP yesterday and scheduled for MRI today. Pt reports vision blurry x one month

## 2017-01-07 NOTE — ED Provider Notes (Signed)
[facility] DEPT Provider Note   CSN: [id number] Arrival date & time: 01/07/17  0935     History   Chief Complaint Chief Complaint  Patient presents with  . Numbness    HPI Ryan Butler is a 66 y.o. male.  HPI Patient presents with left-sided arm weakness. Has had it on and off for a while now. At least a week and maybe longer. Although around 2 days ago states he cannot use it all and had lifted up with the other hand. States he's been able to increase it since then and now is able to use it more. States he did not come in then because he was stubborn and that he has chronic numbness in his hands from carpal tunnel and neck problems and thought that's what it was. Also has had blurry vision on and off. States that a couple days ago he also had some difficulty speaking. States his speaking is improved and the vision has resolved also. Was seen by his primary care doctor, Betty Martinique, yesterday. MRI done as outpatient and showed acute and subacute right-sided strokes. Sent in the ER for further evaluation. Has had previous left-sided carotid endarterectomy. Past Medical History:  Diagnosis Date  . Carotid artery disease ([place])   . Cervical spondylosis   . Closed head injury 1996  . Essential hypertension, benign   . History of colonic polyps   . History of scarlet fever   . History of seizures   . History of TIAs   . Polysubstance abuse    History of cocaine in his 80's  . Type 2 diabetes mellitus ([place])    Pt stopped metformin 09/2013    Patient Active Problem List   Diagnosis Date Noted  . PVD (peripheral vascular disease) ([place]) 11/30/2015  . Insomnia 11/30/2015  . Chronic pain disorder 11/30/2015  . Spinal stenosis of lumbar region 04/25/2015  . Spinal stenosis in cervical region 12/05/2013  . Precordial pain 09/17/2011  . TOBACCO ABUSE 03/26/2006  . Headache, unspecified headache type 03/26/2006  . Type 2 diabetes mellitus with neurological manifestations,  uncontrolled ([place]) 03/24/2006  . Mixed hyperlipidemia 03/24/2006  . Essential hypertension, benign 03/24/2006  . CAROTID ENDARTERECTOMY, LEFT, HX OF 03/04/2005    Past Surgical History:  Procedure Laterality Date  . ANTERIOR CERVICAL DECOMP/DISCECTOMY FUSION N/A 12/05/2013   Procedure: ANTERIOR CERVICAL DECOMPRESSION/DISCECTOMY FUSION 2 LEVELS;  Surgeon: Elaina Hoops, MD;  Location: [facility];  Service: Neurosurgery;  Laterality: N/A;  Anterior Cervical Discectomy and Fusion with PEEK Cages and allograft Cervical Four-Five/Five-Six  . FRACTURE SURGERY    . Hand laceration repair    . Left carotid endarterectomy     6/06 - Dr. Scot Dock  . skin graft to leg Left   . Tibia/fibula fracture repair    . VASCULAR SURGERY         Home Medications    Prior to Admission medications   Medication Sig Start Date End Date Taking? Authorizing Provider  amitriptyline (ELAVIL) 150 MG tablet Take 1 tablet (150 mg total) by mouth at bedtime as needed for sleep. 09/04/16   Martinique, Betty G, MD  amitriptyline (ELAVIL) 150 MG tablet TAKE ONE TABLET BY MOUTH AT BEDTIME AS NEEDED FOR SLEEP 01/07/17   Martinique, Betty G, MD  amLODipine (NORVASC) 5 MG tablet Take 1 tablet (5 mg total) by mouth daily. 03/06/16   Martinique, Betty G, MD  amLODipine (NORVASC) 5 MG tablet TAKE ONE TABLET BY MOUTH ONCE DAILY 01/07/17  Martinique, Betty G, MD  aspirin (ASPIRIN EC) 81 MG EC tablet Take 81 mg by mouth daily. Swallow whole.    [provider]  atorvastatin (LIPITOR) 20 MG tablet TAKE ONE TABLET BY MOUTH ONCE DAILY WITH  SUPPER 12/24/16   Martinique, Betty G, MD  glipiZIDE (GLUCOTROL) 5 MG tablet Take 1 tablet (5 mg total) by mouth daily before breakfast. 12/24/15   Martinique, Betty G, MD  glucose blood (ACCU-CHEK AVIVA) test strip Use to test two times daily. (accu-chek aviva plus) 04/11/16   Martinique, Betty G, MD  Lancet Devices [facility]) lancets Use to test twice daily. (please use accu-chek) 04/11/16   Martinique, Betty G,  MD  losartan (COZAAR) 25 MG tablet Take 1 tablet (25 mg total) by mouth daily. 03/06/16   Martinique, Betty G, MD  losartan (COZAAR) 25 MG tablet TAKE ONE TABLET BY MOUTH ONCE DAILY 01/07/17   Martinique, Betty G, MD    Family History Family History  Problem Relation Age of Onset  . Cancer Father   . Coronary artery disease Mother     Social History Social History  Substance Use Topics  . Smoking status: Current Every Day Smoker    Packs/day: 1.00    Years: 55.00    Types: Cigarettes  . Smokeless tobacco: Never Used     Comment: smokes 1/2 pack per day now; reports that he starting to quick  . Alcohol use No     Comment: drinks occasional reports former drinker     Allergies   Hydrocodone   Review of Systems Review of Systems  Constitutional: Negative for appetite change.  HENT: Negative for congestion.   Cardiovascular: Positive for chest pain.       Patient has occasional rare chest pain. Pain-free now.  Musculoskeletal: Positive for back pain and gait problem.       Patient's chronic back pain and some difficulty walking due to it.  Neurological: Positive for speech difficulty, weakness, numbness and headaches.     Physical Exam Updated Vital Signs BP (!) 146/68   Pulse (!) 56   Temp (!) 97.5 F (36.4 C)   Resp 16   Ht $R'5\' 10"'gx$  (1.778 m)   Wt 73.9 kg (163 lb)   SpO2 98%   BMI 23.39 kg/m   Physical Exam  Constitutional: He is oriented to person, place, and time. He appears well-developed.  HENT:  Mild left-sided facial droop.  Eyes: EOM are normal.  Eye movements intact. Pupils somewhat constricted bilaterally.  Neck: Neck supple.  Cardiovascular: Normal rate.   Pulmonary/Chest: Effort normal.  Abdominal: Soft. There is no tenderness.  Musculoskeletal: He exhibits tenderness.  Pain with straight leg raise on left side.  Neurological: He is alert and oriented to person, place, and time.  Clear speech. Mild left-sided facial droop. Eye movement is intact. Good  grip strength bilaterally. Overall good strength at shoulders elbows and wrists to flexion and extension bilaterally. Some paresthesias in bilateral hands. There is a right little finger locked somewhat in flexion from tendinopathy. Straight leg raise intact bilaterally although there is pain with raising on the left side.  Skin: Skin is warm. Capillary refill takes less than 2 seconds.     ED Treatments / Results  Labs (all labs ordered are listed, but only abnormal results are displayed) Labs Reviewed  CBC - Abnormal; Notable for the following:       Result Value   RBC 4.20 (*)    HCT 38.9 (*)  Platelets 124 (*)    All other components within normal limits  COMPREHENSIVE METABOLIC PANEL - Abnormal; Notable for the following:    Chloride 100 (*)    Glucose, Bld 167 (*)    Total Protein 6.2 (*)    All other components within normal limits  RAPID URINE DRUG SCREEN, HOSP PERFORMED - Abnormal; Notable for the following:    Tetrahydrocannabinol POSITIVE (*)    All other components within normal limits  URINALYSIS, ROUTINE W REFLEX MICROSCOPIC - Abnormal; Notable for the following:    Color, Urine STRAW (*)    Glucose, UA 50 (*)    All other components within normal limits  ETHANOL  PROTIME-INR  APTT  DIFFERENTIAL  I-STAT TROPONIN, ED    EKG  EKG Interpretation  Date/Time:  Wednesday January 07 2017 09:45:27 EDT Ventricular Rate:  70 PR Interval:    QRS Duration: 100 QT Interval:  408 QTC Calculation: 441 R Axis:   2 Text Interpretation:  Sinus rhythm Atrial premature complex RSR' in V1 or V2, right VCD or RVH Confirmed by Davonna Belling [phone]) on 01/07/2017 11:50:30 AM       Radiology Dg Chest 2 View  Result Date: 01/07/2017 CLINICAL DATA:  Chest pain. EXAM: CHEST  2 VIEW COMPARISON:  Radiographs of April 16, 2015. FINDINGS: The heart size and mediastinal contours are within normal limits. Both lungs are clear. No pneumothorax or pleural effusion is noted. The  visualized skeletal structures are unremarkable. IMPRESSION: No active cardiopulmonary disease. Electronically Signed   By: Marijo Conception, M.D.   On: 01/07/2017 10:58   Mr Jodene Nam Head Wo Contrast  Result Date: 01/07/2017 CLINICAL DATA:  Left arm numbness for 1 week. Global headaches and blurry vision. EXAM: MRI HEAD WITHOUT AND WITH CONTRAST MRA HEAD WITHOUT CONTRAST TECHNIQUE: Multiplanar, multiecho pulse sequences of the brain and surrounding structures were obtained without and with intravenous contrast. Angiographic images of the head were obtained using MRA technique without contrast. CONTRAST:  55mL MULTIHANCE GADOBENATE DIMEGLUMINE 529 MG/ML IV SOLN COMPARISON:  None. FINDINGS: MRI HEAD FINDINGS Brain: Approximately 7 subcentimeter acute infarcts along the cortex and peripheral white matter of the posterior right frontal, parietal, and occipital lobes - roughly posterior watershed distribution. Curvilinear subcortical infarct in the posterior right frontal lobe at the vertex is isointense on diffusion and subtly enhancing, subacute features. No remote infarct are noted. There is mild for age microvascular ischemic type changes in the cerebral white matter. Tiny focus of susceptibility associated with the subacute infarct; no measurable hematoma. No atrophy, hydrocephalus, or masslike findings. Vascular: Arterial findings below. Normal dural venous sinus flow voids and enhancement. Skull and upper cervical spine: Negative for marrow lesion. Sinuses/Orbits: Mucosal thickening and prominent enhancement in the nasal cavity and nasopharynx. No acute sinusitis. MRA HEAD FINDINGS Symmetric carotid arteries. There is atheromatous type irregularity of bilateral carotid siphons with flow gap at the right mid cavernous segment. ACA and MCA branches show atheromatous type irregularity without branch occlusion. Right temporal branch shows moderate narrowing proximally. Symmetric vertebral arteries. Dominant right PICA  and left AICA. There is atheromatous type irregularity of the vertebrals, basilar and bilateral posterior cerebral arteries with mid left P2 and right distal P2 high-grade narrowing. Moderate bilateral V4 segment stenosis, approaching advanced on the left. Atheromatous irregularity of the proximal right PICA. These results will be called to the ordering clinician or representative by the Radiologist Assistant, and communication documented in the PACS or zVision Dashboard. IMPRESSION: 1. Small acute and subacute  infarcts in the right cerebral hemisphere, posterior watershed territory. 2. Advanced intracranial atherosclerosis. 3. Advanced right cavernous ICA stenosis with flow gap. 4. Moderate to advanced atheromatous irregularity of the bilateral vertebral arteries, right PICA, and bilateral P2 segments. Electronically Signed   By: Monte Fantasia M.D.   On: 01/07/2017 09:25   Mr Jeri Cos AL Contrast  Result Date: 01/07/2017 CLINICAL DATA:  Left arm numbness for 1 week. Global headaches and blurry vision. EXAM: MRI HEAD WITHOUT AND WITH CONTRAST MRA HEAD WITHOUT CONTRAST TECHNIQUE: Multiplanar, multiecho pulse sequences of the brain and surrounding structures were obtained without and with intravenous contrast. Angiographic images of the head were obtained using MRA technique without contrast. CONTRAST:  25mL MULTIHANCE GADOBENATE DIMEGLUMINE 529 MG/ML IV SOLN COMPARISON:  None. FINDINGS: MRI HEAD FINDINGS Brain: Approximately 7 subcentimeter acute infarcts along the cortex and peripheral white matter of the posterior right frontal, parietal, and occipital lobes - roughly posterior watershed distribution. Curvilinear subcortical infarct in the posterior right frontal lobe at the vertex is isointense on diffusion and subtly enhancing, subacute features. No remote infarct are noted. There is mild for age microvascular ischemic type changes in the cerebral white matter. Tiny focus of susceptibility associated with  the subacute infarct; no measurable hematoma. No atrophy, hydrocephalus, or masslike findings. Vascular: Arterial findings below. Normal dural venous sinus flow voids and enhancement. Skull and upper cervical spine: Negative for marrow lesion. Sinuses/Orbits: Mucosal thickening and prominent enhancement in the nasal cavity and nasopharynx. No acute sinusitis. MRA HEAD FINDINGS Symmetric carotid arteries. There is atheromatous type irregularity of bilateral carotid siphons with flow gap at the right mid cavernous segment. ACA and MCA branches show atheromatous type irregularity without branch occlusion. Right temporal branch shows moderate narrowing proximally. Symmetric vertebral arteries. Dominant right PICA and left AICA. There is atheromatous type irregularity of the vertebrals, basilar and bilateral posterior cerebral arteries with mid left P2 and right distal P2 high-grade narrowing. Moderate bilateral V4 segment stenosis, approaching advanced on the left. Atheromatous irregularity of the proximal right PICA. These results will be called to the ordering clinician or representative by the Radiologist Assistant, and communication documented in the PACS or zVision Dashboard. IMPRESSION: 1. Small acute and subacute infarcts in the right cerebral hemisphere, posterior watershed territory. 2. Advanced intracranial atherosclerosis. 3. Advanced right cavernous ICA stenosis with flow gap. 4. Moderate to advanced atheromatous irregularity of the bilateral vertebral arteries, right PICA, and bilateral P2 segments. Electronically Signed   By: Monte Fantasia M.D.   On: 01/07/2017 09:25    Procedures Procedures (including critical care time)  Medications Ordered in ED Medications  oxyCODONE-acetaminophen (PERCOCET/ROXICET) 5-325 MG per tablet 1 tablet (not administered)     Initial Impression / Assessment and Plan / ED Course  I have reviewed the triage vital signs and the nursing notes.  Pertinent labs &  imaging results that were available during my care of the patient were reviewed by me and considered in my medical decision making (see chart for details).     Patient presented with a positive MRI for stroke. Symptoms have been on off the last month. Actually have had symptoms improve the last couple days. Not a TPA candidate due to time of onset. Will admit to hospitalist.  Final Clinical Impressions(s) / ED Diagnoses   Final diagnoses:  Cerebrovascular accident (CVA), unspecified mechanism ([place])    New Prescriptions New Prescriptions   No medications on file     Davonna Belling, MD 01/07/17 1213

## 2017-01-07 NOTE — Consult Note (Signed)
Ryan A. Merlene Laughter, MD     [email or web address]          Ryan Butler is an 66 y.o. male.   ASSESSMENT/PLAN: 1. Embolic appearing right hemispheric stroke: Risk factors previous carotid disease, previous TIA, hypertension, diabetes, dyslipidemia and age. An echocardiography is pending. However, I think we should consider doing a 30 day event monitor to evaluate for possible Atrial Fibrillation. The patient will be placed on antiplatelet agent. He has had some intolerance to aspirin and therefore I'll recommend switching to Plavix. Continue risk factor modification with blood pressure and diabetes control. Physical and occupational therapies are recommended.   2. Polyarticular arthritis: Tramadol be added as the patient is requesting pain medications.    The patient is a 66 year old ambidextrous white male who presented with a one-week history of marked weakness involving the left upper and left lower extremity. The patient did not seek medical attention because he thought he was going to be improved. He also had significant dysarthria although this is noted more by his family and not the patient per say. He does endorse having some dizziness and visual obscuration/blurring of his vision. He denies diplopia. He denies headache but does have some chronic pain involving the hips and shoulders bilaterally. He is requesting pain medicines for his hip that is hurting. He does not report having palpitation or dyspnea although he reports having some chest discomfort on the left side. The patient was to be on 81 mg aspirin but has not taken this for a while. He apparently loss his primary care provider. He has had some abdominal discomfort with aspirin in general. He does not complain of headaches. The patient underwent successful left carotid endarterectomy a few years ago. The review systems otherwise negative.    GENERAL: This a pleasant average weight male in no acute  distress.  HEENT: Normal  ABDOMEN: soft  EXTREMITIES: No edema; evidence of muscle surgery involving the left lower extremity apparently due to complications from spider bite.   BACK:Normal  SKIN: Normal by inspection.    MENTAL STATUS: Alert and oriented Including age and month. Speech, language and cognition are generally intact. Judgment and insight normal.   CRANIAL NERVES: Pupils are equal, round and reactive to light and accomodation; extra ocular movements are full, there is no significant nystagmus; visual fields are full; upper and lower facial muscles are normal in strength and symmetric, there is no flattening of the nasolabial folds; tongue is midline; uvula is midline; shoulder elevation is normal.Visual fields are full.  MOTOR:There is mild left hip flexion weakness graded as 4+/5 which patient status due to hip pain. No drift is noted there however. There is mild drift of the left upper extremity and mild hand dexterity impairment. Hand strength and left upper extremity strength is actually good on direct testing. Right upper extra me shows normal tone and bulk, and strength. No drift. The right lower extremity shows mild drift paradoxically. Strength is good however.  COORDINATION: Left finger to nose is normal, right finger to nose is normal, No rest tremor; no intention tremor; no postural tremor; no bradykinesia.  REFLEXES: Deep tendon reflexes are symmetrical and normal. Babinski reflexes are flexor bilaterally.   SENSATION: Normal to light touch, temperature, and pinprick.There is no extinction to double simultaneous tactile or visual stimulation.     NIH stroke scale 2.     The brain MRI and MRA are both reviewed in person. There are several small cortical based infarct involving  the right parietal occipital frontal area in a watershed distribution. These involve area between the occipital and parietal area and also the paracentral frontal region. No hemorrhages  appreciated. There is mild atrophy. MRA shows no significant intracranial occlusive disease.  Blood pressure (!) 134/58, pulse 60, temperature 98.1 F (36.7 C), temperature source Oral, resp. rate 16, height _0  (1.778 m), weight 163 lb 3.2 oz (74 kg), SpO2 95 %.  Past Medical History:  Diagnosis Date  . Carotid artery disease ([place])   . Cervical spondylosis   . Closed head injury 1996  . Essential hypertension, benign   . History of colonic polyps   . History of scarlet fever   . History of seizures   . History of TIAs   . Polysubstance abuse    History of cocaine in his 44's  . Type 2 diabetes mellitus ([place])    Pt stopped metformin 09/2013    Past Surgical History:  Procedure Laterality Date  . ANTERIOR CERVICAL DECOMP/DISCECTOMY FUSION N/A 12/05/2013   Procedure: ANTERIOR CERVICAL DECOMPRESSION/DISCECTOMY FUSION 2 LEVELS;  Surgeon: Elaina Hoops, MD;  Location: [facility];  Service: Neurosurgery;  Laterality: N/A;  Anterior Cervical Discectomy and Fusion with PEEK Cages and allograft Cervical Four-Five/Five-Six  . FRACTURE SURGERY    . Hand laceration repair    . Left carotid endarterectomy     6/06 - Dr. Scot Dock  . skin graft to leg Left   . Tibia/fibula fracture repair    . VASCULAR SURGERY      Family History  Problem Relation Age of Onset  . Cancer Father   . Coronary artery disease Mother     Social History:  reports that he has been smoking Cigarettes.  He has a 55.00 pack-year smoking history. He has never used smokeless tobacco. He reports that he uses drugs, including Marijuana and Oxycodone. He reports that he does not drink alcohol.  Allergies:  Allergies  Allergen Reactions  . Hydrocodone Nausea Only    Medications: Prior to Admission medications   Medication Sig Start Date End Date Taking? Authorizing Provider  amitriptyline (ELAVIL) 150 MG tablet TAKE ONE TABLET BY MOUTH AT BEDTIME AS NEEDED FOR SLEEP 01/07/17   [place], Betty G, MD  amLODipine  (NORVASC) 5 MG tablet Take 1 tablet (5 mg total) by mouth daily. 03/06/16   [place], Betty G, MD  amLODipine (NORVASC) 5 MG tablet TAKE ONE TABLET BY MOUTH ONCE DAILY 01/07/17   [place], Betty G, MD  aspirin (ASPIRIN EC) 81 MG EC tablet Take 81 mg by mouth daily. Swallow whole.    [provider]  atorvastatin (LIPITOR) 20 MG tablet TAKE ONE TABLET BY MOUTH ONCE DAILY WITH  SUPPER 12/24/16   [place], Betty G, MD  glipiZIDE (GLUCOTROL) 5 MG tablet Take 1 tablet (5 mg total) by mouth daily before breakfast. 12/24/15   [place], Betty G, MD  glucose blood (ACCU-CHEK AVIVA) test strip Use to test two times daily. (accu-chek aviva plus) 04/11/16   [place], Betty G, MD  Lancet Devices [facility]) lancets Use to test twice daily. (please use accu-chek) 04/11/16   [place], Betty G, MD  losartan (COZAAR) 25 MG tablet Take 1 tablet (25 mg total) by mouth daily. 03/06/16   [place], Betty G, MD  losartan (COZAAR) 25 MG tablet TAKE ONE TABLET BY MOUTH ONCE DAILY 01/07/17   [place], Betty G, MD    Scheduled Meds: . aspirin  325 mg Oral Daily  . atorvastatin  40 mg  Oral q1800  . enoxaparin (LOVENOX) injection  40 mg Subcutaneous Daily  . [START ON 01/08/2017] glipiZIDE  5 mg Oral QAC breakfast  . insulin aspart  0-9 Units Subcutaneous TID WC   Continuous Infusions: PRN Meds:.acetaminophen **OR** acetaminophen, amitriptyline, levalbuterol, ondansetron **OR** ondansetron (ZOFRAN) IV     Results for orders placed or performed during the hospital encounter of 01/07/17 (from the past 48 hour(s))  Urine rapid drug screen (hosp performed)not at [facility]     Status: Abnormal   Collection Time: 01/07/17  9:44 AM  Result Value Ref Range   Opiates NONE DETECTED NONE DETECTED   Cocaine NONE DETECTED NONE DETECTED   Benzodiazepines NONE DETECTED NONE DETECTED   Amphetamines NONE DETECTED NONE DETECTED   Tetrahydrocannabinol POSITIVE (A) NONE DETECTED   Barbiturates NONE DETECTED NONE DETECTED    Comment:          DRUG SCREEN FOR MEDICAL PURPOSES ONLY.  IF CONFIRMATION IS NEEDED FOR ANY PURPOSE, NOTIFY LAB WITHIN 5 DAYS.        LOWEST DETECTABLE LIMITS FOR URINE DRUG SCREEN Drug Class       Cutoff (ng/mL) Amphetamine      1000 Barbiturate      200 Benzodiazepine   283 Tricyclics       662 Opiates          300 Cocaine          300 THC              50   Urinalysis, Routine w reflex microscopic     Status: Abnormal   Collection Time: 01/07/17  9:44 AM  Result Value Ref Range   Color, Urine STRAW (A) YELLOW   APPearance CLEAR CLEAR   Specific Gravity, Urine 1.006 1.005 - 1.030   pH 6.0 5.0 - 8.0   Glucose, UA 50 (A) NEGATIVE mg/dL   Hgb urine dipstick NEGATIVE NEGATIVE   Bilirubin Urine NEGATIVE NEGATIVE   Ketones, ur NEGATIVE NEGATIVE mg/dL   Protein, ur NEGATIVE NEGATIVE mg/dL   Nitrite NEGATIVE NEGATIVE   Leukocytes, UA NEGATIVE NEGATIVE  Ethanol     Status: None   Collection Time: 01/07/17 10:01 AM  Result Value Ref Range   Alcohol, Ethyl (B) <5 <5 mg/dL    Comment:        LOWEST DETECTABLE LIMIT FOR SERUM ALCOHOL IS 5 mg/dL FOR MEDICAL PURPOSES ONLY   Protime-INR     Status: None   Collection Time: 01/07/17 10:01 AM  Result Value Ref Range   Prothrombin Time 13.1 11.4 - 15.2 seconds   INR 0.99   APTT     Status: None   Collection Time: 01/07/17 10:01 AM  Result Value Ref Range   aPTT 29 24 - 36 seconds  CBC     Status: Abnormal   Collection Time: 01/07/17 10:01 AM  Result Value Ref Range   WBC 6.1 4.0 - 10.5 K/uL   RBC 4.20 (L) 4.22 - 5.81 MIL/uL   Hemoglobin 13.2 13.0 - 17.0 g/dL   HCT 38.9 (L) 39.0 - 52.0 %   MCV 92.6 78.0 - 100.0 fL   MCH 31.4 26.0 - 34.0 pg   MCHC 33.9 30.0 - 36.0 g/dL   RDW 13.5 11.5 - 15.5 %   Platelets 124 (L) 150 - 400 K/uL  Differential     Status: None   Collection Time: 01/07/17 10:01 AM  Result Value Ref Range   Neutrophils Relative % 67 %   Neutro Abs  4.0 1.7 - 7.7 K/uL   Lymphocytes Relative 24 %   Lymphs Abs 1.5 0.7 - 4.0 K/uL    Monocytes Relative 8 %   Monocytes Absolute 0.5 0.1 - 1.0 K/uL   Eosinophils Relative 1 %   Eosinophils Absolute 0.1 0.0 - 0.7 K/uL   Basophils Relative 0 %   Basophils Absolute 0.0 0.0 - 0.1 K/uL  Comprehensive metabolic panel     Status: Abnormal   Collection Time: 01/07/17 10:01 AM  Result Value Ref Range   Sodium 136 135 - 145 mmol/L   Potassium 3.6 3.5 - 5.1 mmol/L   Chloride 100 (L) 101 - 111 mmol/L   CO2 28 22 - 32 mmol/L   Glucose, Bld 167 (H) 65 - 99 mg/dL   BUN 13 6 - 20 mg/dL   Creatinine, Ser 0.82 0.61 - 1.24 mg/dL   Calcium 8.9 8.9 - 10.3 mg/dL   Total Protein 6.2 (L) 6.5 - 8.1 g/dL   Albumin 3.7 3.5 - 5.0 g/dL   AST 19 15 - 41 U/L   ALT 25 17 - 63 U/L   Alkaline Phosphatase 103 38 - 126 U/L   Total Bilirubin 0.5 0.3 - 1.2 mg/dL   GFR calc non Af Amer >60 >60 mL/min   GFR calc Af Amer >60 >60 mL/min    Comment: (NOTE) The eGFR has been calculated using the CKD EPI equation. This calculation has not been validated in all clinical situations. eGFR's persistently <60 mL/min signify possible Chronic Kidney Disease.    Anion gap 8 5 - 15  Troponin I     Status: None   Collection Time: 01/07/17  1:05 PM  Result Value Ref Range   Troponin I <0.03 <0.03 ng/mL  Glucose, capillary     Status: Abnormal   Collection Time: 01/07/17  4:55 PM  Result Value Ref Range   Glucose-Capillary 231 (H) 65 - 99 mg/dL   Comment 1 Notify RN    Comment 2 Document in Chart     Studies/Results:  BRAIN MRI MRA FINDINGS: MRI HEAD FINDINGS  Brain: Approximately 7 subcentimeter acute infarcts along the cortex and peripheral white matter of the posterior right frontal, parietal, and occipital lobes - roughly posterior watershed distribution. Curvilinear subcortical infarct in the posterior right frontal lobe at the vertex is isointense on diffusion and subtly enhancing, subacute features. No remote infarct are noted. There is mild for age microvascular ischemic type changes in  the cerebral white matter. Tiny focus of susceptibility associated with the subacute infarct; no measurable hematoma. No atrophy, hydrocephalus, or masslike findings.  Vascular: Arterial findings below. Normal dural venous sinus flow voids and enhancement.  Skull and upper cervical spine: Negative for marrow lesion.  Sinuses/Orbits: Mucosal thickening and prominent enhancement in the nasal cavity and nasopharynx. No acute sinusitis.  MRA HEAD FINDINGS  Symmetric carotid arteries. There is atheromatous type irregularity of bilateral carotid siphons with flow gap at the right mid cavernous segment. ACA and MCA branches show atheromatous type irregularity without branch occlusion. Right temporal branch shows moderate narrowing proximally. Symmetric vertebral arteries. Dominant right PICA and left AICA. There is atheromatous type irregularity of the vertebrals, basilar and bilateral posterior cerebral arteries with mid left P2 and right distal P2 high-grade narrowing. Moderate bilateral V4 segment stenosis, approaching advanced on the left. Atheromatous irregularity of the proximal right PICA.  These results will be called to the ordering clinician or representative by the Radiologist Assistant, and communication documented in the PACS or  zVision Dashboard.  IMPRESSION: 1. Small acute and subacute infarcts in the right cerebral hemisphere, posterior watershed territory. 2. Advanced intracranial atherosclerosis. 3. Advanced right cavernous ICA stenosis with flow gap. 4. Moderate to advanced atheromatous irregularity of the bilateral vertebral arteries, right PICA, and bilateral P2 segments      Echocardiography pending   Carotid duplex Doppler shows normal velocities and no evidence of stenosis.    Airyana Sprunger A. Merlene Butler, M.D.  Diplomate, Tax adviser of Psychiatry and Neurology ( Neurology). 01/07/2017, 6:14 PM

## 2017-01-07 NOTE — Addendum Note (Signed)
Addended by: Netta Neat D on: 01/07/2017 03:22 PM   Modules accepted: Orders

## 2017-01-08 ENCOUNTER — Encounter (INDEPENDENT_AMBULATORY_CARE_PROVIDER_SITE_OTHER): Payer: Self-pay

## 2017-01-08 ENCOUNTER — Inpatient Hospital Stay (HOSPITAL_COMMUNITY): Payer: Medicare Other

## 2017-01-08 ENCOUNTER — Encounter (INDEPENDENT_AMBULATORY_CARE_PROVIDER_SITE_OTHER): Payer: Medicare Other

## 2017-01-08 ENCOUNTER — Other Ambulatory Visit: Payer: Self-pay

## 2017-01-08 DIAGNOSIS — M25511 Pain in right shoulder: Secondary | ICD-10-CM | POA: Diagnosis not present

## 2017-01-08 DIAGNOSIS — E1149 Type 2 diabetes mellitus with other diabetic neurological complication: Secondary | ICD-10-CM | POA: Diagnosis not present

## 2017-01-08 DIAGNOSIS — M25512 Pain in left shoulder: Secondary | ICD-10-CM | POA: Diagnosis not present

## 2017-01-08 DIAGNOSIS — M25551 Pain in right hip: Secondary | ICD-10-CM | POA: Diagnosis not present

## 2017-01-08 DIAGNOSIS — G8929 Other chronic pain: Secondary | ICD-10-CM | POA: Diagnosis not present

## 2017-01-08 DIAGNOSIS — I498 Other specified cardiac arrhythmias: Secondary | ICD-10-CM | POA: Diagnosis not present

## 2017-01-08 DIAGNOSIS — E1165 Type 2 diabetes mellitus with hyperglycemia: Secondary | ICD-10-CM | POA: Diagnosis not present

## 2017-01-08 DIAGNOSIS — I499 Cardiac arrhythmia, unspecified: Secondary | ICD-10-CM | POA: Diagnosis not present

## 2017-01-08 DIAGNOSIS — Z9889 Other specified postprocedural states: Secondary | ICD-10-CM | POA: Diagnosis not present

## 2017-01-08 DIAGNOSIS — G8194 Hemiplegia, unspecified affecting left nondominant side: Secondary | ICD-10-CM | POA: Diagnosis not present

## 2017-01-08 DIAGNOSIS — Z885 Allergy status to narcotic agent status: Secondary | ICD-10-CM | POA: Diagnosis not present

## 2017-01-08 DIAGNOSIS — I639 Cerebral infarction, unspecified: Secondary | ICD-10-CM | POA: Diagnosis not present

## 2017-01-08 DIAGNOSIS — I63011 Cerebral infarction due to thrombosis of right vertebral artery: Secondary | ICD-10-CM

## 2017-01-08 DIAGNOSIS — I10 Essential (primary) hypertension: Secondary | ICD-10-CM | POA: Diagnosis not present

## 2017-01-08 DIAGNOSIS — I6340 Cerebral infarction due to embolism of unspecified cerebral artery: Secondary | ICD-10-CM | POA: Diagnosis not present

## 2017-01-08 DIAGNOSIS — G4700 Insomnia, unspecified: Secondary | ICD-10-CM | POA: Diagnosis not present

## 2017-01-08 DIAGNOSIS — Z7982 Long term (current) use of aspirin: Secondary | ICD-10-CM | POA: Diagnosis not present

## 2017-01-08 DIAGNOSIS — M25552 Pain in left hip: Secondary | ICD-10-CM | POA: Diagnosis not present

## 2017-01-08 DIAGNOSIS — E782 Mixed hyperlipidemia: Secondary | ICD-10-CM | POA: Diagnosis not present

## 2017-01-08 DIAGNOSIS — Z7984 Long term (current) use of oral hypoglycemic drugs: Secondary | ICD-10-CM | POA: Diagnosis not present

## 2017-01-08 DIAGNOSIS — G459 Transient cerebral ischemic attack, unspecified: Secondary | ICD-10-CM | POA: Diagnosis not present

## 2017-01-08 DIAGNOSIS — I340 Nonrheumatic mitral (valve) insufficiency: Secondary | ICD-10-CM

## 2017-01-08 DIAGNOSIS — Z981 Arthrodesis status: Secondary | ICD-10-CM | POA: Diagnosis not present

## 2017-01-08 LAB — LIPID PANEL
Cholesterol: 106 mg/dL (ref 0–200)
HDL: 46 mg/dL (ref >40)
LDL Cholesterol: 53 mg/dL (ref 0–99)
TRIGLYCERIDES: 35 mg/dL (ref <150)
Total CHOL/HDL Ratio: 2.3 RATIO
VLDL: 7 mg/dL (ref 0–40)

## 2017-01-08 LAB — BASIC METABOLIC PANEL
Anion gap: 7 (ref 5–15)
BUN: 10 mg/dL (ref 6–20)
CALCIUM: 8.5 mg/dL — AB (ref 8.9–10.3)
CO2: 29 mmol/L (ref 22–32)
CREATININE: 0.73 mg/dL (ref 0.61–1.24)
Chloride: 104 mmol/L (ref 101–111)
GFR calc Af Amer: >60 mL/min (ref >60)
GFR calc non Af Amer: >60 mL/min (ref >60)
GLUCOSE: 142 mg/dL — AB (ref 65–99)
Potassium: 3.8 mmol/L (ref 3.5–5.1)
Sodium: 140 mmol/L (ref 135–145)

## 2017-01-08 LAB — HEMOGLOBIN A1C
HEMOGLOBIN A1C: 7.6 % — AB (ref 4.8–5.6)
MEAN PLASMA GLUCOSE: 171.42 mg/dL

## 2017-01-08 LAB — GLUCOSE, CAPILLARY
Glucose-Capillary: 126 mg/dL — ABNORMAL HIGH (ref 65–99)
Glucose-Capillary: 77 mg/dL (ref 65–99)

## 2017-01-08 LAB — CBC
HEMATOCRIT: 36 % — AB (ref 39.0–52.0)
Hemoglobin: 12.4 g/dL — ABNORMAL LOW (ref 13.0–17.0)
MCH: 31.3 pg (ref 26.0–34.0)
MCHC: 34.4 g/dL (ref 30.0–36.0)
MCV: 90.9 fL (ref 78.0–100.0)
Platelets: 122 10*3/uL — ABNORMAL LOW (ref 150–400)
RBC: 3.96 MIL/uL — ABNORMAL LOW (ref 4.22–5.81)
RDW: 13.7 % (ref 11.5–15.5)
WBC: 4.1 10*3/uL (ref 4.0–10.5)

## 2017-01-08 LAB — ECHOCARDIOGRAM COMPLETE
Height: 70 in
WEIGHTICAEL: 2611.2 [oz_av]

## 2017-01-08 LAB — FRUCTOSAMINE: FRUCTOSAMINE: 320 umol/L — AB (ref 190–270)

## 2017-01-08 MED ORDER — TRAMADOL HCL 50 MG PO TABS: 50.0000 mg | ORAL_TABLET | Freq: Four times a day (QID) | ORAL | 1 refills | Status: DC | PRN

## 2017-01-08 MED ORDER — CLOPIDOGREL BISULFATE 75 MG PO TABS: 75.0000 mg | ORAL_TABLET | Freq: Every day | ORAL | 11 refills | Status: AC

## 2017-01-08 MED ORDER — STROKE: EARLY STAGES OF RECOVERY BOOK
Freq: Once | Status: DC
  Filled 2017-01-08: qty 1

## 2017-01-08 MED ORDER — ATORVASTATIN CALCIUM 40 MG PO TABS: 40.0000 mg | ORAL_TABLET | Freq: Every day | ORAL | 1 refills | Status: DC

## 2017-01-08 MED ORDER — AMLODIPINE BESYLATE 5 MG PO TABS: 5.0000 mg | ORAL_TABLET | Freq: Every day | ORAL | 1 refills | Status: DC

## 2017-01-08 MED ORDER — LOSARTAN POTASSIUM 25 MG PO TABS: 25.0000 mg | ORAL_TABLET | Freq: Every day | ORAL | 1 refills | Status: DC

## 2017-01-08 NOTE — Progress Notes (Signed)
*  PRELIMINARY RESULTS* Echocardiogram 2D Echocardiogram has been performed.  Leavy Cella 01/08/2017, 11:26 AM
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
## 2017-01-08 NOTE — Progress Notes (Signed)
2326 neuro check or vital signs not completed, pt family stated earlier that pt was restless and wanted something for sleep. Pt given PRN sleep med and not disturbed for the 2326 neuro assessment, will re-assess at 0126.

## 2017-01-08 NOTE — Care Management Note (Signed)
Case Management Note  Patient Details  Name: Ryan Butler MRN: [id number] Date of Birth: July 18, 1950  Subjective/Objective:                  Admitted with CVA. From home, not alone. Pt has PCP, transportation and insurance with drug coverage. No DME needs. recommended for OP PT/OT. Pt hesitant but daughter persisitant referral be made. Pt lives in [place], wants OP rehab in [place]. Daughter asks to be called about appointment as she will be the one to make sure he gets there. No other needs or concerns communicated.   Action/Plan: DC home today with OP PT/OT referral.   Expected Discharge Date:    01/08/2017              Expected Discharge Plan:  Home/Self Care  In-House Referral:  NA  Discharge planning Services  CM Consult  Post Acute Care Choice:  NA Choice offered to:  NA  Status of Service:  Completed, signed off  Sherald Barge, RN 01/08/2017, 10:58 AM

## 2017-01-08 NOTE — Discharge Summary (Signed)
Physician Discharge Summary  Ryan Butler MRN: [id number] DOB/AGE: 02-19-1951 66 y.o.  PCP: [place], Betty G, MD   Admit date: 01/07/2017 Discharge date: 01/08/2017  Discharge Diagnoses:    Principal Problem:   CVA (cerebral vascular accident) [facility]) Active Problems:   Type 2 diabetes mellitus with neurological manifestations, uncontrolled ([place])   Mixed hyperlipidemia   Essential hypertension, benign   CAROTID ENDARTERECTOMY, LEFT, HX OF    Follow-up recommendations Follow-up with PCP in 3-5 days , including all  additional recommended appointments as below Follow-up CBC, CMP in 3-5 days Patient would need to follow-up with neurology Dr. Phillips Odor, MD, in 4-6 weeks      Current Discharge Medication List    START taking these medications   Details  clopidogrel (PLAVIX) 75 MG tablet Take 1 tablet (75 mg total) by mouth daily with breakfast. Qty: 30 tablet, Refills: 11    traMADol (ULTRAM) 50 MG tablet Take 1 tablet (50 mg total) by mouth every 6 (six) hours as needed (Pain). Qty: 30 tablet, Refills: 1      CONTINUE these medications which have CHANGED   Details  amLODipine (NORVASC) 5 MG tablet Take 1 tablet (5 mg total) by mouth daily. Qty: 90 tablet, Refills: 1   Associated Diagnoses: Essential hypertension, benign    atorvastatin (LIPITOR) 40 MG tablet Take 1 tablet (40 mg total) by mouth daily at 6 PM. Qty: 30 tablet, Refills: 1    losartan (COZAAR) 25 MG tablet Take 1 tablet (25 mg total) by mouth daily. Qty: 90 tablet, Refills: 1   Associated Diagnoses: Essential hypertension, benign      CONTINUE these medications which have NOT CHANGED   Details  amitriptyline (ELAVIL) 150 MG tablet TAKE ONE TABLET BY MOUTH AT BEDTIME AS NEEDED FOR SLEEP Qty: 90 tablet, Refills: 1    glipiZIDE (GLUCOTROL) 5 MG tablet Take 1 tablet (5 mg total) by mouth daily before breakfast. Qty: 60 tablet, Refills: 3         Discharge Condition:Stable   Discharge  Instructions Get Medicines reviewed and adjusted: Please take all your medications with you for your next visit with your Primary MD  Please request your Primary MD to go over all hospital tests and procedure/radiological results at the follow up, please ask your Primary MD to get all Hospital records sent to his/her office.  If you experience worsening of your admission symptoms, develop shortness of breath, life threatening emergency, suicidal or homicidal thoughts you must seek medical attention immediately by calling 911 or calling your MD immediately if symptoms less severe.  You must read complete instructions/literature along with all the possible adverse reactions/side effects for all the Medicines you take and that have been prescribed to you. Take any new Medicines after you have completely understood and accpet all the possible adverse reactions/side effects.   Do not drive when taking Pain medications.   Do not take more than prescribed Pain, Sleep and Anxiety Medications  Special Instructions: If you have smoked or chewed Tobacco in the last 2 yrs please stop smoking, stop any regular Alcohol and or any Recreational drug use.  Wear Seat belts while driving.  Please note  You were cared for by a hospitalist during your hospital stay. Once you are discharged, your primary care physician will handle any further medical issues. Please note that NO REFILLS for any discharge medications will be authorized once you are discharged, as it is imperative that you return to your primary care physician (or  establish a relationship with a primary care physician if you do not have one) for your aftercare needs so that they can reassess your need for medications and monitor your lab values.  Discharge Instructions    Ambulatory referral to Occupational Therapy    Complete by:  As directed    Ambulatory referral to Physical Therapy    Complete by:  As directed    Diet - low sodium heart  healthy    Complete by:  As directed    Increase activity slowly    Complete by:  As directed        Allergies  Allergen Reactions  . Aspirin Nausea And Vomiting  . Hydrocodone Nausea Only      Disposition: 01-Home or Self Care   Consults:  Neurology  Echo LV EF: 50%  ------------------------------------------------------------------- Indications:      CVA 436.  ------------------------------------------------------------------- History:   PMH:  CAROTID ENDARTERECTOMY.  Stroke.  Risk factors: Current tobacco use. Hypertension. Diabetes mellitus. Dyslipidemia.   ------------------------------------------------------------------- Study Conclusions  - Left ventricle: The cavity size was normal. Wall thickness was   increased in a pattern of mild LVH. Systolic function was at the   lower limits of normal. The estimated ejection fraction was 50%.   Wall motion was normal; there were no regional wall motion   abnormalities. Features are consistent with a pseudonormal left   ventricular filling pattern, with concomitant abnormal relaxation   and increased filling pressure (grade 2 diastolic dysfunction).   Doppler parameters are consistent with high ventricular filling   pressure. - Aortic valve: Sclerosis without stenosis. - Mitral valve: There was mild regurgitation. - Atrial septum: No defect or patent foramen ovale was identified. - Tricuspid valve: There was mild regurgitation  Significant Diagnostic Studies:  Dg Chest 2 View  Result Date: 01/07/2017 CLINICAL DATA:  Chest pain. EXAM: CHEST  2 VIEW COMPARISON:  Radiographs of April 16, 2015. FINDINGS: The heart size and mediastinal contours are within normal limits. Both lungs are clear. No pneumothorax or pleural effusion is noted. The visualized skeletal structures are unremarkable. IMPRESSION: No active cardiopulmonary disease. Electronically Signed   By: Marijo Conception, M.D.   On: 01/07/2017 10:58   Mr  Ryan Butler Head Wo Contrast  Result Date: 01/07/2017 CLINICAL DATA:  Left arm numbness for 1 week. Global headaches and blurry vision. EXAM: MRI HEAD WITHOUT AND WITH CONTRAST MRA HEAD WITHOUT CONTRAST TECHNIQUE: Multiplanar, multiecho pulse sequences of the brain and surrounding structures were obtained without and with intravenous contrast. Angiographic images of the head were obtained using MRA technique without contrast. CONTRAST:  66m MULTIHANCE GADOBENATE DIMEGLUMINE 529 MG/ML IV SOLN COMPARISON:  None. FINDINGS: MRI HEAD FINDINGS Brain: Approximately 7 subcentimeter acute infarcts along the cortex and peripheral white matter of the posterior right frontal, parietal, and occipital lobes - roughly posterior watershed distribution. Curvilinear subcortical infarct in the posterior right frontal lobe at the vertex is isointense on diffusion and subtly enhancing, subacute features. No remote infarct are noted. There is mild for age microvascular ischemic type changes in the cerebral white matter. Tiny focus of susceptibility associated with the subacute infarct; no measurable hematoma. No atrophy, hydrocephalus, or masslike findings. Vascular: Arterial findings below. Normal dural venous sinus flow voids and enhancement. Skull and upper cervical spine: Negative for marrow lesion. Sinuses/Orbits: Mucosal thickening and prominent enhancement in the nasal cavity and nasopharynx. No acute sinusitis. MRA HEAD FINDINGS Symmetric carotid arteries. There is atheromatous type irregularity of bilateral carotid siphons with flow  gap at the right mid cavernous segment. ACA and MCA branches show atheromatous type irregularity without branch occlusion. Right temporal branch shows moderate narrowing proximally. Symmetric vertebral arteries. Dominant right PICA and left AICA. There is atheromatous type irregularity of the vertebrals, basilar and bilateral posterior cerebral arteries with mid left P2 and right distal P2 high-grade  narrowing. Moderate bilateral V4 segment stenosis, approaching advanced on the left. Atheromatous irregularity of the proximal right PICA. These results will be called to the ordering clinician or representative by the Radiologist Assistant, and communication documented in the PACS or zVision Dashboard. IMPRESSION: 1. Small acute and subacute infarcts in the right cerebral hemisphere, posterior watershed territory. 2. Advanced intracranial atherosclerosis. 3. Advanced right cavernous ICA stenosis with flow gap. 4. Moderate to advanced atheromatous irregularity of the bilateral vertebral arteries, right PICA, and bilateral P2 segments. Electronically Signed   By: Monte Fantasia M.D.   On: 01/07/2017 09:25   Mr Ryan Butler KX Contrast  Result Date: 01/07/2017 CLINICAL DATA:  Left arm numbness for 1 week. Global headaches and blurry vision. EXAM: MRI HEAD WITHOUT AND WITH CONTRAST MRA HEAD WITHOUT CONTRAST TECHNIQUE: Multiplanar, multiecho pulse sequences of the brain and surrounding structures were obtained without and with intravenous contrast. Angiographic images of the head were obtained using MRA technique without contrast. CONTRAST:  59m MULTIHANCE GADOBENATE DIMEGLUMINE 529 MG/ML IV SOLN COMPARISON:  None. FINDINGS: MRI HEAD FINDINGS Brain: Approximately 7 subcentimeter acute infarcts along the cortex and peripheral white matter of the posterior right frontal, parietal, and occipital lobes - roughly posterior watershed distribution. Curvilinear subcortical infarct in the posterior right frontal lobe at the vertex is isointense on diffusion and subtly enhancing, subacute features. No remote infarct are noted. There is mild for age microvascular ischemic type changes in the cerebral white matter. Tiny focus of susceptibility associated with the subacute infarct; no measurable hematoma. No atrophy, hydrocephalus, or masslike findings. Vascular: Arterial findings below. Normal dural venous sinus flow voids and  enhancement. Skull and upper cervical spine: Negative for marrow lesion. Sinuses/Orbits: Mucosal thickening and prominent enhancement in the nasal cavity and nasopharynx. No acute sinusitis. MRA HEAD FINDINGS Symmetric carotid arteries. There is atheromatous type irregularity of bilateral carotid siphons with flow gap at the right mid cavernous segment. ACA and MCA branches show atheromatous type irregularity without branch occlusion. Right temporal branch shows moderate narrowing proximally. Symmetric vertebral arteries. Dominant right PICA and left AICA. There is atheromatous type irregularity of the vertebrals, basilar and bilateral posterior cerebral arteries with mid left P2 and right distal P2 high-grade narrowing. Moderate bilateral V4 segment stenosis, approaching advanced on the left. Atheromatous irregularity of the proximal right PICA. These results will be called to the ordering clinician or representative by the Radiologist Assistant, and communication documented in the PACS or zVision Dashboard. IMPRESSION: 1. Small acute and subacute infarcts in the right cerebral hemisphere, posterior watershed territory. 2. Advanced intracranial atherosclerosis. 3. Advanced right cavernous ICA stenosis with flow gap. 4. Moderate to advanced atheromatous irregularity of the bilateral vertebral arteries, right PICA, and bilateral P2 segments. Electronically Signed   By: JMonte FantasiaM.D.   On: 01/07/2017 09:25   [place] Bilateral (at [facility] Only)  Result Date: 01/07/2017 CLINICAL DATA:  Left upper extremity weakness. EXAM: BILATERAL CAROTID DUPLEX ULTRASOUND TECHNIQUE: GPearline Cablesscale imaging, color Doppler and duplex ultrasound were performed of bilateral carotid and vertebral arteries in the neck. COMPARISON:  MRI 01/07/2017. FINDINGS: Criteria: Quantification of carotid stenosis is based on velocity parameters  that correlate the residual internal carotid diameter with NASCET-based stenosis levels, using  the diameter of the distal internal carotid lumen as the denominator for stenosis measurement. The following velocity measurements were obtained: RIGHT ICA:  84/21 cm/sec CCA:  903/00 cm/sec SYSTOLIC ICA/CCA RATIO:  0.8 DIASTOLIC ICA/CCA RATIO:  1.3 ECA:  163 cm/sec LEFT ICA:  82/25 cm/sec CCA:  92/33 cm/sec SYSTOLIC ICA/CCA RATIO:  1.1 DIASTOLIC ICA/CCA RATIO:  1.5 ECA:  85 cm/sec RIGHT CAROTID ARTERY: Mild right carotid bifurcation/ proximal ICA atherosclerotic vascular disease. No flow limiting stenosis. RIGHT VERTEBRAL ARTERY:  Patent with antegrade flow. LEFT CAROTID ARTERY: Mild left carotid bifurcation atherosclerotic vascular disease. No flow limiting stenosis. LEFT VERTEBRAL ARTERY:  Patent with antegrade flow. IMPRESSION: 1. Mild right carotid bifurcation proximal ICA atherosclerotic vascular disease. No flow limiting stenosis. Degree stenosis less than 50%. 2. Mild left carotid bifurcation atherosclerotic vascular disease. No flow limiting stenosis. Degree of stenosis less than 50%. 3.  Vertebrals are patent antegrade flow. Electronically Signed   By: Marcello Moores  Register   On: 01/07/2017 13:36    Echo ........................   Filed Weights   01/07/17 0947 01/07/17 1656  Weight: 73.9 kg (163 lb) 74 kg (163 lb 3.2 oz)     Microbiology: No results found for this or any previous visit (from the past 240 hour(s)).     Blood Culture No results found for: SDES, [place], CULT, REPTSTATUS    Labs: Results for orders placed or performed during the hospital encounter of 01/07/17 (from the past 48 hour(s))  Urine rapid drug screen (hosp performed)not at [facility]     Status: Abnormal   Collection Time: 01/07/17  9:44 AM  Result Value Ref Range   Opiates NONE DETECTED NONE DETECTED   Cocaine NONE DETECTED NONE DETECTED   Benzodiazepines NONE DETECTED NONE DETECTED   Amphetamines NONE DETECTED NONE DETECTED   Tetrahydrocannabinol POSITIVE (A) NONE DETECTED   Barbiturates NONE DETECTED NONE  DETECTED    Comment:        DRUG SCREEN FOR MEDICAL PURPOSES ONLY.  IF CONFIRMATION IS NEEDED FOR ANY PURPOSE, NOTIFY LAB WITHIN 5 DAYS.        LOWEST DETECTABLE LIMITS FOR URINE DRUG SCREEN Drug Class       Cutoff (ng/mL) Amphetamine      1000 Barbiturate      200 Benzodiazepine   007 Tricyclics       622 Opiates          300 Cocaine          300 THC              50   Urinalysis, Routine w reflex microscopic     Status: Abnormal   Collection Time: 01/07/17  9:44 AM  Result Value Ref Range   Color, Urine STRAW (A) YELLOW   APPearance CLEAR CLEAR   Specific Gravity, Urine 1.006 1.005 - 1.030   pH 6.0 5.0 - 8.0   Glucose, UA 50 (A) NEGATIVE mg/dL   Hgb urine dipstick NEGATIVE NEGATIVE   Bilirubin Urine NEGATIVE NEGATIVE   Ketones, ur NEGATIVE NEGATIVE mg/dL   Protein, ur NEGATIVE NEGATIVE mg/dL   Nitrite NEGATIVE NEGATIVE   Leukocytes, UA NEGATIVE NEGATIVE  Hemoglobin A1c     Status: Abnormal   Collection Time: 01/07/17 10:00 AM  Result Value Ref Range   Hgb A1c MFr Bld 7.7 (H) 4.8 - 5.6 %    Comment: (NOTE) Pre diabetes:  5.7%-6.4% Diabetes:              >6.4% Glycemic control for   <7.0% adults with diabetes    Mean Plasma Glucose 174.29 mg/dL    Comment: Performed at [facility] [address], [place], [place] [address]  Ethanol     Status: None   Collection Time: 01/07/17 10:01 AM  Result Value Ref Range   Alcohol, Ethyl (B) <5 <5 mg/dL    Comment:        LOWEST DETECTABLE LIMIT FOR SERUM ALCOHOL IS 5 mg/dL FOR MEDICAL PURPOSES ONLY   Protime-INR     Status: None   Collection Time: 01/07/17 10:01 AM  Result Value Ref Range   Prothrombin Time 13.1 11.4 - 15.2 seconds   INR 0.99   APTT     Status: None   Collection Time: 01/07/17 10:01 AM  Result Value Ref Range   aPTT 29 24 - 36 seconds  CBC     Status: Abnormal   Collection Time: 01/07/17 10:01 AM  Result Value Ref Range   WBC 6.1 4.0 - 10.5 K/uL   RBC 4.20 (L) 4.22 - 5.81 MIL/uL    Hemoglobin 13.2 13.0 - 17.0 g/dL   HCT 38.9 (L) 39.0 - 52.0 %   MCV 92.6 78.0 - 100.0 fL   MCH 31.4 26.0 - 34.0 pg   MCHC 33.9 30.0 - 36.0 g/dL   RDW 13.5 11.5 - 15.5 %   Platelets 124 (L) 150 - 400 K/uL  Differential     Status: None   Collection Time: 01/07/17 10:01 AM  Result Value Ref Range   Neutrophils Relative % 67 %   Neutro Abs 4.0 1.7 - 7.7 K/uL   Lymphocytes Relative 24 %   Lymphs Abs 1.5 0.7 - 4.0 K/uL   Monocytes Relative 8 %   Monocytes Absolute 0.5 0.1 - 1.0 K/uL   Eosinophils Relative 1 %   Eosinophils Absolute 0.1 0.0 - 0.7 K/uL   Basophils Relative 0 %   Basophils Absolute 0.0 0.0 - 0.1 K/uL  Comprehensive metabolic panel     Status: Abnormal   Collection Time: 01/07/17 10:01 AM  Result Value Ref Range   Sodium 136 135 - 145 mmol/L   Potassium 3.6 3.5 - 5.1 mmol/L   Chloride 100 (L) 101 - 111 mmol/L   CO2 28 22 - 32 mmol/L   Glucose, Bld 167 (H) 65 - 99 mg/dL   BUN 13 6 - 20 mg/dL   Creatinine, Ser 0.82 0.61 - 1.24 mg/dL   Calcium 8.9 8.9 - 10.3 mg/dL   Total Protein 6.2 (L) 6.5 - 8.1 g/dL   Albumin 3.7 3.5 - 5.0 g/dL   AST 19 15 - 41 U/L   ALT 25 17 - 63 U/L   Alkaline Phosphatase 103 38 - 126 U/L   Total Bilirubin 0.5 0.3 - 1.2 mg/dL   GFR calc non Af Amer >60 >60 mL/min   GFR calc Af Amer >60 >60 mL/min    Comment: (NOTE) The eGFR has been calculated using the CKD EPI equation. This calculation has not been validated in all clinical situations. eGFR's persistently <60 mL/min signify possible Chronic Kidney Disease.    Anion gap 8 5 - 15  Troponin I     Status: None   Collection Time: 01/07/17  1:05 PM  Result Value Ref Range   Troponin I <0.03 <0.03 ng/mL  Glucose, capillary     Status: Abnormal  Collection Time: 01/07/17  4:55 PM  Result Value Ref Range   Glucose-Capillary 231 (H) 65 - 99 mg/dL   Comment 1 Notify RN    Comment 2 Document in Chart   Glucose, capillary     Status: Abnormal   Collection Time: 01/07/17 11:52 PM  Result  Value Ref Range   Glucose-Capillary 126 (H) 65 - 99 mg/dL  Lipid panel     Status: None   Collection Time: 01/08/17  5:33 AM  Result Value Ref Range   Cholesterol 106 0 - 200 mg/dL   Triglycerides 35 <150 mg/dL   HDL 46 >40 mg/dL   Total CHOL/HDL Ratio 2.3 RATIO   VLDL 7 0 - 40 mg/dL   LDL Cholesterol 53 0 - 99 mg/dL    Comment:        Total Cholesterol/HDL:CHD Risk Coronary Heart Disease Risk Table                     Men   Women  1/2 Average Risk   3.4   3.3  Average Risk       5.0   4.4  2 X Average Risk   9.6   7.1  3 X Average Risk  23.4   11.0        Use the calculated Patient Ratio above and the CHD Risk Table to determine the patient's CHD Risk.        ATP III CLASSIFICATION (LDL):  <100     mg/dL   Optimal  100-129  mg/dL   Near or Above                    Optimal  130-159  mg/dL   Borderline  160-189  mg/dL   High  >190     mg/dL   Very High   CBC     Status: Abnormal   Collection Time: 01/08/17  5:33 AM  Result Value Ref Range   WBC 4.1 4.0 - 10.5 K/uL   RBC 3.96 (L) 4.22 - 5.81 MIL/uL   Hemoglobin 12.4 (L) 13.0 - 17.0 g/dL   HCT 36.0 (L) 39.0 - 52.0 %   MCV 90.9 78.0 - 100.0 fL   MCH 31.3 26.0 - 34.0 pg   MCHC 34.4 30.0 - 36.0 g/dL   RDW 13.7 11.5 - 15.5 %   Platelets 122 (L) 150 - 400 K/uL  Basic metabolic panel     Status: Abnormal   Collection Time: 01/08/17  5:33 AM  Result Value Ref Range   Sodium 140 135 - 145 mmol/L   Potassium 3.8 3.5 - 5.1 mmol/L   Chloride 104 101 - 111 mmol/L   CO2 29 22 - 32 mmol/L   Glucose, Bld 142 (H) 65 - 99 mg/dL   BUN 10 6 - 20 mg/dL   Creatinine, Ser 0.73 0.61 - 1.24 mg/dL   Calcium 8.5 (L) 8.9 - 10.3 mg/dL   GFR calc non Af Amer >60 >60 mL/min   GFR calc Af Amer >60 >60 mL/min    Comment: (NOTE) The eGFR has been calculated using the CKD EPI equation. This calculation has not been validated in all clinical situations. eGFR's persistently <60 mL/min signify possible Chronic Kidney Disease.    Anion gap 7 5  - 15  Glucose, capillary     Status: Abnormal   Collection Time: 01/08/17  7:27 AM  Result Value Ref Range   Glucose-Capillary 126 (H) 65 - 99  mg/dL   Comment 1 Notify RN    Comment 2 Document in Chart      Lipid Panel     Component Value Date/Time   CHOL 106 01/08/2017 0533   TRIG 35 01/08/2017 0533   HDL 46 01/08/2017 0533   CHOLHDL 2.3 01/08/2017 0533   VLDL 7 01/08/2017 0533   LDLCALC 53 01/08/2017 0533     Lab Results  Component Value Date   HGBA1C 7.7 (H) 01/07/2017   HGBA1C 8.1 (H) 01/06/2017   HGBA1C 8.3 (H) 03/06/2016     Lab Results  Component Value Date   MICROALBUR 1.1 11/30/2015   LDLCALC 53 01/08/2017   CREATININE 0.73 01/08/2017     HPI   66 year old ambidextrous white male who presented with a one-week history of marked weakness involving the left upper and left lower extremity. The patient did not seek medical attention because he thought he was going to be improved. He also had significant dysarthria although this is noted more by his family and not the patient per say. He does endorse having some dizziness and visual obscuration/blurring of his vision. He denies diplopia. He denies headache but does have some chronic pain involving the hips and shoulders bilaterally. He is requesting pain medicines for his hip that is hurting. He does not report having palpitation or dyspnea although he reports having some chest discomfort on the left side. The patient was to be on 81 mg aspirin but has not taken this for a while. He apparently loss his primary care provider. He has had some abdominal discomfort with aspirin in general. He does not complain of headaches. The patient underwent successful left carotid endarterectomy a few years ago. The review systems otherwise negative.   HOSPITAL COURSE:   Acute to subacute CVA (cerebral vascular accident) [facility]) MRI of the brain shows . Small acute and subacute infarcts in the right cerebral hemisphere Carotid Doppler  right and left less than 50% MRA of the head Moderate to advanced atheromatous irregularity of the bilateral vertebral arteries, right PICA, and bilateral P2 segments Patient intolerant of aspirin, therefore switch to Plavix Lipitor increased to 40 mg a day, LDL 53 2-D echo.. 50%,  grade 2 diastolic dysfunction Neurology recommended 30 day Holter monitor to rule out atrial fibrillation      Type 2 diabetes mellitus  without neurological manifestations, uncontrolled (HCC)-continue Glucotrol,   hemoglobin A1c  pending      Essential hypertension, benign -continue Cozaar, Norvasc    History of CAROTID ENDARTERECTOMY, LEFT,  -carotid Doppler within normal limits    PAD, he follows with Dr Gwenlyn Found, last seen in 04/2016. He has not been taking aspirin for the past  several months  Insomnia-continue amitriptyline   Discharge Exam:   Blood pressure (!) 145/61, pulse 64, temperature 98.6 F (37 C), temperature source Oral, resp. rate 16, height _0  (1.778 m), weight 74 kg (163 lb 3.2 oz), SpO2 100 %.   Cardiovascular: Regular rate and rhythm, no murmurs / rubs / gallops. No extremity edema. 2+ pedal pulses. No carotid bruits.  Abdomen: no tenderness, no masses palpated. No hepatosplenomegaly. Bowel sounds positive.  Musculoskeletal: no clubbing / cyanosis. No joint deformity upper and lower extremities. Good ROM, no contractures. Normal muscle tone.  Skin: no rashes, lesions, ulcers. No induration Neurologic: CN 2-12 grossly intact     Signed: Zarius Furr 01/08/2017, 10:00 AM        Time spent >1 hour

## 2017-01-08 NOTE — Evaluation (Addendum)
Physical Therapy Evaluation Patient Details Name: Ryan Butler MRN: [id number] DOB: 1951-04-25 Today's Date: 01/08/2017   History of Present Illness  66 year old male with a history of diabetes mellitus type 2, hypertension, peripheral vascular disease, seen by PCP yesterday for slurred speech visual changes and weakness, that started 7 days ago. Patient also had left-sided upper and lower extremity weakness. Patient denied any headache, balance issues, nausea. Patient denied any chest pain palpitations or shortness of breath. Patient is an active smoker. He has had difficulty ambulating due to left leg pain. Patient has not taken aspirin in the last 4 days. Patient had MRI done by his PCP that showed Small acute and subacute infarcts in the right cerebral hemisphere, posterior watershed territory.  Clinical Impression  Patient near full recovery from CVA with most left sided weakness due to prior injuries, limited for gait mostly due to left hip pain, walking to bathroom with family/nursing supervision.  Patient will benefit from continued PT for functional mobility, BLE strengthening and gait traiing.    Follow Up Recommendations Outpatient PT    Equipment Recommendations  None recommended by PT    Recommendations for Other Services OT consult     Precautions / Restrictions Precautions Precautions: Fall Precaution Comments: Minor fall risk due to increasing left hip pain during ambulation, frequently leans on side rails to rest Restrictions Weight Bearing Restrictions: No      Mobility  Bed Mobility Overal bed mobility: Independent                Transfers Overall transfer level: Modified independent Equipment used: None Transfers: Sit to/from Stand Sit to Stand: Supervision         General transfer comment: required use of armrest of chair  Ambulation/Gait Ambulation/Gait assistance: Min guard Ambulation Distance (Feet): 100 Feet Assistive device: None Gait  Pattern/deviations: Decreased step length - left;Decreased stance time - left;Decreased weight shift to left;Antalgic   Gait velocity interpretation: Below normal speed for age/gender General Gait Details: Patient required frequent stopping/leaning on siderail/wall due to increasing left hip pain during ambulation  Stairs            Wheelchair Mobility    Modified Rankin (Stroke Patients Only) Modified Rankin (Stroke Patients Only) Pre-Morbid Rankin Score: Slight disability Modified Rankin: Slight disability     Balance Overall balance assessment: Needs assistance Sitting-balance support: Feet supported Sitting balance-Leahy Scale: Normal     Standing balance support: No upper extremity supported;During functional activity Standing balance-Leahy Scale: Fair                               Pertinent Vitals/Pain Pain Assessment: 0-10 Pain Score: 8  Faces Pain Scale: Hurts even more Pain Location: left hip and low back Pain Descriptors / Indicators: Nagging;Sharp;Grimacing Pain Intervention(s): Limited activity within patient's tolerance;Repositioned    Home Living Family/patient expects to be discharged to:: Private residence Living Arrangements: Non-relatives/Friends Available Help at Discharge: Family;Friend(s);Available 24 hours/day Type of Home: Mobile home Home Access: Stairs to enter Entrance Stairs-Rails: Left Entrance Stairs-Number of Steps: 3 Home Layout: One level Home Equipment: Walker - 2 wheels;Cane - single point      Prior Function Level of Independence: Independent               Hand Dominance   Dominant Hand: Right    Extremity/Trunk Assessment   Upper Extremity Assessment Upper Extremity Assessment: LUE deficits/detail;RUE deficits/detail RUE Deficits / Details: c/o mild  numbness bilateral UE to hands which is baseline for him due to prior injuries LUE Deficits / Details: mild weakness -4/5 close to baseline per patient,  patient c/o mild numbness bilateral UE to hands which is baseline for him due to prior injuries LUE Coordination: decreased fine motor    Lower Extremity Assessment Lower Extremity Assessment: Overall WFL for tasks assessed (LLE slightly weaker due mostly to left hip pain)    Cervical / Trunk Assessment Cervical / Trunk Assessment: Kyphotic  Communication   Communication: No difficulties  Cognition Arousal/Alertness: Awake/alert Behavior During Therapy: WFL for tasks assessed/performed Overall Cognitive Status: Within Functional Limits for tasks assessed                                        General Comments      Exercises     Assessment/Plan    PT Assessment Patient needs continued PT services  PT Problem List Decreased activity tolerance;Decreased balance;Decreased mobility;Pain;Impaired sensation       PT Treatment Interventions Gait training    PT Goals (Current goals can be found in the Care Plan section)  Acute Rehab PT Goals Patient Stated Goal: Return home with family to assist as needed PT Goal Formulation: With patient/family Time For Goal Achievement: 01/15/17 Potential to Achieve Goals: Good    Frequency Min 2X/week   Barriers to discharge        Co-evaluation               AM-PAC PT "6 Clicks" Daily Activity  Outcome Measure Difficulty turning over in bed (including adjusting bedclothes, sheets and blankets)?: None Difficulty moving from lying on back to sitting on the side of the bed? : None Difficulty sitting down on and standing up from a chair with arms (e.g., wheelchair, bedside commode, etc,.)?: A Little Help needed moving to and from a bed to chair (including a wheelchair)?: A Little Help needed walking in hospital room?: A Little Help needed climbing 3-5 steps with a railing? : A Little 6 Click Score: 20    End of Session Equipment Utilized During Treatment: Gait belt Activity Tolerance: Patient limited by  pain Patient left: in bed;with call bell/phone within reach;with family/visitor present Nurse Communication: Mobility status PT Visit Diagnosis: Other abnormalities of gait and mobility (R26.89)    Time: 1751-0258 PT Time Calculation (min) (ACUTE ONLY): 27 min   Charges:   PT Evaluation $PT Eval Low Complexity: 1 Low PT Treatments $Gait Training: 8-22 mins   PT G CodesLonell Grandchild, MPT 01/08/2017, 9:57 AM   2017/03/16 0846  PT G-Codes **NOT FOR INPATIENT CLASS**  Functional Assessment Tool Used AM-PAC 6 Clicks Basic Mobility  Functional Limitation Mobility: Walking and moving around  Mobility: Walking and Moving Around Current Status (N2778) CJ  Mobility: Walking and Moving Around Goal Status (E4235) CJ  Mobility: Walking and Moving Around Discharge Status (T6144) CJ   Addendum based on data from original visit.  8:50 AM, 03-16-2017 Lonell Grandchild, MPT Physical Therapist with [facility] [phone] office [phone] mobile phone

## 2017-01-08 NOTE — Progress Notes (Unsigned)
Pt is to wear a 3- day event monitor for CVA- per [facility] due to phone call from floor nurse as pt is being discharged. (Dr. Allyson Sabal)

## 2017-01-12 NOTE — Progress Notes (Signed)
HPI:   [name] is a 66 y.o. male, who is here today with his daughter to follow on recent hospitalization.   He was seen on 01/06/17, when he was with his son and c/o 2-3 days of focal weakness and numbness LUE. It seems like he has had similar episodes in the past but transient for the past 2-3 months, ? TIA. Hx of PAD,DM II,tobacco use,and HLD among some.  Stat brain MRI arranged as out pt and positive for subacute CVA.  He was admitted on 01/07/17 and discharged home on 01/08/17.    01/07/17:  CXR: Negative.  Brain MRI: 1. Small acute and subacute infarcts in the right cerebral hemisphere, posterior watershed territory. 2. Advanced intracranial atherosclerosis. 3. Advanced right cavernous ICA stenosis with flow gap. 4. Moderate to advanced atheromatous irregularity of the bilateral vertebral arteries, right PICA, and bilateral P2 segments.  Carotid US: 1. Mild right carotid bifurcation proximal ICA atherosclerotic vascular disease. No flow limiting stenosis. Degree stenosis less than 50%.   2. Mild left carotid bifurcation atherosclerotic vascular disease. No flow limiting stenosis. Degree of stenosis less than 50%.   3.  Vertebrals are patent antegrade flow.  Lab Results  Component Value Date   WBC 4.1 01/08/2017   HGB 12.4 (L) 01/08/2017   HCT 36.0 (L) 01/08/2017   MCV 90.9 01/08/2017   PLT 122 (L) 01/08/2017   Echo 12/2016:   - Left ventricle: The cavity size was normal. Wall thickness was increased in a pattern of mild LVH. Systolic function was at the   lower limits of normal. The estimated ejection fraction was 50%.Wall motion was normal; there were no regional wall motion abnormalities. Features are consistent with a pseudonormal left ventricular filling pattern, with concomitant abnormal relaxation and increased filling pressure (grade 2 diastolic dysfunction). - Aortic valve: Sclerosis without stenosis. - Mitral valve: There was mild  regurgitation. - Atrial septum: No defect or patent foramen ovale was identified. - Tricuspid valve: There was mild regurgitation  He saw Dr Merlene Laughter, neuro. F/U in 2 months.  He was discharged on Tramadol 50 mg qid and Plavix 75 mg daily.   HTN: Continue Amlodipine 5 mg and Cozaar 25 mg daily. He is not checking BP at home. "Little dizzy" when he gets up fast.  He is wearing an event monitor and has an appt with cardiologists in 01/2017. Denies severe/frequent headache, visual changes, chest pain, dyspnea, palpitation, claudication, or edema.  No changes in Glipizide 5 mg. Skips meals frequently, reporting a BS low in the mid 50's.   Lab Results  Component Value Date   HGBA1C 7.6 (H) 01/08/2017   Lab Results  Component Value Date   MICROALBUR 1.1 11/30/2015   BS 142 today. He does not remember taking Metformin before.  HH starts with PT and OT tomorrow.  According to daughter, strength of LUE is 90% improved. Numbness and tingling on upper extremities at his baseline. No significant residual deficit.  Insomnia: He is taking Elavir 150 mg at bedtime, still helping sleep.  He denies side effects.  Still smoking, has cut back but not planning on quitting.  No new concerns today.    Review of Systems  Constitutional: Positive for fatigue. Negative for appetite change, fever and unexpected weight change.  HENT: Negative for mouth sores, nosebleeds, sore throat and trouble swallowing.   Eyes: Negative for redness and visual disturbance.  Respiratory: Negative for cough, shortness of breath and wheezing.   Cardiovascular:  Negative for chest pain, palpitations and leg swelling.  Gastrointestinal: Negative for abdominal pain, nausea and vomiting.       No changes in bowel habits.  Endocrine: Negative for polydipsia, polyphagia and polyuria.  Genitourinary: Negative for decreased urine volume, dysuria and hematuria.  Musculoskeletal: Positive for arthralgias and gait  problem.  Skin: Negative for rash and wound.  Neurological: Positive for dizziness and numbness. Negative for seizures, syncope and headaches.  Psychiatric/Behavioral: Negative for confusion and hallucinations.     Current Outpatient Prescriptions on File Prior to Visit  Medication Sig Dispense Refill  . amitriptyline (ELAVIL) 150 MG tablet TAKE ONE TABLET BY MOUTH AT BEDTIME AS NEEDED FOR SLEEP 90 tablet 1  . amLODipine (NORVASC) 5 MG tablet Take 1 tablet (5 mg total) by mouth daily. 90 tablet 1  . atorvastatin (LIPITOR) 40 MG tablet Take 1 tablet (40 mg total) by mouth daily at 6 PM. 30 tablet 1  . clopidogrel (PLAVIX) 75 MG tablet Take 1 tablet (75 mg total) by mouth daily with breakfast. 30 tablet 11  . glipiZIDE (GLUCOTROL) 5 MG tablet Take 1 tablet (5 mg total) by mouth daily before breakfast. 60 tablet 3  . losartan (COZAAR) 25 MG tablet Take 1 tablet (25 mg total) by mouth daily. 90 tablet 1  . traMADol (ULTRAM) 50 MG tablet Take 1 tablet (50 mg total) by mouth every 6 (six) hours as needed (Pain). 30 tablet 1   No current facility-administered medications on file prior to visit.      Past Medical History:  Diagnosis Date  . Carotid artery disease ([place])   . Cervical spondylosis   . Closed head injury 1996  . Essential hypertension, benign   . History of colonic polyps   . History of scarlet fever   . History of seizures   . History of TIAs   . Polysubstance abuse    History of cocaine in his 29's  . Type 2 diabetes mellitus ([place])    Pt stopped metformin 09/2013   Allergies  Allergen Reactions  . Aspirin Nausea And Vomiting  . Hydrocodone Nausea Only    Social History   Social History  . Marital status: Divorced    Spouse name: N/A  . Number of children: N/A  . Years of education: N/A   Social History Main Topics  . Smoking status: Current Every Day Smoker    Packs/day: 1.00    Years: 55.00    Types: Cigarettes  . Smokeless tobacco: Never Used      Comment: smokes 1/2 pack per day now; reports that he starting to quick  . Alcohol use No     Comment: drinks occasional reports former drinker  . Drug use: Yes    Types: Marijuana, Oxycodone  . Sexual activity: Not Asked   Other Topics Concern  . None   Social History Narrative  . None    Vitals:   01/13/17 1349 01/13/17 1439  BP: (!) 160/92 (!) 145/70  Pulse: 83   Resp: 16    Body mass index is 22.24 kg/m.   Physical Exam  Nursing note and vitals reviewed. Constitutional: He is oriented to person, place, and time. He appears well-developed and well-nourished. No distress.  HENT:  Head: Normocephalic and atraumatic.  Mouth/Throat: Oropharynx is clear and moist and mucous membranes are normal. He has dentures.  Eyes: Pupils are equal, round, and reactive to light. Conjunctivae are normal.  Cardiovascular: Normal rate.  An irregular rhythm present.  No  murmur heard. Pulses:      Dorsalis pedis pulses are 2+ on the right side, and 2+ on the left side.  Respiratory: Effort normal and breath sounds normal. No respiratory distress.  GI: Soft. He exhibits no mass. There is no hepatomegaly. There is no tenderness.  Musculoskeletal: He exhibits edema (Trace pitting edema LE, bilateral.).  Left hip pain with ROM, limited. Antalgic gait.  Lymphadenopathy:    He has no cervical adenopathy.  Neurological: He is alert and oriented to person, place, and time.  No focal deficit appreciated. Stable gait with no assistance.  Skin: Skin is warm. No rash noted. No erythema.  Psychiatric: He has a normal mood and affect.  Fairly groomed, poor eye contact.   Diabetic Foot Exam - Simple   Simple Foot Form Diabetic Foot exam was performed with the following findings:  Yes 01/13/2017  5:08 PM  Visual Inspection See comments:  Yes Sensation Testing See comments:  Yes Pulse Check Posterior Tibialis and Dorsalis pulse intact bilaterally:  Yes Comments Calluses and decreased monofilament  bilateral.       ASSESSMENT AND PLAN:   [name] was seen today for hospitalization follow-up.  Diagnoses and all orders for this visit:    Chemistry      Component Value Date/Time   NA 136 01/13/2017 1445   K 4.3 01/13/2017 1445   CL 95 (L) 01/13/2017 1445   CO2 33 (H) 01/13/2017 1445   BUN 20 01/13/2017 1445   CREATININE 0.98 01/13/2017 1445      Component Value Date/Time   CALCIUM 10.1 01/13/2017 1445   ALKPHOS 121 (H) 01/13/2017 1445   AST 33 01/13/2017 1445   ALT 47 01/13/2017 1445   BILITOT 0.9 01/13/2017 1445     Lab Results  Component Value Date   WBC 8.1 01/13/2017   HGB 15.9 01/13/2017   HCT 47.8 01/13/2017   MCV 94.4 01/13/2017   PLT 165.0 01/13/2017    Cerebrovascular accident (CVA) due to thrombosis of right vertebral artery ([place])  With minimal residual weakness. PT and OT ready to start tomorrow. Appt with neuro in 2 months. Continue Plavix, Lipitor. Smoking cessation encouraged. Instructed about warning signs.  -     Comprehensive metabolic panel -     CBC  Uncontrolled type 2 diabetes mellitus with diabetic neuropathy, without long-term current use of insulin ([place])  He agrees with trying Metformin 500 mg bid, side effects discussed. For now no changes in Glipizide but if he gets hypoglycemia again we need to discontinue it. Avoid skipping meals. He is overdue for eye exam. Foot care discussed. F/U in 3 months.   -     metFORMIN (GLUCOPHAGE-XR) 500 MG 24 hr tablet; Take 1 tablet (500 mg total) by mouth daily with breakfast. -     Microalbumin / creatinine urine ratio  Essential hypertension, benign  BP elevated today. No changes in current management for now. Low salt diet recommended. Monitor BP at home. He will keep appt with cardio 02/16/17.  Insomnia, unspecified type  Elavil 150 mg helping. We discussed some side effects. For now no changes. Good sleep hygiene. F/U in 3 months.  Polyarthralgia  Generalized  OA. Fall precautions discussed as well as side effects of Tramadol. He understands this is a temporal Rx, he has refused referral to pain clinic in the past.  PT may help.  PVD (peripheral vascular disease) ([place])  Strongly recommend smoking cessation. Continue Plavix and Lipitor. Keep appt with cardiologists.  Sasha Rueth G. [place], MD  [facility]. [place] office.

## 2017-01-13 ENCOUNTER — Encounter: Payer: Self-pay | Admitting: Family Medicine

## 2017-01-13 ENCOUNTER — Ambulatory Visit (INDEPENDENT_AMBULATORY_CARE_PROVIDER_SITE_OTHER): Payer: Medicare Other | Admitting: Family Medicine

## 2017-01-13 VITALS — BP 145/70 | HR 83 | Resp 16 | Ht 70.0 in | Wt 155.0 lb

## 2017-01-13 DIAGNOSIS — E1140 Type 2 diabetes mellitus with diabetic neuropathy, unspecified: Secondary | ICD-10-CM

## 2017-01-13 DIAGNOSIS — I63011 Cerebral infarction due to thrombosis of right vertebral artery: Secondary | ICD-10-CM

## 2017-01-13 DIAGNOSIS — I10 Essential (primary) hypertension: Secondary | ICD-10-CM | POA: Diagnosis not present

## 2017-01-13 DIAGNOSIS — G5622 Lesion of ulnar nerve, left upper limb: Secondary | ICD-10-CM | POA: Diagnosis not present

## 2017-01-13 DIAGNOSIS — I69322 Dysarthria following cerebral infarction: Secondary | ICD-10-CM | POA: Diagnosis not present

## 2017-01-13 DIAGNOSIS — IMO0002 Reserved for concepts with insufficient information to code with codable children: Secondary | ICD-10-CM

## 2017-01-13 DIAGNOSIS — I739 Peripheral vascular disease, unspecified: Secondary | ICD-10-CM | POA: Diagnosis not present

## 2017-01-13 DIAGNOSIS — G4700 Insomnia, unspecified: Secondary | ICD-10-CM

## 2017-01-13 DIAGNOSIS — M545 Low back pain: Secondary | ICD-10-CM | POA: Diagnosis not present

## 2017-01-13 DIAGNOSIS — M2550 Pain in unspecified joint: Secondary | ICD-10-CM | POA: Diagnosis not present

## 2017-01-13 DIAGNOSIS — I63311 Cerebral infarction due to thrombosis of right middle cerebral artery: Secondary | ICD-10-CM | POA: Diagnosis not present

## 2017-01-13 DIAGNOSIS — E1165 Type 2 diabetes mellitus with hyperglycemia: Secondary | ICD-10-CM | POA: Diagnosis not present

## 2017-01-13 DIAGNOSIS — I69393 Ataxia following cerebral infarction: Secondary | ICD-10-CM | POA: Diagnosis not present

## 2017-01-13 LAB — COMPREHENSIVE METABOLIC PANEL
ALBUMIN: 4.6 g/dL (ref 3.5–5.2)
ALT: 47 U/L (ref 0–53)
AST: 33 U/L (ref 0–37)
Alkaline Phosphatase: 121 U/L — ABNORMAL HIGH (ref 39–117)
BILIRUBIN TOTAL: 0.9 mg/dL (ref 0.2–1.2)
BUN: 20 mg/dL (ref 6–23)
CALCIUM: 10.1 mg/dL (ref 8.4–10.5)
CHLORIDE: 95 meq/L — AB (ref 96–112)
CO2: 33 meq/L — AB (ref 19–32)
CREATININE: 0.98 mg/dL (ref 0.40–1.50)
GFR: 80.72 mL/min (ref >60.00)
Glucose, Bld: 274 mg/dL — ABNORMAL HIGH (ref 70–99)
Potassium: 4.3 mEq/L (ref 3.5–5.1)
Sodium: 136 mEq/L (ref 135–145)
Total Protein: 8.1 g/dL (ref 6.0–8.3)

## 2017-01-13 LAB — CBC
HEMATOCRIT: 47.8 % (ref 39.0–52.0)
Hemoglobin: 15.9 g/dL (ref 13.0–17.0)
MCHC: 33.3 g/dL (ref 30.0–36.0)
MCV: 94.4 fl (ref 78.0–100.0)
PLATELETS: 165 10*3/uL (ref 150.0–400.0)
RBC: 5.06 Mil/uL (ref 4.22–5.81)
RDW: 14.1 % (ref 11.5–15.5)
WBC: 8.1 10*3/uL (ref 4.0–10.5)

## 2017-01-13 MED ORDER — METFORMIN HCL ER 500 MG PO TB24: 500.0000 mg | ORAL_TABLET | Freq: Every day | ORAL | 2 refills | Status: DC

## 2017-01-13 NOTE — Patient Instructions (Addendum)
A few things to remember from today's visit:   Uncontrolled type 2 diabetes mellitus with diabetic neuropathy, without long-term current use of insulin ([place]) - Plan: metFORMIN (GLUCOPHAGE-XR) 500 MG 24 hr tablet  Essential hypertension, benign  Cerebrovascular accident (CVA) due to thrombosis of right vertebral artery ([place]) - Plan: Comprehensive metabolic panel, CBC  Insomnia, unspecified type  HgA1C goal < 7.0. Avoid sugar added food:regular soft drinks, energy drinks, and sports drinks. candy. cakes. cookies. pies and cobblers. sweet rolls, pastries, and donuts. fruit drinks, such as fruitades and fruit punch. dairy desserts, such as ice cream  Mediterranean diet has showed benefits for sugar control.  How much and what type of carbohydrate foods are important for managing diabetes. The balance between how much insulin is in your body and the carbohydrate you eat makes a difference in your blood glucose levels.  Fasting blood sugar ideally 130 or less, 2 hours after meals less than 180.   Regular exercise also will help with controlling disease, daily brisk walking as tolerated for 15-30 min definitively will help.   Avoid skipping meals, blood sugar might drop and cause serious problems. Remember checking feet periodically, good dental hygiene, and annual eye exam.     Hypoglycemia Hypoglycemia is when the sugar (glucose) level in the blood is too low. Symptoms of low blood sugar may include:  Feeling: ? Hungry. ? Worried or nervous (anxious). ? Sweaty and clammy. ? Confused. ? Dizzy. ? Sleepy. ? Sick to your stomach (nauseous).  Having: ? A fast heartbeat. ? A headache. ? A change in your vision. ? Jerky movements that you cannot control (seizure). ? Nightmares. ? Tingling or no feeling (numbness) around the mouth, lips, or tongue.  Having trouble with: ? Talking. ? Paying attention (concentrating). ? Moving (coordination). ? Sleeping.  Shaking.  Passing  out (fainting).  Getting upset easily (irritability).  Low blood sugar can happen to people who have diabetes and people who do not have diabetes. Low blood sugar can happen quickly, and it can be an emergency. Treating Low Blood Sugar Low blood sugar is often treated by eating or drinking something sugary right away. If you can think clearly and swallow safely, follow the 15:15 rule:  Take 15 grams of a fast-acting carb (carbohydrate). Some fast-acting carbs are: ? 1 tube of glucose gel. ? 3 sugar tablets (glucose pills). ? 6-8 pieces of hard candy. ? 4 oz (120 mL) of fruit juice. ? 4 oz (120 mL) of regular (not diet) soda.  Check your blood sugar 15 minutes after you take the carb.  If your blood sugar is still at or below 70 mg/dL (3.9 mmol/L), take 15 grams of a carb again.  If your blood sugar does not go above 70 mg/dL (3.9 mmol/L) after 3 tries, get help right away.  After your blood sugar goes back to normal, eat a meal or a snack within 1 hour.  Treating Very Low Blood Sugar If your blood sugar is at or below 54 mg/dL (3 mmol/L), you have very low blood sugar (severe hypoglycemia). This is an emergency. Do not wait to see if the symptoms will go away. Get medical help right away. Call your local emergency services (911 in the [place] Do not drive yourself to the hospital. If you have very low blood sugar and you cannot eat or drink, you may need a glucagon shot (injection). A family member or friend should learn how to check your blood sugar and how to give  you a glucagon shot. Ask your doctor if you need to have a glucagon shot kit at home. Follow these instructions at home: General instructions  Avoid any diets that cause you to not eat enough food. Talk with your doctor before you start any new diet.  Take over-the-counter and prescription medicines only as told by your doctor.  Limit alcohol to no more than 1 drink per day for nonpregnant women and 2 drinks per day for  men. One drink equals 12 oz of beer, 5 oz of wine, or 1 oz of hard liquor.  Keep all follow-up visits as told by your doctor. This is important. If You Have Diabetes:   Make sure you know the symptoms of low blood sugar.  Always keep a source of sugar with you, such as: ? Sugar. ? Sugar tablets. ? Glucose gel. ? Fruit juice. ? Regular soda (not diet soda). ? Milk. ? Hard candy. ? Honey.  Take your medicines as told.  Follow your exercise and meal plan. ? Eat on time. Do not skip meals. ? Follow your sick day plan when you cannot eat or drink normally. Make this plan ahead of time with your doctor.  Check your blood sugar as often as told by your doctor. Always check before and after exercise.  Share your diabetes care plan with: ? Your work or school. ? People you live with.  Check your pee (urine) for ketones: ? When you are sick. ? As told by your doctor.  Carry a card or wear jewelry that says you have diabetes. If You Have Low Blood Sugar From Other Causes:   Check your blood sugar as often as told by your doctor.  Follow instructions from your doctor about what you cannot eat or drink. Contact a doctor if:  You have trouble keeping your blood sugar in your target range.  You have low blood sugar often. Get help right away if:  You still have symptoms after you eat or drink something sugary.  Your blood sugar is at or below 54 mg/dL (3 mmol/L).  You have jerky movements that you cannot control.  You pass out. These symptoms may be an emergency. Do not wait to see if the symptoms will go away. Get medical help right away. Call your local emergency services (911 in the [place] Do not drive yourself to the hospital. This information is not intended to replace advice given to you by your health care provider. Make sure you discuss any questions you have with your health care provider. Document Released: 08/06/2009 Document Revised: 10/18/2015 Document  Reviewed: 06/15/2015 Elsevier Interactive Patient Education  2018 [employer].  Please be sure medication list is accurate. If a new problem present, please set up appointment sooner than planned today.

## 2017-01-14 ENCOUNTER — Encounter (HOSPITAL_COMMUNITY): Payer: Self-pay

## 2017-01-14 ENCOUNTER — Ambulatory Visit (HOSPITAL_COMMUNITY): Payer: Medicare Other | Attending: Internal Medicine | Admitting: Physical Therapy

## 2017-01-14 ENCOUNTER — Encounter (HOSPITAL_COMMUNITY): Payer: Self-pay | Admitting: Physical Therapy

## 2017-01-14 ENCOUNTER — Ambulatory Visit (HOSPITAL_COMMUNITY): Payer: Medicare Other

## 2017-01-14 DIAGNOSIS — R262 Difficulty in walking, not elsewhere classified: Secondary | ICD-10-CM | POA: Insufficient documentation

## 2017-01-14 DIAGNOSIS — R29898 Other symptoms and signs involving the musculoskeletal system: Secondary | ICD-10-CM | POA: Diagnosis not present

## 2017-01-14 DIAGNOSIS — M6281 Muscle weakness (generalized): Secondary | ICD-10-CM | POA: Diagnosis not present

## 2017-01-14 NOTE — Therapy (Signed)
[place], [place], [address] Phone: [phone]   Fax:  [phone]  Physical Therapy Evaluation  Patient Details  Name: Ryan Butler MRN: [id number] Date of Birth: 11/28/1950 Referring Provider: Reyne Dumas   Encounter Date: 01/14/2017      PT End of Session - 01/14/17 1618    Visit Number 1   Number of Visits 1   Authorization Type [employer] Medicare    Authorization Time Period 01/14/17 to 01/14/17   Authorization - Visit Number 1   Authorization - Number of Visits 10   PT Start Time 1350   PT Stop Time 1415   PT Time Calculation (min) 25 min   Activity Tolerance Patient tolerated treatment well   Behavior During Therapy [facility] for tasks assessed/performed      Past Medical History:  Diagnosis Date  . Carotid artery disease ([place])   . Cervical spondylosis   . Closed head injury 1996  . Essential hypertension, benign   . History of colonic polyps   . History of scarlet fever   . History of seizures   . History of TIAs   . Polysubstance abuse    History of cocaine in his 28's  . Type 2 diabetes mellitus ([place])    Pt stopped metformin 09/2013    Past Surgical History:  Procedure Laterality Date  . ANTERIOR CERVICAL DECOMP/DISCECTOMY FUSION N/A 12/05/2013   Procedure: ANTERIOR CERVICAL DECOMPRESSION/DISCECTOMY FUSION 2 LEVELS;  Surgeon: Elaina Hoops, MD;  Location: [place];  Service: Neurosurgery;  Laterality: N/A;  Anterior Cervical Discectomy and Fusion with PEEK Cages and allograft Cervical Four-Five/Five-Six  . FRACTURE SURGERY    . Hand laceration repair    . Left carotid endarterectomy     6/06 - Dr. Scot Dock  . skin graft to leg Left   . Tibia/fibula fracture repair    . VASCULAR SURGERY      There were no vitals filed for this visit.       Subjective Assessment - 01/14/17 1352    Subjective patient arrives stating that he had a stroke this month; in terms of his balance and walking he  feels fine unless he gets going too quick. He loves riding his motorcycle. He states that he has a lot of trouble with his low back and in his hips but this has been going on for some time. No falls recently, he has had some close calls and stumbles.    Pertinent History HTN, history of CVA August 2018, DM, spinal stenosis, chronic pain disorder, hx closed head injury (1996), hx of seizures (medically resolved), ACDF, history of LE fracture    Patient Stated Goals get better    Currently in Pain? Yes   Pain Score 7   no signs of distress correlating with this high pain rating    Pain Location Back   Pain Orientation Left   Pain Descriptors / Indicators Shooting   Pain Type Chronic pain   Pain Radiating Towards all the way to the L foot    Pain Onset More than a month ago   Pain Frequency Constant   Aggravating Factors  nothing    Pain Relieving Factors pain medicines    Effect of Pain on Daily Activities moderate             [facility] PT Assessment - 01/14/17 0001      Assessment   Medical Diagnosis CVA    Referring Provider Ascencion Dike  Abrol    Onset Date/Surgical Date --  August 2018   Next MD Visit PCP in 3 months    Prior Therapy none for this problem      Precautions   Precautions Fall     Balance Screen   Has the patient fallen in the past 6 months No   Has the patient had a decrease in activity level because of a fear of falling?  No   Is the patient reluctant to leave their home because of a fear of falling?  No     Prior Function   Level of Independence Independent;Independent with basic ADLs;Independent with gait;Independent with transfers   Vocation Retired   Leisure riding motorcycles, fishing, Neurosurgeon   Right Hip Flexion 5/5   Right Hip ABduction 5/5   Left Hip Flexion 5/5   Left Hip ABduction 4+/5   Right Knee Extension 5/5   Left Knee Extension 5/5   Right Ankle Dorsiflexion 5/5   Left Ankle Dorsiflexion 5/5     6 minute walk test results     Aerobic Endurance Distance Walked 693   Endurance additional comments 3MWT no device      Standardized Balance Assessment   Standardized Balance Assessment Dynamic Gait Index     Dynamic Gait Index   Level Surface Normal   Change in Gait Speed Normal   Gait with Horizontal Head Turns Mild Impairment   Gait with Vertical Head Turns Mild Impairment   Gait and Pivot Turn Mild Impairment   Step Over Obstacle Normal   Step Around Obstacles Normal   Steps Normal   Total Score 21   DGI comment: 87.5%            Objective measurements completed on examination: See above findings.                  PT Education - 01/14/17 1617    Education provided Yes   Education Details exam findings, POC, no skilled PT services needed as patient seems to be at his baseline, offered opportuity to review exercise equipment, patient may receive PT intervention for his hips/back with new MD order if he is interested    Person(s) Educated Patient   Methods Explanation   Comprehension Verbalized understanding          PT Short Term Goals - 01/14/17 1621      PT SHORT TERM GOAL #1   Title No STGs appropriate, one time visit only            PT Long Term Goals - 01/14/17 1621      PT LONG TERM GOAL #1   Title No LTGs appropriate, one time visit only                 Plan - 01/14/17 1619    Clinical Impression Statement Patient arrives after sustaining acute CVA on August 15th, 2018; he reports that at this point he is really feeling generally at his baseline in terms of walking, balance, and functional task performance/tolerance. He does report severe low back and hip pain however this has been chronic and is nothing new since the CVA. Note that MD PT order only indicates CVA; educated patient that we may address hip/back issues with new MD order if patient is interested however. Examination reveals impairments consistent with chronic lumbar and hip impairments, otherwise  patient appears to generally be at his baseline with gait mechanics, functional strength per  MMT testing, and DGI scoring. Patient is not in need of skilled PT services at this time- one time visit only.    History and Personal Factors relevant to plan of care: history of chronic pain in low back and hips, closed head injury, LE fracture, acute CVA August 2018   Clinical Presentation Stable   Clinical Presentation due to: chronic pain, biomechanics, minimal influence from acute CVA    Clinical Decision Making Low   Rehab Potential Excellent   Clinical Impairments Affecting Rehab Potential patient at functional baseline, no skilled PT services indicated    PT Frequency One time visit   PT Duration Other (comment)  one time visit    PT Treatment/Interventions ADLs/Self Care Home Management   PT Next Visit Plan one time visit, no further skilled PT services required    PT Home Exercise Plan regular exercise routine    Recommended Other Services discussed [facility] course to assist in smoking cessation    Consulted and Agree with Plan of Care Patient      Patient will benefit from skilled therapeutic intervention in order to improve the following deficits and impairments:  Abnormal gait, Improper body mechanics, Pain, Decreased strength, Decreased balance, Difficulty walking  Visit Diagnosis: Muscle weakness (generalized) - Plan: PT plan of care cert/re-cert  Difficulty in walking, not elsewhere classified - Plan: PT plan of care cert/re-cert      G-Codes - 62/56/38 1622    Functional Assessment Tool Used (Outpatient Only) Based on skilled clinical assessment of strength, gait pattern, pain patterns, balance, fall risk    Functional Limitation Mobility: Walking and moving around   Mobility: Walking and Moving Around Current Status ([id number]) At least 1 percent but less than 20 percent impaired, limited or restricted   Mobility: Walking and Moving Around Goal Status [phone]) At least 1 percent but  less than 20 percent impaired, limited or restricted   Mobility: Walking and Moving Around Discharge Status [phone]) At least 1 percent but less than 20 percent impaired, limited or restricted       Problem List Patient Active Problem List   Diagnosis Date Noted  . Polyarthralgia 01/13/2017  . CVA (cerebral vascular accident) ([place]) 01/07/2017  . PVD (peripheral vascular disease) ([place]) 11/30/2015  . Insomnia 11/30/2015  . Chronic pain disorder 11/30/2015  . Spinal stenosis of lumbar region 04/25/2015  . Spinal stenosis in cervical region 12/05/2013  . Precordial pain 09/17/2011  . TOBACCO ABUSE 03/26/2006  . Headache, unspecified headache type 03/26/2006  . Type 2 diabetes mellitus with neurological manifestations, uncontrolled ([place]) 03/24/2006  . Mixed hyperlipidemia 03/24/2006  . Essential hypertension, benign 03/24/2006  . CAROTID ENDARTERECTOMY, LEFT, HX OF 03/04/2005    Deniece Ree PT, DPT [facility] [address] [place], [place], [address] Phone: [phone]   Fax:  [phone]  Name: Ryan Butler MRN: [id number] Date of Birth: [birth year]

## 2017-01-14 NOTE — Therapy (Signed)
[facility], [place], [address] Phone: [phone]   Fax:  [phone]  Patient Details  Name: Ryan Butler MRN: [id number] Date of Birth: [birth year] Referring Provider:  Reyne Dumas, MD  Encounter Date: 01/14/2017  Patient presents to outpatient OT after sustaining a small acute and subacute infarct in the right cerebral hemisphere, posterior watershed territory around 01/08/17. Pt was admitted to [facility] and seen by the acute OT who recommended follow up in outpatient rehab for deficits including LUE weakness and fine motor coordination. During today's session, patient reports no issues with any activities and feels 100% back to normal . Patient's BUE strength was assessed and found to be 5/5 in all ranges, 9 hold peg test showed normal fine motor coordination skills (Right: 16.3 seconds. Left: 23.7 seconds.), grip strength and pinch strength were at baseline for patient ( right grip: 45#, Left grip: 47#, 3 point pinch R: 12#, L: 12#, lateral pinch: R: 11#, L: 12#. Reviewed findings with patient and recommended no follow up OT services. Pt in agreement. OT screen completed this date only.     Ailene Ravel, OTR/L,CBIS  [phone]  01/14/2017, 3:03 PM  [facility] [address] [place], [place], [address] Phone: [phone]   Fax:  [phone]

## 2017-02-16 ENCOUNTER — Ambulatory Visit (INDEPENDENT_AMBULATORY_CARE_PROVIDER_SITE_OTHER): Payer: Medicare Other | Admitting: Cardiovascular Disease

## 2017-02-16 ENCOUNTER — Encounter: Payer: Self-pay | Admitting: Cardiovascular Disease

## 2017-02-16 VITALS — BP 158/70 | HR 87 | Ht 70.0 in | Wt 154.0 lb

## 2017-02-16 DIAGNOSIS — Z9889 Other specified postprocedural states: Secondary | ICD-10-CM | POA: Diagnosis not present

## 2017-02-16 DIAGNOSIS — I519 Heart disease, unspecified: Secondary | ICD-10-CM | POA: Diagnosis not present

## 2017-02-16 DIAGNOSIS — I779 Disorder of arteries and arterioles, unspecified: Secondary | ICD-10-CM | POA: Diagnosis not present

## 2017-02-16 DIAGNOSIS — E782 Mixed hyperlipidemia: Secondary | ICD-10-CM

## 2017-02-16 DIAGNOSIS — I639 Cerebral infarction, unspecified: Secondary | ICD-10-CM

## 2017-02-16 DIAGNOSIS — I739 Peripheral vascular disease, unspecified: Secondary | ICD-10-CM

## 2017-02-16 DIAGNOSIS — I10 Essential (primary) hypertension: Secondary | ICD-10-CM

## 2017-02-16 DIAGNOSIS — Z9289 Personal history of other medical treatment: Secondary | ICD-10-CM

## 2017-02-16 DIAGNOSIS — I5189 Other ill-defined heart diseases: Secondary | ICD-10-CM

## 2017-02-16 NOTE — Progress Notes (Signed)
SUBJECTIVE: The patient is a 66 year old male who was recently hospitalized for acute CVA involving the right cerebral hemisphere. He is intolerant of aspirin and was switched to Plavix. Statin dose was increased. He was evaluated by neurology who recommended a 30 day Holter monitor to rule out atrial fibrillation.  Echocardiogram 01/08/17: Low normal left ventricular systolic function, LVEF 29%, grade 2 diastolic dysfunction with elevated filling pressures, mild mitral and tricuspid regurgitation.  Event monitoring demonstrated sinus rhythm with an average heart rate is 74 bpm. Occasional PACs were noted with a brief episode of atrial bigeminy. There were no sustained arrhythmias or pauses.  He has a history of hyperlipidemia, hypertension, left carotid endarterectomy, and tobacco abuse.  Carotid Dopplers 01/07/17: Less than 50% bilateral internal carotid artery stenosis.  He is here with his daughter.  He is scheduled to see his new PCP, Dr. Nevada Crane, tomorrow.  He denies chest pain and shortness of breath. He seldom has palpitations.   Review of Systems: As per "subjective", otherwise negative.  Allergies  Allergen Reactions  . Aspirin Nausea And Vomiting  . Hydrocodone Nausea Only    Current Outpatient Prescriptions  Medication Sig Dispense Refill  . amitriptyline (ELAVIL) 150 MG tablet TAKE ONE TABLET BY MOUTH AT BEDTIME AS NEEDED FOR SLEEP 90 tablet 1  . amLODipine (NORVASC) 5 MG tablet Take 1 tablet (5 mg total) by mouth daily. 90 tablet 1  . atorvastatin (LIPITOR) 40 MG tablet Take 1 tablet (40 mg total) by mouth daily at 6 PM. 30 tablet 1  . clopidogrel (PLAVIX) 75 MG tablet Take 1 tablet (75 mg total) by mouth daily with breakfast. 30 tablet 11  . glipiZIDE (GLUCOTROL) 5 MG tablet Take 1 tablet (5 mg total) by mouth daily before breakfast. 60 tablet 3  . losartan (COZAAR) 25 MG tablet Take 1 tablet (25 mg total) by mouth daily. 90 tablet 1  . metFORMIN (GLUCOPHAGE-XR)  500 MG 24 hr tablet Take 1 tablet (500 mg total) by mouth daily with breakfast. 60 tablet 2  . traMADol (ULTRAM) 50 MG tablet Take 1 tablet (50 mg total) by mouth every 6 (six) hours as needed (Pain). 30 tablet 1   No current facility-administered medications for this visit.     Past Medical History:  Diagnosis Date  . Carotid artery disease ([place])   . Cervical spondylosis   . Closed head injury 1996  . Essential hypertension, benign   . History of colonic polyps   . History of scarlet fever   . History of seizures   . History of TIAs   . Polysubstance abuse    History of cocaine in his 27's  . Type 2 diabetes mellitus ([place])    Pt stopped metformin 09/2013    Past Surgical History:  Procedure Laterality Date  . ANTERIOR CERVICAL DECOMP/DISCECTOMY FUSION N/A 12/05/2013   Procedure: ANTERIOR CERVICAL DECOMPRESSION/DISCECTOMY FUSION 2 LEVELS;  Surgeon: Elaina Hoops, MD;  Location: [facility];  Service: Neurosurgery;  Laterality: N/A;  Anterior Cervical Discectomy and Fusion with PEEK Cages and allograft Cervical Four-Five/Five-Six  . FRACTURE SURGERY    . Hand laceration repair    . Left carotid endarterectomy     6/06 - Dr. Scot Dock  . skin graft to leg Left   . Tibia/fibula fracture repair    . VASCULAR SURGERY      Social History   Social History  . Marital status: Divorced    Spouse name: N/A  . Number  of children: N/A  . Years of education: N/A   Occupational History  . Not on file.   Social History Main Topics  . Smoking status: Current Every Day Smoker    Packs/day: 1.00    Years: 55.00    Types: Cigarettes  . Smokeless tobacco: Never Used     Comment: smokes 1/2 pack per day now; reports that he starting to quick  . Alcohol use No     Comment: drinks occasional reports former drinker  . Drug use: Yes    Types: Marijuana, Oxycodone  . Sexual activity: Not on file   Other Topics Concern  . Not on file   Social History Narrative  . No narrative on file      Vitals:   02/16/17 1500 02/16/17 1503  BP: (!) 152/76 (!) 158/70  Pulse: 87   SpO2: 98%   Weight: 154 lb (69.9 kg)   Height: $Remove'5\' 10"'aBSGSKF$  (1.778 m)     Wt Readings from Last 3 Encounters:  02/16/17 154 lb (69.9 kg)  01/13/17 155 lb (70.3 kg)  01/07/17 163 lb 3.2 oz (74 kg)     PHYSICAL EXAM General: NAD HEENT: Normal. Neck: No JVD, no thyromegaly. Lungs: Clear to auscultation bilaterally with normal respiratory effort. CV: Nondisplaced PMI.  Regular rate and rhythm, normal S1/S2, no S3/S4, no murmur. No pretibial or periankle edema.  No carotid bruit.   Abdomen: Soft, nontender, no distention.  Neurologic: Alert and oriented.  Psych: Normal affect. Skin: Normal. Musculoskeletal: No gross deformities.    ECG: Most recent ECG reviewed.   Labs: Lab Results  Component Value Date/Time   K 4.3 01/13/2017 02:45 PM   BUN 20 01/13/2017 02:45 PM   CREATININE 0.98 01/13/2017 02:45 PM   ALT 47 01/13/2017 02:45 PM   TSH 3.83 01/07/2017 03:23 PM   HGB 15.9 01/13/2017 02:45 PM     Lipids: Lab Results  Component Value Date/Time   LDLCALC 53 01/08/2017 05:33 AM   CHOL 106 01/08/2017 05:33 AM   TRIG 35 01/08/2017 05:33 AM   HDL 46 01/08/2017 05:33 AM       ASSESSMENT AND PLAN: 1. Acute CVA involving the right cerebral hemisphere: He is stable on Plavix and Lipitor. Event monitoring did not demonstrate any evidence of atrial fibrillation.  2. Bilateral carotid artery disease with history of left carotid endarterectomy: Most recent Dopplers reviewed above. There was no evidence of flow-limiting stenosis. Continue Plavix and Lipitor.  3. Left ventricular diastolic dysfunction with low normal systolic function: No evidence of heart failure. He is on losartan 25 mg. Continue present therapy. Aim to control blood pressure.  4. Hypertension: Blood pressure is mildly elevated. If it remains elevated, I would increase the dose of losartan.     Disposition: Follow up  prn.  Time spent: 40 minutes, of which greater than 50% was spent reviewing symptoms, relevant blood tests and studies, and discussing management plan with the patient.    Kate Sable, M.D., F.A.C.C.

## 2017-02-16 NOTE — Patient Instructions (Signed)
Your physician recommends that you schedule a follow-up appointment in:  As needed     Your physician recommends that you continue on your current medications as directed. Please refer to the Current Medication list given to you today.       Thank you for choosing [facility] !

## 2017-02-18 DIAGNOSIS — E782 Mixed hyperlipidemia: Secondary | ICD-10-CM | POA: Diagnosis not present

## 2017-02-18 DIAGNOSIS — G4700 Insomnia, unspecified: Secondary | ICD-10-CM | POA: Diagnosis not present

## 2017-02-18 DIAGNOSIS — I10 Essential (primary) hypertension: Secondary | ICD-10-CM | POA: Diagnosis not present

## 2017-02-18 DIAGNOSIS — E119 Type 2 diabetes mellitus without complications: Secondary | ICD-10-CM | POA: Diagnosis not present

## 2017-03-03 DIAGNOSIS — E119 Type 2 diabetes mellitus without complications: Secondary | ICD-10-CM | POA: Diagnosis not present

## 2017-03-03 DIAGNOSIS — Z125 Encounter for screening for malignant neoplasm of prostate: Secondary | ICD-10-CM | POA: Diagnosis not present

## 2017-03-03 DIAGNOSIS — I10 Essential (primary) hypertension: Secondary | ICD-10-CM | POA: Diagnosis not present

## 2017-03-03 DIAGNOSIS — E782 Mixed hyperlipidemia: Secondary | ICD-10-CM | POA: Diagnosis not present

## 2017-03-05 DIAGNOSIS — E119 Type 2 diabetes mellitus without complications: Secondary | ICD-10-CM | POA: Diagnosis not present

## 2017-03-05 DIAGNOSIS — E782 Mixed hyperlipidemia: Secondary | ICD-10-CM | POA: Diagnosis not present

## 2017-03-05 DIAGNOSIS — G4700 Insomnia, unspecified: Secondary | ICD-10-CM | POA: Diagnosis not present

## 2017-03-05 DIAGNOSIS — I6529 Occlusion and stenosis of unspecified carotid artery: Secondary | ICD-10-CM | POA: Diagnosis not present

## 2017-03-05 DIAGNOSIS — Z0001 Encounter for general adult medical examination with abnormal findings: Secondary | ICD-10-CM | POA: Diagnosis not present

## 2017-03-05 DIAGNOSIS — I10 Essential (primary) hypertension: Secondary | ICD-10-CM | POA: Diagnosis not present

## 2017-04-07 ENCOUNTER — Ambulatory Visit: Payer: Medicare Other | Admitting: Family Medicine

## 2017-06-02 DIAGNOSIS — E782 Mixed hyperlipidemia: Secondary | ICD-10-CM | POA: Diagnosis not present

## 2017-06-02 DIAGNOSIS — I6529 Occlusion and stenosis of unspecified carotid artery: Secondary | ICD-10-CM | POA: Diagnosis not present

## 2017-06-02 DIAGNOSIS — E119 Type 2 diabetes mellitus without complications: Secondary | ICD-10-CM | POA: Diagnosis not present

## 2017-06-02 DIAGNOSIS — I10 Essential (primary) hypertension: Secondary | ICD-10-CM | POA: Diagnosis not present

## 2017-06-02 DIAGNOSIS — G4700 Insomnia, unspecified: Secondary | ICD-10-CM | POA: Diagnosis not present

## 2017-06-04 DIAGNOSIS — E1165 Type 2 diabetes mellitus with hyperglycemia: Secondary | ICD-10-CM | POA: Diagnosis not present

## 2017-06-04 DIAGNOSIS — G894 Chronic pain syndrome: Secondary | ICD-10-CM | POA: Diagnosis not present

## 2017-06-26 ENCOUNTER — Other Ambulatory Visit: Payer: Self-pay | Admitting: Family Medicine

## 2017-07-12 ENCOUNTER — Inpatient Hospital Stay (HOSPITAL_COMMUNITY)
Admission: EM | Admit: 2017-07-12 | Discharge: 2017-07-14 | DRG: 065 | Disposition: A | Discharge status: Home / Self Care | Payer: Medicare Other | Attending: Internal Medicine | Admitting: Internal Medicine

## 2017-07-12 ENCOUNTER — Other Ambulatory Visit: Payer: Self-pay

## 2017-07-12 ENCOUNTER — Encounter (HOSPITAL_COMMUNITY): Payer: Self-pay | Admitting: Emergency Medicine

## 2017-07-12 ENCOUNTER — Emergency Department (HOSPITAL_COMMUNITY): Payer: Medicare Other

## 2017-07-12 DIAGNOSIS — M2550 Pain in unspecified joint: Secondary | ICD-10-CM | POA: Diagnosis present

## 2017-07-12 DIAGNOSIS — I6381 Other cerebral infarction due to occlusion or stenosis of small artery: Secondary | ICD-10-CM | POA: Diagnosis not present

## 2017-07-12 DIAGNOSIS — R519 Headache, unspecified: Secondary | ICD-10-CM | POA: Diagnosis present

## 2017-07-12 DIAGNOSIS — I69952 Hemiplegia and hemiparesis following unspecified cerebrovascular disease affecting left dominant side: Secondary | ICD-10-CM | POA: Diagnosis not present

## 2017-07-12 DIAGNOSIS — Z8249 Family history of ischemic heart disease and other diseases of the circulatory system: Secondary | ICD-10-CM

## 2017-07-12 DIAGNOSIS — R471 Dysarthria and anarthria: Secondary | ICD-10-CM | POA: Diagnosis present

## 2017-07-12 DIAGNOSIS — I2510 Atherosclerotic heart disease of native coronary artery without angina pectoris: Secondary | ICD-10-CM | POA: Diagnosis not present

## 2017-07-12 DIAGNOSIS — I10 Essential (primary) hypertension: Secondary | ICD-10-CM | POA: Diagnosis present

## 2017-07-12 DIAGNOSIS — Z9889 Other specified postprocedural states: Secondary | ICD-10-CM

## 2017-07-12 DIAGNOSIS — E1151 Type 2 diabetes mellitus with diabetic peripheral angiopathy without gangrene: Secondary | ICD-10-CM | POA: Diagnosis present

## 2017-07-12 DIAGNOSIS — Z9181 History of falling: Secondary | ICD-10-CM | POA: Diagnosis not present

## 2017-07-12 DIAGNOSIS — Z8669 Personal history of other diseases of the nervous system and sense organs: Secondary | ICD-10-CM

## 2017-07-12 DIAGNOSIS — Z981 Arthrodesis status: Secondary | ICD-10-CM

## 2017-07-12 DIAGNOSIS — Z7902 Long term (current) use of antithrombotics/antiplatelets: Secondary | ICD-10-CM

## 2017-07-12 DIAGNOSIS — D6869 Other thrombophilia: Secondary | ICD-10-CM | POA: Diagnosis present

## 2017-07-12 DIAGNOSIS — G8194 Hemiplegia, unspecified affecting left nondominant side: Secondary | ICD-10-CM | POA: Diagnosis not present

## 2017-07-12 DIAGNOSIS — Z885 Allergy status to narcotic agent status: Secondary | ICD-10-CM

## 2017-07-12 DIAGNOSIS — Z886 Allergy status to analgesic agent status: Secondary | ICD-10-CM

## 2017-07-12 DIAGNOSIS — G8929 Other chronic pain: Secondary | ICD-10-CM | POA: Diagnosis not present

## 2017-07-12 DIAGNOSIS — I361 Nonrheumatic tricuspid (valve) insufficiency: Secondary | ICD-10-CM | POA: Diagnosis not present

## 2017-07-12 DIAGNOSIS — G894 Chronic pain syndrome: Secondary | ICD-10-CM | POA: Diagnosis present

## 2017-07-12 DIAGNOSIS — M4802 Spinal stenosis, cervical region: Secondary | ICD-10-CM | POA: Diagnosis not present

## 2017-07-12 DIAGNOSIS — I63411 Cerebral infarction due to embolism of right middle cerebral artery: Secondary | ICD-10-CM | POA: Diagnosis not present

## 2017-07-12 DIAGNOSIS — M47812 Spondylosis without myelopathy or radiculopathy, cervical region: Secondary | ICD-10-CM | POA: Diagnosis present

## 2017-07-12 DIAGNOSIS — S0990XA Unspecified injury of head, initial encounter: Secondary | ICD-10-CM | POA: Diagnosis not present

## 2017-07-12 DIAGNOSIS — I6789 Other cerebrovascular disease: Secondary | ICD-10-CM | POA: Diagnosis not present

## 2017-07-12 DIAGNOSIS — Z8679 Personal history of other diseases of the circulatory system: Secondary | ICD-10-CM | POA: Diagnosis not present

## 2017-07-12 DIAGNOSIS — M48061 Spinal stenosis, lumbar region without neurogenic claudication: Secondary | ICD-10-CM | POA: Diagnosis present

## 2017-07-12 DIAGNOSIS — R4781 Slurred speech: Secondary | ICD-10-CM | POA: Diagnosis not present

## 2017-07-12 DIAGNOSIS — S299XXA Unspecified injury of thorax, initial encounter: Secondary | ICD-10-CM | POA: Diagnosis not present

## 2017-07-12 DIAGNOSIS — I639 Cerebral infarction, unspecified: Secondary | ICD-10-CM | POA: Diagnosis not present

## 2017-07-12 DIAGNOSIS — Z79899 Other long term (current) drug therapy: Secondary | ICD-10-CM

## 2017-07-12 DIAGNOSIS — R29810 Facial weakness: Secondary | ICD-10-CM | POA: Diagnosis present

## 2017-07-12 DIAGNOSIS — I63233 Cerebral infarction due to unspecified occlusion or stenosis of bilateral carotid arteries: Secondary | ICD-10-CM | POA: Diagnosis not present

## 2017-07-12 DIAGNOSIS — Z8673 Personal history of transient ischemic attack (TIA), and cerebral infarction without residual deficits: Secondary | ICD-10-CM | POA: Diagnosis not present

## 2017-07-12 DIAGNOSIS — G4700 Insomnia, unspecified: Secondary | ICD-10-CM | POA: Diagnosis not present

## 2017-07-12 DIAGNOSIS — R29704 NIHSS score 4: Secondary | ICD-10-CM | POA: Diagnosis present

## 2017-07-12 DIAGNOSIS — R531 Weakness: Secondary | ICD-10-CM | POA: Diagnosis not present

## 2017-07-12 DIAGNOSIS — E119 Type 2 diabetes mellitus without complications: Secondary | ICD-10-CM

## 2017-07-12 DIAGNOSIS — R51 Headache: Secondary | ICD-10-CM | POA: Diagnosis not present

## 2017-07-12 DIAGNOSIS — I739 Peripheral vascular disease, unspecified: Secondary | ICD-10-CM | POA: Diagnosis present

## 2017-07-12 DIAGNOSIS — E782 Mixed hyperlipidemia: Secondary | ICD-10-CM | POA: Diagnosis present

## 2017-07-12 DIAGNOSIS — Z87891 Personal history of nicotine dependence: Secondary | ICD-10-CM

## 2017-07-12 DIAGNOSIS — I6389 Other cerebral infarction: Secondary | ICD-10-CM | POA: Diagnosis not present

## 2017-07-12 DIAGNOSIS — Z7984 Long term (current) use of oral hypoglycemic drugs: Secondary | ICD-10-CM

## 2017-07-12 LAB — CBC WITH DIFFERENTIAL/PLATELET
Basophils Absolute: 0 10*3/uL (ref 0.0–0.1)
Basophils Relative: 0 %
Eosinophils Absolute: 0.1 10*3/uL (ref 0.0–0.7)
Eosinophils Relative: 1 %
HCT: 38.6 % — ABNORMAL LOW (ref 39.0–52.0)
Hemoglobin: 13.1 g/dL (ref 13.0–17.0)
Lymphocytes Relative: 23 %
Lymphs Abs: 2.1 10*3/uL (ref 0.7–4.0)
MCH: 30.7 pg (ref 26.0–34.0)
MCHC: 33.9 g/dL (ref 30.0–36.0)
MCV: 90.4 fL (ref 78.0–100.0)
Monocytes Absolute: 0.9 10*3/uL (ref 0.1–1.0)
Monocytes Relative: 10 %
Neutro Abs: 5.9 10*3/uL (ref 1.7–7.7)
Neutrophils Relative %: 66 %
Platelets: 150 10*3/uL (ref 150–400)
RBC: 4.27 MIL/uL (ref 4.22–5.81)
RDW: 13.3 % (ref 11.5–15.5)
WBC: 9 10*3/uL (ref 4.0–10.5)

## 2017-07-12 LAB — BASIC METABOLIC PANEL
Anion gap: 11 (ref 5–15)
BUN: 28 mg/dL — ABNORMAL HIGH (ref 6–20)
CO2: 24 mmol/L (ref 22–32)
Calcium: 9.3 mg/dL (ref 8.9–10.3)
Chloride: 104 mmol/L (ref 101–111)
Creatinine, Ser: 1.55 mg/dL — ABNORMAL HIGH (ref 0.61–1.24)
GFR calc Af Amer: 51 mL/min — ABNORMAL LOW (ref >60)
GFR calc non Af Amer: 44 mL/min — ABNORMAL LOW (ref >60)
Glucose, Bld: 193 mg/dL — ABNORMAL HIGH (ref 65–99)
Potassium: 3.1 mmol/L — ABNORMAL LOW (ref 3.5–5.1)
Sodium: 139 mmol/L (ref 135–145)

## 2017-07-12 LAB — PHOSPHORUS: Phosphorus: 2.7 mg/dL (ref 2.5–4.6)

## 2017-07-12 LAB — GLUCOSE, CAPILLARY: GLUCOSE-CAPILLARY: 132 mg/dL — AB (ref 65–99)

## 2017-07-12 LAB — MAGNESIUM: MAGNESIUM: 1.8 mg/dL (ref 1.7–2.4)

## 2017-07-12 MED ORDER — FAMOTIDINE 20 MG PO TABS
20.0000 mg | ORAL_TABLET | Freq: Two times a day (BID) | ORAL | Status: DC
  Administered 2017-07-12 – 2017-07-14 (×4): 20 mg via ORAL
  Filled 2017-07-12 (×4): qty 1

## 2017-07-12 MED ORDER — ENOXAPARIN SODIUM 40 MG/0.4ML ~~LOC~~ SOLN
40.0000 mg | SUBCUTANEOUS | Status: DC
  Administered 2017-07-12 – 2017-07-13 (×2): 40 mg via SUBCUTANEOUS
  Filled 2017-07-12 (×2): qty 0.4

## 2017-07-12 MED ORDER — ONDANSETRON HCL 4 MG PO TABS: 4.0000 mg | ORAL_TABLET | Freq: Four times a day (QID) | ORAL | Status: DC | PRN

## 2017-07-12 MED ORDER — ONDANSETRON HCL 4 MG/2ML IJ SOLN: 4.0000 mg | Freq: Four times a day (QID) | INTRAMUSCULAR | Status: DC | PRN

## 2017-07-12 MED ORDER — ACETAMINOPHEN 650 MG RE SUPP: 650.0000 mg | RECTAL | Status: DC | PRN

## 2017-07-12 MED ORDER — METFORMIN HCL ER 500 MG PO TB24
500.0000 mg | ORAL_TABLET | Freq: Every day | ORAL | Status: DC
Start: 2017-07-13 — End: 2017-07-14
  Administered 2017-07-13 – 2017-07-14 (×2): 500 mg via ORAL
  Filled 2017-07-12 (×2): qty 1

## 2017-07-12 MED ORDER — AMITRIPTYLINE HCL 25 MG PO TABS
150.0000 mg | ORAL_TABLET | Freq: Every evening | ORAL | Status: DC | PRN
  Administered 2017-07-13: 150 mg via ORAL
  Filled 2017-07-12: qty 6

## 2017-07-12 MED ORDER — POTASSIUM CHLORIDE IN NACL 40-0.9 MEQ/L-% IV SOLN
INTRAVENOUS | Status: AC
  Administered 2017-07-13: 50 mL/h via INTRAVENOUS

## 2017-07-12 MED ORDER — INSULIN ASPART 100 UNIT/ML ~~LOC~~ SOLN
0.0000 [IU] | Freq: Three times a day (TID) | SUBCUTANEOUS | Status: DC
Start: 2017-07-13 — End: 2017-07-14
  Administered 2017-07-13 – 2017-07-14 (×3): 2 [IU] via SUBCUTANEOUS

## 2017-07-12 MED ORDER — CLOPIDOGREL BISULFATE 75 MG PO TABS
75.0000 mg | ORAL_TABLET | Freq: Every day | ORAL | Status: DC
  Administered 2017-07-13 – 2017-07-14 (×2): 75 mg via ORAL
  Filled 2017-07-12 (×2): qty 1

## 2017-07-12 MED ORDER — POTASSIUM CHLORIDE IN NACL 40-0.9 MEQ/L-% IV SOLN
INTRAVENOUS | Status: DC
  Filled 2017-07-12: qty 1000

## 2017-07-12 MED ORDER — ACETAMINOPHEN 325 MG PO TABS
650.0000 mg | ORAL_TABLET | ORAL | Status: DC | PRN
  Administered 2017-07-12 – 2017-07-13 (×3): 650 mg via ORAL
  Filled 2017-07-12 (×3): qty 2

## 2017-07-12 MED ORDER — STROKE: EARLY STAGES OF RECOVERY BOOK
Freq: Once | Status: AC
  Administered 2017-07-13: 01:00:00
  Filled 2017-07-12: qty 1

## 2017-07-12 MED ORDER — MAGNESIUM SULFATE 2 GM/50ML IV SOLN
2.0000 g | Freq: Once | INTRAVENOUS | Status: AC
  Administered 2017-07-13: 2 g via INTRAVENOUS
  Filled 2017-07-12: qty 50

## 2017-07-12 MED ORDER — ATORVASTATIN CALCIUM 40 MG PO TABS
40.0000 mg | ORAL_TABLET | Freq: Every day | ORAL | Status: DC
  Administered 2017-07-12 – 2017-07-13 (×2): 40 mg via ORAL
  Filled 2017-07-12 (×2): qty 1

## 2017-07-12 MED ORDER — ACETAMINOPHEN 160 MG/5ML PO SOLN: 650.0000 mg | ORAL | Status: DC | PRN

## 2017-07-12 MED ORDER — HYDRALAZINE HCL 20 MG/ML IJ SOLN: 10.0000 mg | INTRAMUSCULAR | Status: DC | PRN

## 2017-07-12 MED ORDER — SENNOSIDES-DOCUSATE SODIUM 8.6-50 MG PO TABS: 1.0000 | ORAL_TABLET | Freq: Every evening | ORAL | Status: DC | PRN

## 2017-07-12 MED ORDER — POTASSIUM CHLORIDE 10 MEQ/100ML IV SOLN
10.0000 meq | INTRAVENOUS | Status: AC
  Administered 2017-07-12 – 2017-07-13 (×2): 10 meq via INTRAVENOUS
  Filled 2017-07-12: qty 100

## 2017-07-12 MED ORDER — GLIPIZIDE 5 MG PO TABS
5.0000 mg | ORAL_TABLET | Freq: Every day | ORAL | Status: DC
  Administered 2017-07-13 – 2017-07-14 (×2): 5 mg via ORAL
  Filled 2017-07-12 (×2): qty 1

## 2017-07-12 MED ORDER — CLOPIDOGREL BISULFATE 75 MG PO TABS
75.0000 mg | ORAL_TABLET | Freq: Once | ORAL | Status: AC
  Administered 2017-07-12: 75 mg via ORAL
  Filled 2017-07-12: qty 1

## 2017-07-12 MED ORDER — POTASSIUM CHLORIDE 10 MEQ/100ML IV SOLN
10.0000 meq | INTRAVENOUS | Status: AC
  Filled 2017-07-12: qty 100

## 2017-07-12 NOTE — H&P (Signed)
History and Physical    Ryan Butler CIM:[id number] DOB: Feb 22, 1951 DOA: 07/12/2017  PCP: Benita Stabile, MD   Patient coming from: Home.  I have personally briefly reviewed patient's old medical records in [facility] Link  Chief Complaint: Left-sided weakness, nausea and emesis.  HPI: Ryan Butler is a 67 y.o. male with medical history significant of carotid artery disease, cervical spondylosis, history of closed head injury, essential hypertension, colon polyps, history of scarlet fever, history of seizures (last episode many years ago, not on pharmacotherapy), history of polysubstance abuse, type 2 diabetes, history of TIAs prior to carotid endarterectomy, history of CVA on Oct 07, 2016 who is brought to the emergency department with a history of waking up from his recliner around 0300 with left facial weakness, slurred speech and left sided extremities weakness.  This was followed by nausea and an episode of emesis.    While trying to ambulate the patient fell and hit his left hip, but no apparent injuries seen from this.  He also complains of a mild frontal headache, which is similar in symptoms to his usual episodes.  He denies blurred vision, language comprehension or word finding difficulty.  No fever, chills, sore throat, dyspnea, chest pain, palpitations, dizziness, diaphoresis, PND, orthopnea or pitting edema of the lower extremities.  No abdominal pain, diarrhea, constipation, melena or hematochezia.  Denies dysuria, frequency, hematuria or oliguria.  States that his blood glucose has been fairly controlled.  He denies blurred vision, polydipsia, polyuria or polyphagia.  No heat or cold intolerance.  He denies skin rashes or pruritus.  ED Course: Initial vital signs pulse 89, respiration 18, blood pressure 134/94 and O2 sat 99% on room air.  Telemetry neurology was consulted by the ED.  Admission for CVA workup and clopidogrel 75 mg p.o. x1 were suggested.  His CBC shows a white  count of 9.0 with a normal differential, hemoglobin 13.1 g/dL and platelets 977.  Sodium 139, potassium 3.1, chloride 104, CO2 24 mmol/L.  Glucose 193, BUN 28, creatinine 1.55, calcium 9.3 and magnesium 1.8 mg/dL.  EKG was sinus rhythm, borderline left axis deviation, with RSR in V1 and V2.   CT head without contrast showed: 1. Multifocal hypodensities involving the right frontal and parietooccipital lobes with additional age indeterminate hypodensity in the right basal ganglia. Findings are concerning for multifocal areas of edema/possible acute to subacute infarcts; further evaluation with MRI is recommended. No hemorrhage or focal mass lesion is seen.    Review of Systems: As per HPI otherwise 10 point review of systems negative.    Past Medical History:  Diagnosis Date  . Carotid artery disease (HCC)   . Cervical spondylosis   . Closed head injury 1996  . Essential hypertension, benign   . History of colonic polyps   . History of scarlet fever   . History of seizures   . History of TIAs   . Polysubstance abuse (HCC)    History of cocaine in his 30's  . Type 2 diabetes mellitus (HCC)    Pt stopped metformin 09/2013    Past Surgical History:  Procedure Laterality Date  . ANTERIOR CERVICAL DECOMP/DISCECTOMY FUSION N/A 12/05/2013   Procedure: ANTERIOR CERVICAL DECOMPRESSION/DISCECTOMY FUSION 2 LEVELS;  Surgeon: Mariam Dollar, MD;  Location: [facility];  Service: Neurosurgery;  Laterality: N/A;  Anterior Cervical Discectomy and Fusion with PEEK Cages and allograft Cervical Four-Five/Five-Six  . FRACTURE SURGERY    . Hand laceration repair    .  Left carotid endarterectomy     6/06 - Dr. Scot Dock  . skin graft to leg Left   . Tibia/fibula fracture repair    . VASCULAR SURGERY       reports that he has quit smoking. His smoking use included cigarettes. He has a 55.00 pack-year smoking history. he has never used smokeless tobacco. He reports that he uses drugs. Drugs: Marijuana and  Oxycodone. He reports that he does not drink alcohol.  Allergies  Allergen Reactions  . Aspirin Nausea And Vomiting  . Hydrocodone Nausea Only    Family History  Problem Relation Age of Onset  . Cancer Father   . Coronary artery disease Mother     Prior to Admission medications   Medication Sig Start Date End Date Taking? Authorizing Provider  amitriptyline (ELAVIL) 150 MG tablet TAKE ONE TABLET BY MOUTH AT BEDTIME AS NEEDED FOR SLEEP 01/07/17  Yes [place], Betty G, MD  amLODipine (NORVASC) 5 MG tablet Take 1 tablet (5 mg total) by mouth daily. 01/08/17  Yes Reyne Dumas, MD  atorvastatin (LIPITOR) 40 MG tablet Take 1 tablet (40 mg total) by mouth daily at 6 PM. 01/08/17  Yes Reyne Dumas, MD  clopidogrel (PLAVIX) 75 MG tablet Take 1 tablet (75 mg total) by mouth daily with breakfast. 01/09/17  Yes Reyne Dumas, MD  glipiZIDE (GLUCOTROL) 5 MG tablet Take 1 tablet (5 mg total) by mouth daily before breakfast. 12/24/15  Yes [place], Betty G, MD  losartan (COZAAR) 25 MG tablet Take 1 tablet (25 mg total) by mouth daily. 01/08/17  Yes Reyne Dumas, MD  metFORMIN (GLUCOPHAGE-XR) 500 MG 24 hr tablet Take 1 tablet (500 mg total) by mouth daily with breakfast. 01/13/17  Yes [place], Betty G, MD  OVER THE COUNTER MEDICATION Take by mouth daily as needed (Kratom powder mixed in liquid and drank as needed for back pain).   Yes [provider]  ACCU-CHEK AVIVA PLUS test strip USE ONE STRIP TO CHECK GLUCOSE TWICE DAILY 06/28/17   [place], Betty G, MD  ACCU-CHEK SOFTCLIX LANCETS lancets USE ONE LANCET TWICE DAILY AS DIRECTED TO CHECK BLOOD GLUCOSE 06/28/17   [place], Betty G, MD    Physical Exam: Vitals:   07/12/17 1751 07/12/17 1900 07/12/17 1940 07/12/17 2030  BP:  (!) 145/78 (!) 173/85 (!) 165/89  Pulse:  76 70 81  Resp:  $Remo'17 17 12  'iPREb$ SpO2:  98% 96% 99%  Weight: 70.3 kg (155 lb)     Height: $Remove'5\' 10"'IdfBKsu$  (1.778 m)       Constitutional: NAD, calm, comfortable Eyes: PERRL, EOMI, lids and conjunctivae  normal ENMT: Mucous membranes are moist. Posterior pharynx clear of any exudate or lesions. Neck: normal, supple, no masses, no thyromegaly Respiratory: clear to auscultation bilaterally, no wheezing, no crackles. Normal respiratory effort. No accessory muscle use.  Cardiovascular: Regular rate and rhythm, no murmurs / rubs / gallops. No extremity edema. 2+ pedal pulses. No carotid bruits.  Abdomen: Soft, no tenderness, no masses palpated. No hepatosplenomegaly. Bowel sounds positive.  Musculoskeletal: no clubbing / cyanosis. Good ROM, no contractures. Normal muscle tone.  Skin: Skin graft and skin graft surgery changes on LLE lower pretibial area. Otherwise, no clinically significant rashes, lesions, ulcers on limited dermatological exam. Neurologic: Left lower facial nerve weakness, all other CN examination seems to be grossly intact.  Decreased sensation on left-sided face and extremities, has speech seems to sound minimally slurred.  DTR normal.  3-1/2 / 5 LUE weakness, seen more pronounced with left  wrist drop.  4/5 LLE weakness.  Normal nose to finger coordination with use of RUE.  Left sided nose to finger coordination shows significant impairment, particularly inability to lift rest and use left-sided fingers.  The patient was able to perform her with his left thumb with mild difficulty.  Gait and Romberg were deferred. Psychiatric: Normal judgment and insight. Alert and oriented x 4. Normal mood.    Labs on Admission: I have personally reviewed following labs and imaging studies  CBC: Recent Labs  Lab 07/12/17 1831  WBC 9.0  NEUTROABS 5.9  HGB 13.1  HCT 38.6*  MCV 90.4  PLT 462   Basic Metabolic Panel: Recent Labs  Lab 07/12/17 1831  NA 139  K 3.1*  CL 104  CO2 24  GLUCOSE 193*  BUN 28*  CREATININE 1.55*  CALCIUM 9.3  MG 1.8   GFR: Estimated Creatinine Clearance: 44.7 mL/min (A) (by C-G formula based on SCr of 1.55 mg/dL (H)). Liver Function Tests: No results for  input(s): AST, ALT, ALKPHOS, BILITOT, PROT, ALBUMIN in the last 168 hours. No results for input(s): LIPASE, AMYLASE in the last 168 hours. No results for input(s): AMMONIA in the last 168 hours. Coagulation Profile: No results for input(s): INR, PROTIME in the last 168 hours. Cardiac Enzymes: No results for input(s): CKTOTAL, CKMB, CKMBINDEX, TROPONINI in the last 168 hours. BNP (last 3 results) No results for input(s): PROBNP in the last 8760 hours. HbA1C: No results for input(s): HGBA1C in the last 72 hours. CBG: No results for input(s): GLUCAP in the last 168 hours. Lipid Profile: No results for input(s): CHOL, HDL, LDLCALC, TRIG, CHOLHDL, LDLDIRECT in the last 72 hours. Thyroid Function Tests: No results for input(s): TSH, T4TOTAL, FREET4, T3FREE, THYROIDAB in the last 72 hours. Anemia Panel: No results for input(s): VITAMINB12, FOLATE, FERRITIN, TIBC, IRON, RETICCTPCT in the last 72 hours. Urine analysis:    Component Value Date/Time   COLORURINE STRAW (A) 01/07/2017 [place] 01/07/2017 0944   LABSPEC 1.006 01/07/2017 0944   PHURINE 6.0 01/07/2017 0944   GLUCOSEU 50 (A) 01/07/2017 0944   HGBUR NEGATIVE 01/07/2017 [place] 01/07/2017 [place] 01/07/2017 0944   PROTEINUR NEGATIVE 01/07/2017 0944   NITRITE NEGATIVE 01/07/2017 0944   LEUKOCYTESUR NEGATIVE 01/07/2017 0944    Radiological Exams on Admission: Ct Head Wo Contrast  Result Date: 07/12/2017 CLINICAL DATA:  Fall left-sided arm weakness, left facial droop EXAM: CT HEAD WITHOUT CONTRAST TECHNIQUE: Contiguous axial images were obtained from the base of the skull through the vertex without intravenous contrast. COMPARISON:  MRI 01/07/2017, CT brain 10/25/2013 FINDINGS: Brain: No hemorrhage or intracranial mass is visualized. Multifocal hypodensity visualized within the right frontal lobe and right parietooccipital lobes. Additional mild hypodensity in the right basal  ganglia compared to prior. Mild atrophy. Normal ventricle size. No midline shift. Vascular: No hyperdense vessels. Vertebral artery and carotid vessel calcification Skull: No fracture or suspicious lesion Sinuses/Orbits: No acute finding. Other: None IMPRESSION: 1. Multifocal hypodensities involving the right frontal and parietooccipital lobes with additional age indeterminate hypodensity in the right basal ganglia. Findings are concerning for multifocal areas of edema/possible acute to subacute infarcts; further evaluation with MRI is recommended. No hemorrhage or focal mass lesion is seen. Electronically Signed   By: Donavan Foil M.D.   On: 07/12/2017 19:04   01/08/2017 Echocardiogram complete. ------------------------------------------------------------------- LV EF: 50%  ------------------------------------------------------------------- Indications:      CVA 436.  ------------------------------------------------------------------- History:  PMH:  CAROTID ENDARTERECTOMY.  Stroke.  Risk factors: Current tobacco use. Hypertension. Diabetes mellitus. Dyslipidemia.   ------------------------------------------------------------------- Study Conclusions  - Left ventricle: The cavity size was normal. Wall thickness was   increased in a pattern of mild LVH. Systolic function was at the   lower limits of normal. The estimated ejection fraction was 50%.   Wall motion was normal; there were no regional wall motion   abnormalities. Features are consistent with a pseudonormal left   ventricular filling pattern, with concomitant abnormal relaxation   and increased filling pressure (grade 2 diastolic dysfunction).   Doppler parameters are consistent with high ventricular filling   pressure. - Aortic valve: Sclerosis without stenosis. - Mitral valve: There was mild regurgitation. - Atrial septum: No defect or patent foramen ovale was identified. - Tricuspid valve: There was mild  regurgitation.  EKG: Independently reviewed. Vent. rate 78 BPM PR interval * ms QRS duration 105 ms QT/QTc 395/450 ms P-R-T axes 56 -25 54 Sinus rhythm Borderline left axis deviation RSR' in V1 or V2, right VCD or RVH  Assessment/Plan Principal Problem:   Acute CVA (cerebrovascular accident) ([place]) Admit to inpatient/telemetry. Frequent neuro checks. Continue clopidogrel 75 mg p.o. daily. Hold antihypertensives and allow permissive hypertension. Physical and occupational therapy evaluation. Check fasting lipids in a.m. Check hemoglobin A1c in the morning. Check carotid Doppler and echocardiogram. Check MR brain and MRA head/brain without contrast. Consult neurology.  Active Problems:   Essential hypertension, benign Allow permissive hypertension. Hold antihypertensive medications for now. Monitor blood pressure closely. Hydralazine 10 mg IVP every 4 hours as needed for SBP > 220 or DBP > 120 mmHg.    Mixed hyperlipidemia Continue atorvastatin 40 mg p.o. daily.    Headache, unspecified headache type Patient, headache is mild and has his typical features. Tylenol as needed. Avoid NSAIDs or narcotics for now.    CAROTID ENDARTERECTOMY, LEFT, HX OF   PVD (peripheral vascular disease) (HCC) Continue clopidogrel and statin.    Insomnia Continue amitriptyline as needed.    Chronic pain disorder The patient has a history of cervical spinal stenosis and spondylosis. He also has a history of spinal stenosis of the lumbar region and polyarthralgia. Tylenol as needed.  Continue nocturnal amitriptyline.    Type 2 diabetes mellitus (HCC) Last hemoglobin A1c level was 7.6% in August. Carbohydrate modified diet. Continue glipizide 5 mg p.o. daily. Continue Glucophage XR 500 mg p.o. daily. Check hemoglobin A1c in the morning.    DVT prophylaxis: Lovenox SQ. Code Status: Full code. Family Communication: His daughter Lenna Sciara was with him in the emergency  department. Disposition Plan: Admit for CVA workup and neurology evaluation. Consults called: Routine neurology consult. Admission status: Inpatient/telemetry.   Reubin Milan MD [facility] Pager [phone].  If 7PM-7AM, please contact night-coverage [email or web address] Password [email or web address]  07/12/2017, 9:34 PM   This document was prepared using Dragon voice recognition software and may contain some unintended errors.

## 2017-07-12 NOTE — ED Triage Notes (Signed)
Pt fell at 3 am and hit left hip. Son helped pt back to bed. Pt vomited once and c/o of left side arm weakness, left sided facial drop and slurred speech.  Pt a/o x4.

## 2017-07-12 NOTE — ED Notes (Signed)
Tele neuro at Bedside

## 2017-07-12 NOTE — ED Notes (Addendum)
MD Eulis Foster notified regarding pt's arrival and s/s

## 2017-07-12 NOTE — ED Notes (Signed)
Tele neurologist paged for Dr Wilson Singer

## 2017-07-12 NOTE — Consult Note (Signed)
[facility] Tele[facility]urology Consult Services  STAT CONSULTATION DATE: July 12, 2017  Impression: Stroke -The patient appears to have some new hypodensity in the right occipital region as well as the right basal ganglia which doesn't seem apparent on his MRI from September of last year even though similar distributions were involved do not to the basal ganglia involved at that time.  It looks like he had a Holter monitor as an outpatient but no definitive atrial fibrillation.  Since there was some sort of intolerance to aspirin would discontinue him on Plavix and statin therapy for now he will need inpatient neurology consultation and physical and occupational therapy.  Recommend permissive hypertension for now TPA stroke protocol   Not a tpa candidate due to: Last known normal over 4.5 hours ago  Symptoms (not) consistent with LVO therefore no role for advanced neuro imaging or neurological intervention  Differential Diagnosis: 1. Cardioembolic stroke-concern for this is high with multifocal strokes in the past and present 2. Small vessel disease/lacune 3. Thromboembolic, artery-to-artery mechanism 4. Hypercoagulable state-related infarct 5. Transient ischemic attack 6. Thrombotic mechanism, large artery disease  Comments: Last known normal 23:00  Recommendations: -continue plavix 75 -Inpatient neurology consultation/stroke workup -Permissive hypertension for now TPA stroke protocol  Inpatient neurology consultation Inpatient stroke evaluation as per Neurology/ Internal Medicine Discussed with ED MD Please call with questions ----------------------------------------------------------------------------------------- CC worsening left-sided weakness  History of Present Illness  Patient is an 67 -year-old man with a history of a prior CVA involving the right cerebral hemisphere in September 2018.  He was intolerant of aspirin so he was switched to Plavix and started on a  statin.  He had an echocardiogram showing an EF of 50% he has a history of hyperlipidemia, hypertension, left carotid endarterectomy and tobacco abuse.  Today he presents with a last known normal of 23:00 he woke up at 3 AM with some left-sided weakness. He hasn't taken any of his meds yet today. He doesn't have any sx he didn't have with his prior cva but this is much worse. Diagnostic: CT of the head shows multifocal hypodensities involving the right frontal and parietal occipital lobes with age indeterminate hypodensity in the right basal ganglia  Exam: 1a- LOC: Keenly responsive - =0     1b- LOC questions: Answers both questions correctly - 0     1c- LOC commands- Performs both tasks correctly- 0     2- Gaze: Normal; no gaze paresis or gaze deviation - 0     3- Visual Fields: normal, no Visual field deficit - 0     4- Facial movements: Left lower facial droop=2 5.  Upper extremity - no drift -0    there is however significant weakness in the left hand distally 6- Lower limb motor - no drift - 0     no drift the left leg is weaker 7- Limb Coordination: absent ataxia - 0      8- Sensory : Decreased sensation in the left arm and leg face feels symmetric=1   9- Language - No aphasia - 0      10- Speech mild to moderate dysarthria=1  11- Neglect / Extinction - none found -0     NIHSS score  4   Medical Decision Making: - Extensive number of diagnosis or management options are considered above. - Extensive amount of complex data reviewed. - High risk of complication and/or morbidity or mortality are associated with differential diagnostic considerations above. - There may be Uncertain outcome and  increased probability of prolonged functional impairment or high probability of severe prolonged functional impairment associated with some of these differential diagnosis. Medical Data Reviewed: 1.Data reviewed include clinical labs, radiology, Medical Tests; 2.Tests results discussed  w/performing or interpreting physician; 3.Obtaining/reviewing old medical records; 4.Obtaining case history from another source; 5.Independent review of image, tracing or specimen. Patient was informed the Neurology Consult would happen via telehealth (remote video) and consented to receiving care in this manner.

## 2017-07-12 NOTE — ED Provider Notes (Signed)
[facility] EMERGENCY DEPARTMENT Provider Note   CSN: [id number] Arrival date & time: 07/12/17  1744     History   Chief Complaint Chief Complaint  Patient presents with  . Weakness    HPI Ryan Butler is a 67 y.o. male.  HPI   67 year old male with left-sided weakness.  Last known normal at 11 PM yesterday.  He woke up at 3 AM and fell because of left-sided weakness.  He felt nauseated and vomited at that time.  He socially went back to bed.  When he woke back up he had persistent symptoms which have been fairly stable throughout the day.  Mild headache.  Past Medical History:  Diagnosis Date  . Carotid artery disease ([place])   . Cervical spondylosis   . Closed head injury 1996  . Essential hypertension, benign   . History of colonic polyps   . History of scarlet fever   . History of seizures   . History of TIAs   . Polysubstance abuse ([place])    History of cocaine in his 30's  . Type 2 diabetes mellitus ([place])    Pt stopped metformin 09/2013    Patient Active Problem List   Diagnosis Date Noted  . Polyarthralgia 01/13/2017  . CVA (cerebral vascular accident) ([place]) 01/07/2017  . PVD (peripheral vascular disease) ([place]) 11/30/2015  . Insomnia 11/30/2015  . Chronic pain disorder 11/30/2015  . Spinal stenosis of lumbar region 04/25/2015  . Spinal stenosis in cervical region 12/05/2013  . Precordial pain 09/17/2011  . TOBACCO ABUSE 03/26/2006  . Headache, unspecified headache type 03/26/2006  . Type 2 diabetes mellitus with neurological manifestations, uncontrolled ([place]) 03/24/2006  . Mixed hyperlipidemia 03/24/2006  . Essential hypertension, benign 03/24/2006  . CAROTID ENDARTERECTOMY, LEFT, HX OF 03/04/2005    Past Surgical History:  Procedure Laterality Date  . ANTERIOR CERVICAL DECOMP/DISCECTOMY FUSION N/A 12/05/2013   Procedure: ANTERIOR CERVICAL DECOMPRESSION/DISCECTOMY FUSION 2 LEVELS;  Surgeon: Elaina Hoops, MD;  Location: [place] [facility];  Service:  Neurosurgery;  Laterality: N/A;  Anterior Cervical Discectomy and Fusion with PEEK Cages and allograft Cervical Four-Five/Five-Six  . FRACTURE SURGERY    . Hand laceration repair    . Left carotid endarterectomy     6/06 - Dr. Scot Dock  . skin graft to leg Left   . Tibia/fibula fracture repair    . VASCULAR SURGERY         Home Medications    Prior to Admission medications   Medication Sig Start Date End Date Taking? Authorizing Provider  amitriptyline (ELAVIL) 150 MG tablet TAKE ONE TABLET BY MOUTH AT BEDTIME AS NEEDED FOR SLEEP 01/07/17  Yes Martinique, Betty G, MD  amLODipine (NORVASC) 5 MG tablet Take 1 tablet (5 mg total) by mouth daily. 01/08/17  Yes Reyne Dumas, MD  atorvastatin (LIPITOR) 40 MG tablet Take 1 tablet (40 mg total) by mouth daily at 6 PM. 01/08/17  Yes Reyne Dumas, MD  clopidogrel (PLAVIX) 75 MG tablet Take 1 tablet (75 mg total) by mouth daily with breakfast. 01/09/17  Yes Reyne Dumas, MD  glipiZIDE (GLUCOTROL) 5 MG tablet Take 1 tablet (5 mg total) by mouth daily before breakfast. 12/24/15  Yes Martinique, Betty G, MD  losartan (COZAAR) 25 MG tablet Take 1 tablet (25 mg total) by mouth daily. 01/08/17  Yes Reyne Dumas, MD  metFORMIN (GLUCOPHAGE-XR) 500 MG 24 hr tablet Take 1 tablet (500 mg total) by mouth daily with breakfast. 01/13/17  Yes Martinique, Betty G, MD  OVER THE COUNTER MEDICATION Take by mouth daily as needed (Kratom powder mixed in liquid and drank as needed for back pain).   Yes [provider]  ACCU-CHEK AVIVA PLUS test strip USE ONE STRIP TO CHECK GLUCOSE TWICE DAILY 06/28/17   [place], Betty G, MD  ACCU-CHEK SOFTCLIX LANCETS lancets USE ONE LANCET TWICE DAILY AS DIRECTED TO CHECK BLOOD GLUCOSE 06/28/17   [place], Betty G, MD    Family History Family History  Problem Relation Age of Onset  . Cancer Father   . Coronary artery disease Mother     Social History Social History   Tobacco Use  . Smoking status: Former Smoker    Packs/day: 1.00     Years: 55.00    Pack years: 55.00    Types: Cigarettes  . Smokeless tobacco: Never Used  . Tobacco comment: smokes 1/2 pack per day now; reports that he starting to quick  Substance Use Topics  . Alcohol use: No    Comment: drinks occasional reports former drinker  . Drug use: Yes    Types: Marijuana, Oxycodone     Allergies   Aspirin and Hydrocodone   Review of Systems Review of Systems   Physical Exam Updated Vital Signs BP (!) 134/94   Pulse 90   Resp 15   Ht $R'5\' 10"'dv$  (1.778 m)   Wt 70.3 kg (155 lb)   SpO2 100%   BMI 22.24 kg/m   Physical Exam  Constitutional: He appears well-developed and well-nourished. No distress.  HENT:  Head: Normocephalic and atraumatic.  Eyes: Conjunctivae are normal. Right eye exhibits no discharge. Left eye exhibits no discharge.  Neck: Neck supple.  Cardiovascular: Normal rate, regular rhythm and normal heart sounds. Exam reveals no gallop and no friction rub.  No murmur heard. Pulmonary/Chest: Effort normal and breath sounds normal. No respiratory distress.  Abdominal: Soft. He exhibits no distension. There is no tenderness.  Musculoskeletal: He exhibits no edema or tenderness.  Neurological: He is alert.  Left facial droop.  Left upper extremity and left lower extremity weakness.  Right weakness and left upper extremities worse distally than in his proximally.  Speech is dysarthric but I suspect this is from speech dysarthric, but understandable.  Cranial nerves II through XII otherwise intact.  Sensation is intact to light touch.  Skin: Skin is warm and dry.  Psychiatric: He has a normal mood and affect. His behavior is normal. Thought content normal.  Nursing note and vitals reviewed.    ED Treatments / Results  Labs (all labs ordered are listed, but only abnormal results are displayed) Labs Reviewed  CBC WITH DIFFERENTIAL/PLATELET - Abnormal; Notable for the following components:      Result Value   HCT 38.6 (*)    All other  components within normal limits  BASIC METABOLIC PANEL - Abnormal; Notable for the following components:   Potassium 3.1 (*)    Glucose, Bld 193 (*)    BUN 28 (*)    Creatinine, Ser 1.55 (*)    GFR calc non Af Amer 44 (*)    GFR calc Af Amer 51 (*)    All other components within normal limits  HEMOGLOBIN A1C - Abnormal; Notable for the following components:   Hgb A1c MFr Bld 7.7 (*)    All other components within normal limits  LIPID PANEL - Abnormal; Notable for the following components:   HDL 39 (*)    All other components within normal limits  GLUCOSE, CAPILLARY - Abnormal;  Notable for the following components:   Glucose-Capillary 132 (*)    All other components within normal limits  GLUCOSE, CAPILLARY - Abnormal; Notable for the following components:   Glucose-Capillary 121 (*)    All other components within normal limits  GLUCOSE, CAPILLARY - Abnormal; Notable for the following components:   Glucose-Capillary 155 (*)    All other components within normal limits  MAGNESIUM  PHOSPHORUS  GLUCOSE, CAPILLARY  BASIC METABOLIC PANEL  CBC    EKG  EKG Interpretation  Date/Time:  Sunday July 12 2017 17:55:30 EST Ventricular Rate:  78 PR Interval:    QRS Duration: 105 QT Interval:  395 QTC Calculation: 450 R Axis:   -25 Text Interpretation:  Sinus rhythm Borderline left axis deviation RSR' in V1 or V2, right VCD or RVH since last tracing no significant change Confirmed by Daleen Bo [phone]) on 07/12/2017 6:28:00 PM       Radiology Dg Chest 2 View  Result Date: 07/13/2017 CLINICAL DATA:  67 year old male with acute CVA.  Fall yesterday. EXAM: CHEST  2 VIEW COMPARISON:  01/07/2017 and prior chest radiographs FINDINGS: The cardiomediastinal silhouette is unremarkable. There is no evidence of focal airspace disease, pulmonary edema, suspicious pulmonary nodule/mass, pleural effusion, or pneumothorax. No acute bony abnormalities are identified. Cervical spine fusion  hardware again noted. IMPRESSION: No active cardiopulmonary disease. Electronically Signed   By: Margarette Canada M.D.   On: 07/13/2017 08:58   Ct Head Wo Contrast  Result Date: 07/12/2017 CLINICAL DATA:  Fall left-sided arm weakness, left facial droop EXAM: CT HEAD WITHOUT CONTRAST TECHNIQUE: Contiguous axial images were obtained from the base of the skull through the vertex without intravenous contrast. COMPARISON:  MRI 01/07/2017, CT brain 10/25/2013 FINDINGS: Brain: No hemorrhage or intracranial mass is visualized. Multifocal hypodensity visualized within the right frontal lobe and right parietooccipital lobes. Additional mild hypodensity in the right basal ganglia compared to prior. Mild atrophy. Normal ventricle size. No midline shift. Vascular: No hyperdense vessels. Vertebral artery and carotid vessel calcification Skull: No fracture or suspicious lesion Sinuses/Orbits: No acute finding. Other: None IMPRESSION: 1. Multifocal hypodensities involving the right frontal and parietooccipital lobes with additional age indeterminate hypodensity in the right basal ganglia. Findings are concerning for multifocal areas of edema/possible acute to subacute infarcts; further evaluation with MRI is recommended. No hemorrhage or focal mass lesion is seen. Electronically Signed   By: Donavan Foil M.D.   On: 07/12/2017 19:04   Mr Brain Wo Contrast  Result Date: 07/13/2017 CLINICAL DATA:  Stroke follow-up.  Left-sided weakness for 2-3 days. EXAM: MRI HEAD WITHOUT CONTRAST MRA HEAD WITHOUT CONTRAST TECHNIQUE: Multiplanar, multiecho pulse sequences of the brain and surrounding structures were obtained without intravenous contrast. Angiographic images of the head were obtained using MRA technique without contrast. COMPARISON:  Head CT from yesterday.  Brain MRI 01/07/2017 FINDINGS: MRI HEAD FINDINGS Brain: Multifocal moderate to large area of cortically based infarct in the right posterior frontal and occipital parietal  region. There is also patchy acute infarction in the right putamen, internal capsule, and caudate nucleus. No hemorrhage, hydrocephalus, or masslike finding. Minor chronic small vessel ischemic type change in the periventricular white matter. Punctate acute infarct in the right cerebellum. Asymmetric increased DWI signal of the right hippocampus. Vascular: Arterial findings below. Normal dural venous sinus flow voids. Skull and upper cervical spine: No evidence of marrow lesion. Asymmetric right cervical facet arthropathy. C4-5 ACDF. Sinuses/Orbits: Negative MRA HEAD FINDINGS Advanced intracranial atherosclerosis. No acute finding when compared to  prior. Extensive atherosclerotic irregularity of the bilateral carotid siphons with severe stenosis of the right mid cavernous segment where there is flow gap. There is moderate left cavernous narrowing. Moderate narrowing of the left V4 segment. Advanced narrowing of left distal P2 and right proximal P3 segments. Moderate narrowing of left M2 branches. Moderate narrowing of proximal right pica. Mild narrowing of the mid basilar. IMPRESSION: 1. Multifocal acute infarction along the right MCA and border zone cortex. Multifocal acute right basal ganglia infarct. Infarcts in this distribution were also seen 01/07/2017. 2. Small acute infarct in the right cerebellum. 3. Restricted diffusion in the right hippocampus, please correlate for seizure activity. 4. Stable intracranial MRA with advanced atherosclerosis. Most notable in this setting is a severe right ICA cavernous segment stenosis with flow gap. Additional narrowings described above. Electronically Signed   By: Marnee Spring M.D.   On: 07/13/2017 08:36   US Carotid Bilateral (at [facility] And [facility] Only)  Result Date: 07/13/2017 CLINICAL DATA:  Acute CVA. Left facial weakness for 1 day. History of left carotid endarterectomy. EXAM: BILATERAL CAROTID DUPLEX ULTRASOUND TECHNIQUE: Wallace Cullens scale imaging, color Doppler and duplex  ultrasound were performed of bilateral carotid and vertebral arteries in the neck. COMPARISON:  01/07/2017 FINDINGS: Criteria: Quantification of carotid stenosis is based on velocity parameters that correlate the residual internal carotid diameter with NASCET-based stenosis levels, using the diameter of the distal internal carotid lumen as the denominator for stenosis measurement. The following velocity measurements were obtained: RIGHT ICA:  93 cm/sec CCA:  101 cm/sec SYSTOLIC ICA/CCA RATIO:  0.9 DIASTOLIC ICA/CCA RATIO:  1.0 ECA:  169 cm/sec LEFT ICA:  91 cm/sec CCA:  98 cm/sec SYSTOLIC ICA/CCA RATIO:  0.9 DIASTOLIC ICA/CCA RATIO:  1.7 ECA:  64 cm/sec RIGHT CAROTID ARTERY: Mild smooth mixed plaque along the bulb wall. Low resistance internal carotid Doppler pattern. RIGHT VERTEBRAL ARTERY:  Antegrade. LEFT CAROTID ARTERY: Mild smooth soft plaque along the wall of the bulb. Low resistance internal carotid Doppler pattern. LEFT VERTEBRAL ARTERY:  Antegrade. IMPRESSION: Less than 50% stenosis in the right and left internal carotid arteries. Electronically Signed   By: Jolaine Click M.D.   On: 07/13/2017 12:38   Mr Maxine Glenn Head/brain BZ Cm  Result Date: 07/13/2017 CLINICAL DATA:  Stroke follow-up.  Left-sided weakness for 2-3 days. EXAM: MRI HEAD WITHOUT CONTRAST MRA HEAD WITHOUT CONTRAST TECHNIQUE: Multiplanar, multiecho pulse sequences of the brain and surrounding structures were obtained without intravenous contrast. Angiographic images of the head were obtained using MRA technique without contrast. COMPARISON:  Head CT from yesterday.  Brain MRI 01/07/2017 FINDINGS: MRI HEAD FINDINGS Brain: Multifocal moderate to large area of cortically based infarct in the right posterior frontal and occipital parietal region. There is also patchy acute infarction in the right putamen, internal capsule, and caudate nucleus. No hemorrhage, hydrocephalus, or masslike finding. Minor chronic small vessel ischemic type change in the  periventricular white matter. Punctate acute infarct in the right cerebellum. Asymmetric increased DWI signal of the right hippocampus. Vascular: Arterial findings below. Normal dural venous sinus flow voids. Skull and upper cervical spine: No evidence of marrow lesion. Asymmetric right cervical facet arthropathy. C4-5 ACDF. Sinuses/Orbits: Negative MRA HEAD FINDINGS Advanced intracranial atherosclerosis. No acute finding when compared to prior. Extensive atherosclerotic irregularity of the bilateral carotid siphons with severe stenosis of the right mid cavernous segment where there is flow gap. There is moderate left cavernous narrowing. Moderate narrowing of the left V4 segment. Advanced narrowing of left distal P2 and right  proximal P3 segments. Moderate narrowing of left M2 branches. Moderate narrowing of proximal right pica. Mild narrowing of the mid basilar. IMPRESSION: 1. Multifocal acute infarction along the right MCA and border zone cortex. Multifocal acute right basal ganglia infarct. Infarcts in this distribution were also seen 01/07/2017. 2. Small acute infarct in the right cerebellum. 3. Restricted diffusion in the right hippocampus, please correlate for seizure activity. 4. Stable intracranial MRA with advanced atherosclerosis. Most notable in this setting is a severe right ICA cavernous segment stenosis with flow gap. Additional narrowings described above. Electronically Signed   By: Monte Fantasia M.D.   On: 07/13/2017 08:36    Procedures Procedures (including critical care time)  Medications Ordered in ED Medications - No data to display   Initial Impression / Assessment and Plan / ED Course  I have reviewed the triage vital signs and the nursing notes.  Pertinent labs & imaging results that were available during my care of the patient were reviewed by me and considered in my medical decision making (see chart for details).     67 year old male with left-sided weakness.  He is  outside the window for TPA.  No symptoms suggest large vessel occlusion.  Apparently he has aspirin intolerance or allergy.  Plavix.  Admit.   Final Clinical Impressions(s) / ED Diagnoses   Final diagnoses:  Acute CVA (cerebrovascular accident) [facility])  Acute CVA (cerebrovascular accident) [facility])    ED Discharge Orders    None       Virgel Manifold, MD 07/13/17 2355

## 2017-07-13 ENCOUNTER — Inpatient Hospital Stay (HOSPITAL_COMMUNITY): Payer: Medicare Other

## 2017-07-13 DIAGNOSIS — I639 Cerebral infarction, unspecified: Secondary | ICD-10-CM

## 2017-07-13 DIAGNOSIS — I361 Nonrheumatic tricuspid (valve) insufficiency: Secondary | ICD-10-CM

## 2017-07-13 LAB — ECHOCARDIOGRAM COMPLETE
AVLVOTPG: 5 mmHg
CHL CUP STROKE VOLUME: 41 mL
CHL CUP TV REG PEAK VELOCITY: 251 cm/s
E/e' ratio: 11.37
EWDT: 313 ms
FS: 41 % (ref 28–44)
Height: 70 in
IV/PV OW: 1.07
LA ID, A-P, ES: 37 mm
LA diam index: 1.97 cm/m2
LA vol index: 23.6 mL/m2
LA vol: 44.3 mL
LAVOLA4C: 42.6 mL
LDCA: 3.14 cm2
LEFT ATRIUM END SYS DIAM: 37 mm
LV E/e' medial: 11.37
LV E/e'average: 11.37
LV PW d: 10.4 mm — AB (ref 0.6–1.1)
LV SIMPSON'S DISK: 63
LV TDI E'LATERAL: 7.51
LV TDI E'MEDIAL: 5.55
LV dias vol index: 34 mL/m2
LV e' LATERAL: 7.51 cm/s
LV sys vol index: 13 mL/m2
LVDIAVOL: 64 mL (ref 62–150)
LVOT SV: 70 mL
LVOT VTI: 22.2 cm
LVOT diameter: 20 mm
LVOT peak vel: 109 cm/s
LVSYSVOL: 24 mL (ref 21–61)
MV Dec: 313
MV Peak grad: 3 mmHg
MVPKAVEL: 124 m/s
MVPKEVEL: 85.4 m/s
RV LATERAL S' VELOCITY: 17.4 cm/s
RV sys press: 28 mmHg
TAPSE: 19.2 mm
TRMAXVEL: 251 cm/s
Weight: 2515.0077 oz

## 2017-07-13 LAB — GLUCOSE, CAPILLARY
GLUCOSE-CAPILLARY: 121 mg/dL — AB (ref 65–99)
Glucose-Capillary: 118 mg/dL — ABNORMAL HIGH (ref 65–99)
Glucose-Capillary: 155 mg/dL — ABNORMAL HIGH (ref 65–99)
Glucose-Capillary: 89 mg/dL (ref 65–99)

## 2017-07-13 LAB — LIPID PANEL
Cholesterol: 126 mg/dL (ref 0–200)
HDL: 39 mg/dL — ABNORMAL LOW (ref >40)
LDL CALC: 67 mg/dL (ref 0–99)
TRIGLYCERIDES: 102 mg/dL (ref <150)
Total CHOL/HDL Ratio: 3.2 RATIO
VLDL: 20 mg/dL (ref 0–40)

## 2017-07-13 LAB — HEMOGLOBIN A1C
Hgb A1c MFr Bld: 7.7 % — ABNORMAL HIGH (ref 4.8–5.6)
Mean Plasma Glucose: 174.29 mg/dL

## 2017-07-13 MED ORDER — ASPIRIN 81 MG PO CHEW
162.0000 mg | CHEWABLE_TABLET | Freq: Every day | ORAL | Status: DC
  Administered 2017-07-13 – 2017-07-14 (×2): 162 mg via ORAL
  Filled 2017-07-13: qty 2

## 2017-07-13 NOTE — Evaluation (Signed)
Clinical/Bedside Swallow Evaluation Patient Details  Name: Ryan Butler MRN: [id number] Date of Birth: 19-Jul-1950  Today's Date: 07/13/2017 Time: SLP Start Time (ACUTE ONLY): 1316 SLP Stop Time (ACUTE ONLY): 1333 SLP Time Calculation (min) (ACUTE ONLY): 17 min  Past Medical History:  Past Medical History:  Diagnosis Date  . Carotid artery disease ([place])   . Cervical spondylosis   . Closed head injury 1996  . Essential hypertension, benign   . History of colonic polyps   . History of scarlet fever   . History of seizures   . History of TIAs   . Polysubstance abuse ([place])    History of cocaine in his 30's  . Type 2 diabetes mellitus ([place])    Pt stopped metformin 09/2013   Past Surgical History:  Past Surgical History:  Procedure Laterality Date  . ANTERIOR CERVICAL DECOMP/DISCECTOMY FUSION N/A 12/05/2013   Procedure: ANTERIOR CERVICAL DECOMPRESSION/DISCECTOMY FUSION 2 LEVELS;  Surgeon: Elaina Hoops, MD;  Location: [place] [facility];  Service: Neurosurgery;  Laterality: N/A;  Anterior Cervical Discectomy and Fusion with PEEK Cages and allograft Cervical Four-Five/Five-Six  . FRACTURE SURGERY    . Hand laceration repair    . Left carotid endarterectomy     6/06 - Dr. Scot Dock  . skin graft to leg Left   . Tibia/fibula fracture repair    . VASCULAR SURGERY     HPI:  Ryan Butler is a 67 y.o. male with medical history significant of carotid artery disease, cervical spondylosis, history of closed head injury, essential hypertension, colon polyps, history of scarlet fever, history of seizures (last episode many years ago, not on pharmacotherapy), history of polysubstance abuse, type 2 diabetes, history of TIAs prior to carotid endarterectomy, history of CVA on Oct 07, 2016 who is brought to the emergency department with a history of waking up from his recliner around 0300 with left facial weakness, slurred speech and left sided extremities weakness.  This was followed by nausea and an episode  of emesis   Assessment / Plan / Recommendation Clinical Impression  Clinical swallow evaluation completed with daughter at bedside. Pt alerted his RN that he was having difficulty eating his lunch meal and BSE requested. Oral motor examination reveals dense left facial weakness (CN VII). Pt presents with mild/mod oral phase dysphagia characterized by reduced bolus manipulation, anterior posterior transit, and buccal pocketing due to left facial weakness. No overt signs or symptoms of aspiration noted with presentations. Pt required cueing to decrease rate of intake and for buccal clearing. Recommend D3/mech soft with thin liquids with standard aspiration and reflux precautions due to above deficits. Pt and daughter in agreement with plan of care. SLP will follow for SLE.   SLP Visit Diagnosis: Dysphagia, oral phase (R13.11)    Aspiration Risk  Mild aspiration risk    Diet Recommendation Dysphagia 3 (Mech soft);Thin liquid   Liquid Administration via: Cup;Straw Medication Administration: Whole meds with liquid Supervision: Patient able to self feed Compensations: Small sips/bites;Slow rate;Lingual sweep for clearance of pocketing Postural Changes: Seated upright at 90 degrees;Remain upright for at least 30 minutes after po intake    Other  Recommendations Oral Care Recommendations: Oral care BID;Patient independent with oral care Other Recommendations: Clarify dietary restrictions   Follow up Recommendations Outpatient SLP      Frequency and Duration min 2x/week  1 week       Prognosis Prognosis for Safe Diet Advancement: Good Barriers to Reach Goals: Severity of deficits Barriers/Prognosis Comment: left facial  weakness      Swallow Study   General Date of Onset: 07/12/17 HPI: Ryan Butler is a 67 y.o. male with medical history significant of carotid artery disease, cervical spondylosis, history of closed head injury, essential hypertension, colon polyps, history of scarlet  fever, history of seizures (last episode many years ago, not on pharmacotherapy), history of polysubstance abuse, type 2 diabetes, history of TIAs prior to carotid endarterectomy, history of CVA on Oct 07, 2016 who is brought to the emergency department with a history of waking up from his recliner around 0300 with left facial weakness, slurred speech and left sided extremities weakness.  This was followed by nausea and an episode of emesis Type of Study: Bedside Swallow Evaluation Previous Swallow Assessment: None on record Diet Prior to this Study: Regular;Thin liquids Temperature Spikes Noted: No Respiratory Status: Room air History of Recent Intubation: No Behavior/Cognition: Alert;Cooperative;Pleasant mood Oral Cavity Assessment: Other (comment)(piece of corn found in left buccal cavity) Oral Care Completed by SLP: No Oral Cavity - Dentition: Edentulous Vision: Functional for self-feeding Self-Feeding Abilities: Able to feed self Patient Positioning: Upright in bed Baseline Vocal Quality: Normal Volitional Cough: Strong Volitional Swallow: Able to elicit    Oral/Motor/Sensory Function Overall Oral Motor/Sensory Function: Moderate impairment Facial ROM: Reduced left;Suspected CN VII (facial) dysfunction Facial Symmetry: Abnormal symmetry left;Suspected CN VII (facial) dysfunction Facial Strength: Reduced left;Suspected CN VII (facial) dysfunction Facial Sensation: Reduced left Lingual ROM: Within Functional Limits Lingual Symmetry: Within Functional Limits Lingual Strength: Within Functional Limits Lingual Sensation: Suspected CN VII (facial) dysfunction-anterior 2/3 tongue;Reduced Velum: Within Functional Limits Mandible: Within Functional Limits   Ice Chips Ice chips: Within functional limits Presentation: Spoon   Thin Liquid Thin Liquid: Within functional limits Presentation: Cup;Self Fed;Straw    Nectar Thick Nectar Thick Liquid: Not tested   Honey Thick Honey Thick Liquid:  Not tested   Puree Puree: Within functional limits Presentation: Spoon   Solid   GO   Solid: Impaired Presentation: Self Fed Oral Phase Impairments: Impaired mastication;Reduced labial seal Oral Phase Functional Implications: Left lateral sulci pocketing;Prolonged oral transit;Impaired mastication;Oral residue       Thank you,  Genene Churn, [place] 07/13/2017,1:50 PM

## 2017-07-13 NOTE — Care Management Important Message (Signed)
Important Message  Patient Details  Name: Ryan Butler MRN: [id number] Date of Birth: 01-13-1951   Medicare Important Message Given:  Yes    Sherald Barge, RN 07/13/2017, 1:12 PM

## 2017-07-13 NOTE — Progress Notes (Signed)
*  PRELIMINARY RESULTS* Echocardiogram 2D Echocardiogram has been performed.  Ryan Butler 07/13/2017, 4:08 PM

## 2017-07-13 NOTE — Progress Notes (Signed)
Patient c/o having difficulty swallowing his lunch,and this was the first time that has happened. MD notified. Orders placed. Will continue to monitor patient.

## 2017-07-13 NOTE — Plan of Care (Signed)
  No Outcome SLP Dysphagia Goals Patient will utilize recommended strategies Description Patient will utilize recommended strategies during swallow to increase swallowing safety with 07/13/2017 1356 by Ephraim Hamburger, CCC-SLP Flowsheets Taken 07/13/2017 1356  Patient will utilize recommended strategies during swallow to increase swallowing safety with  modified independence SLP Language Goals Patient will utilize speech intelligibility Description Patient will utilize speech intelligibility strategies to  enhance communication with 07/13/2017 1356 by Ephraim Hamburger, [place] Taken 07/13/2017 1356  Patient will utilize speech intelligibility strategies to enhance communication with ____ min assist   Thank you,  Genene Churn, CCC-SLP [phone]

## 2017-07-13 NOTE — Evaluation (Signed)
Speech Language Pathology Evaluation Patient Details Name: Ryan Butler MRN: [id number] DOB: 1950-12-28 Today's Date: 07/13/2017 Time: 3474-2595 SLP Time Calculation (min) (ACUTE ONLY): 19 min  Problem List:  Patient Active Problem List   Diagnosis Date Noted  . Type 2 diabetes mellitus ([place]) 07/12/2017  . Acute CVA (cerebrovascular accident) ([place]) 07/12/2017  . Polyarthralgia 01/13/2017  . CVA (cerebral vascular accident) ([place]) 01/07/2017  . PVD (peripheral vascular disease) ([place]) 11/30/2015  . Insomnia 11/30/2015  . Chronic pain disorder 11/30/2015  . Spinal stenosis of lumbar region 04/25/2015  . Spinal stenosis in cervical region 12/05/2013  . Precordial pain 09/17/2011  . TOBACCO ABUSE 03/26/2006  . Headache, unspecified headache type 03/26/2006  . Type 2 diabetes mellitus with neurological manifestations, uncontrolled ([place]) 03/24/2006  . Mixed hyperlipidemia 03/24/2006  . Essential hypertension, benign 03/24/2006  . CAROTID ENDARTERECTOMY, LEFT, HX OF 03/04/2005   Past Medical History:  Past Medical History:  Diagnosis Date  . Carotid artery disease ([place])   . Cervical spondylosis   . Closed head injury 1996  . Essential hypertension, benign   . History of colonic polyps   . History of scarlet fever   . History of seizures   . History of TIAs   . Polysubstance abuse ([place])    History of cocaine in his 30's  . Type 2 diabetes mellitus ([place])    Pt stopped metformin 09/2013   Past Surgical History:  Past Surgical History:  Procedure Laterality Date  . ANTERIOR CERVICAL DECOMP/DISCECTOMY FUSION N/A 12/05/2013   Procedure: ANTERIOR CERVICAL DECOMPRESSION/DISCECTOMY FUSION 2 LEVELS;  Surgeon: Elaina Hoops, MD;  Location: [facility];  Service: Neurosurgery;  Laterality: N/A;  Anterior Cervical Discectomy and Fusion with PEEK Cages and allograft Cervical Four-Five/Five-Six  . FRACTURE SURGERY    . Hand laceration repair    . Left carotid endarterectomy     6/06 - Dr.  Scot Dock  . skin graft to leg Left   . Tibia/fibula fracture repair    . VASCULAR SURGERY     HPI:  Ryan Butler is a 67 y.o. male with medical history significant of carotid artery disease, cervical spondylosis, history of closed head injury, essential hypertension, colon polyps, history of scarlet fever, history of seizures (last episode many years ago, not on pharmacotherapy), history of polysubstance abuse, type 2 diabetes, history of TIAs prior to carotid endarterectomy, history of CVA on Oct 07, 2016 who is brought to the emergency department with a history of waking up from his recliner around 0300 with left facial weakness, slurred speech and left sided extremities weakness.  This was followed by nausea and an episode of emesis. MRI shows right basal ganglia and cerebellar stroke.   Assessment / Plan / Recommendation Clinical Impression  Pt presents with mild cognitive linguistic deficits characterized by dysarthric speech impacting speech intelligibility at the sentence level, decreased attention and working memory. Pt was unable to complete serial 7s and reportedly is "very good with numbers" per Pt and daughter. Pt with poor awareness of reduced speech intelligibility and benefited from cues to over articulate and reduce rate of speech. Recommend outpatient SLP services to address above deficits in increase independence with communication.     SLP Assessment  SLP Recommendation/Assessment: Patient needs continued Speech Lanaguage Pathology Services SLP Visit Diagnosis: Cognitive communication deficit (R41.841);Dysarthria and anarthria (R47.1)    Follow Up Recommendations  Outpatient SLP    Frequency and Duration min 2x/week  1 week      SLP Evaluation  Cognition  Overall Cognitive Status: Within Functional Limits for tasks assessed Arousal/Alertness: Awake/alert Orientation Level: Oriented X4 Attention: Sustained Sustained Attention: Impaired Sustained Attention Impairment:  Verbal complex;Functional complex Memory: Impaired Memory Impairment: Storage deficit;Retrieval deficit Awareness: Appears intact Problem Solving: Appears intact Executive Function: Writer: Impaired Organizing Impairment: Verbal complex Safety/Judgment: Appears intact       Comprehension  Auditory Comprehension Overall Auditory Comprehension: Appears within functional limits for tasks assessed Yes/No Questions: Within Functional Limits Commands: Within Functional Limits Conversation: Complex Interfering Components: Attention Visual Recognition/Discrimination Discrimination: Not tested Reading Comprehension Reading Status: Not tested    Expression Expression Primary Mode of Expression: Verbal Verbal Expression Overall Verbal Expression: Impaired Initiation: No impairment Automatic Speech: Name;Social Response Level of Generative/Spontaneous Verbalization: Conversation Repetition: No impairment Naming: No impairment Pragmatics: No impairment Interfering Components: Speech intelligibility Non-Verbal Means of Communication: Not applicable Written Expression Dominant Hand: Right Written Expression: Not tested   Oral / Motor  Oral Motor/Sensory Function Overall Oral Motor/Sensory Function: Moderate impairment Facial ROM: Reduced left;Suspected CN VII (facial) dysfunction Facial Symmetry: Abnormal symmetry left;Suspected CN VII (facial) dysfunction Facial Strength: Reduced left;Suspected CN VII (facial) dysfunction Facial Sensation: Reduced left Lingual ROM: Within Functional Limits Lingual Symmetry: Within Functional Limits Lingual Strength: Within Functional Limits Lingual Sensation: Suspected CN VII (facial) dysfunction-anterior 2/3 tongue;Reduced Velum: Within Functional Limits Mandible: Within Functional Limits Motor Speech Overall Motor Speech: Impaired Respiration: Within functional limits Phonation: Normal Resonance: Within functional  limits Articulation: Impaired Level of Impairment: Sentence Intelligibility: Intelligibility reduced Word: 75-100% accurate Phrase: 75-100% accurate Sentence: 50-74% accurate Conversation: 50-74% accurate Motor Planning: Witnin functional limits Motor Speech Errors: Aware;Unaware Interfering Components: Inadequate dentition(has dentures but not wearing) Effective Techniques: Slow rate;Over-articulate   Thank you,  Genene Churn, [place]                     [place] 07/13/2017, 2:06 PM

## 2017-07-13 NOTE — Consult Note (Signed)
Ryan A. Merlene Laughter, MD     [email or web address]          Ryan Butler is an 67 y.o. male.   ASSESSMENT/PLAN: 1. Multiple acute/subacute infarcts involving different vascular territory suspicious for cardioembolic phenomena: A 30 day event monitor is suggested to evaluate for paroxysmal atrial fibrillation. Short-term duration of dual antiplatelet agents I recommended. Aspirin 162 mg for one month is suggested admission to his baseline Plavix. Physical, occupational and speech therapies are recommended.   2. Questionable bilateral stenosis involving the intracranial internal carotid arteries at the levels of the cavernous sinus. Symmetric involvement raises the suspicion of likely artifact.    Patient is 67 year old right-handed white male who presents with the acute onset of left upper extremity weakness and facial asymmetry along with nausea and vomiting. The patient has me that he had a stroke last year which presented with left upper extremity weakness. He tells me that the left upper extremity recovered after several months. He reports he has been compliant with treatments since then. He does not report having symptoms involving the left lower extremity. The epicenter of his weakness seems to be the left hand. He does not report having palpitation or dyspnea. He reports having mild right shoulder right chest discomfort. The review systems otherwise negative.    GENERAL: This a very pleasant male who is in no acute distress.  HEENT: Normal  ABDOMEN: soft  EXTREMITIES: No edema   BACK: Normal  SKIN: Normal by inspection.    MENTAL STATUS: Alert and oriented. Speech - moderately dysarthric; language and cognition are generally intact. Judgment and insight normal.   CRANIAL NERVES: Pupils are equal, round and reactive to light and accomodation; extra ocular movements are full, there is no significant nystagmus; there is a dense left homonymous hemianopia;  there is total weakness of the left lower facial muscles;  tongue is midline; uvula is midline; shoulder elevation is normal.  MOTOR: There is a mild drift of the left upper extremity. The left hand is markedly weak graded as 1/5; wrist extension 4/5, triceps 5 and biceps 5. There'll extremity shows normal tone bulk and strength including the left hip flexion and left dorsiflexion. There is mild drift of the left upper extremity. No drift of the other extremities.  COORDINATION: Left finger to nose is normal, right finger to nose is normal, No rest tremor; no intention tremor; no postural tremor; no bradykinesia.  REFLEXES: Deep tendon reflexes are symmetrical and normal upper extremities was markedly reduced/trace in the legs. Plantar reflexes are flexor bilaterally.   SENSATION: Normal to light touch, temperature, and pain. The patient does not extinguish to double simultaneous stimulation.    NIH stroke scale 7.     Blood pressure (!) 142/76, pulse 77, temperature 98.8 F (37.1 C), temperature source Oral, resp. rate 18, height 5' 10"  (1.778 m), weight 157 lb 3 oz (71.3 kg), SpO2 98 %.  Past Medical History:  Diagnosis Date  . Carotid artery disease ([place])   . Cervical spondylosis   . Closed head injury 1996  . Essential hypertension, benign   . History of colonic polyps   . History of scarlet fever   . History of seizures   . History of TIAs   . Polysubstance abuse ([place])    History of cocaine in his 30's  . Type 2 diabetes mellitus ([place])    Pt stopped metformin 09/2013    Past Surgical History:  Procedure Laterality Date  . ANTERIOR CERVICAL  DECOMP/DISCECTOMY FUSION N/A 12/05/2013   Procedure: ANTERIOR CERVICAL DECOMPRESSION/DISCECTOMY FUSION 2 LEVELS;  Surgeon: Elaina Hoops, MD;  Location: [facility];  Service: Neurosurgery;  Laterality: N/A;  Anterior Cervical Discectomy and Fusion with PEEK Cages and allograft Cervical Four-Five/Five-Six  . FRACTURE SURGERY    . Hand  laceration repair    . Left carotid endarterectomy     6/06 - Dr. Scot Dock  . skin graft to leg Left   . Tibia/fibula fracture repair    . VASCULAR SURGERY      Family History  Problem Relation Age of Onset  . Cancer Father   . Coronary artery disease Mother     Social History:  reports that he has quit smoking. His smoking use included cigarettes. He has a 55.00 pack-year smoking history. he has never used smokeless tobacco. He reports that he uses drugs. Drugs: Marijuana and Oxycodone. He reports that he does not drink alcohol.  Allergies:  Allergies  Allergen Reactions  . Aspirin Nausea And Vomiting  . Hydrocodone Nausea Only    Medications: Prior to Admission medications   Medication Sig Start Date End Date Taking? Authorizing Provider  amitriptyline (ELAVIL) 150 MG tablet TAKE ONE TABLET BY MOUTH AT BEDTIME AS NEEDED FOR SLEEP 01/07/17  Yes [place], Betty G, MD  amLODipine (NORVASC) 5 MG tablet Take 1 tablet (5 mg total) by mouth daily. 01/08/17  Yes Reyne Dumas, MD  atorvastatin (LIPITOR) 40 MG tablet Take 1 tablet (40 mg total) by mouth daily at 6 PM. 01/08/17  Yes Reyne Dumas, MD  clopidogrel (PLAVIX) 75 MG tablet Take 1 tablet (75 mg total) by mouth daily with breakfast. 01/09/17  Yes Reyne Dumas, MD  glipiZIDE (GLUCOTROL) 5 MG tablet Take 1 tablet (5 mg total) by mouth daily before breakfast. 12/24/15  Yes [place], Betty G, MD  losartan (COZAAR) 25 MG tablet Take 1 tablet (25 mg total) by mouth daily. 01/08/17  Yes Reyne Dumas, MD  metFORMIN (GLUCOPHAGE-XR) 500 MG 24 hr tablet Take 1 tablet (500 mg total) by mouth daily with breakfast. 01/13/17  Yes [place], Betty G, MD  OVER THE COUNTER MEDICATION Take by mouth daily as needed (Kratom powder mixed in liquid and drank as needed for back pain).   Yes [provider]  ACCU-CHEK AVIVA PLUS test strip USE ONE STRIP TO CHECK GLUCOSE TWICE DAILY 06/28/17   [place], Betty G, MD  ACCU-CHEK SOFTCLIX LANCETS lancets USE ONE  LANCET TWICE DAILY AS DIRECTED TO CHECK BLOOD GLUCOSE 06/28/17   [place], Betty G, MD    Scheduled Meds: . atorvastatin  40 mg Oral q1800  . clopidogrel  75 mg Oral Q breakfast  . enoxaparin (LOVENOX) injection  40 mg Subcutaneous Q24H  . famotidine  20 mg Oral BID  . glipiZIDE  5 mg Oral QAC breakfast  . insulin aspart  0-9 Units Subcutaneous TID WC  . metFORMIN  500 mg Oral Q breakfast   Continuous Infusions: PRN Meds:.acetaminophen **OR** acetaminophen (TYLENOL) oral liquid 160 mg/5 mL **OR** acetaminophen, amitriptyline, hydrALAZINE, ondansetron **OR** ondansetron (ZOFRAN) IV, senna-docusate     Results for orders placed or performed during the hospital encounter of 07/12/17 (from the past 48 hour(s))  CBC with Differential     Status: Abnormal   Collection Time: 07/12/17  6:31 PM  Result Value Ref Range   WBC 9.0 4.0 - 10.5 K/uL   RBC 4.27 4.22 - 5.81 MIL/uL   Hemoglobin 13.1 13.0 - 17.0 g/dL   HCT 38.6 (L)  39.0 - 52.0 %   MCV 90.4 78.0 - 100.0 fL   MCH 30.7 26.0 - 34.0 pg   MCHC 33.9 30.0 - 36.0 g/dL   RDW 13.3 11.5 - 15.5 %   Platelets 150 150 - 400 K/uL   Neutrophils Relative % 66 %   Neutro Abs 5.9 1.7 - 7.7 K/uL   Lymphocytes Relative 23 %   Lymphs Abs 2.1 0.7 - 4.0 K/uL   Monocytes Relative 10 %   Monocytes Absolute 0.9 0.1 - 1.0 K/uL   Eosinophils Relative 1 %   Eosinophils Absolute 0.1 0.0 - 0.7 K/uL   Basophils Relative 0 %   Basophils Absolute 0.0 0.0 - 0.1 K/uL    Comment: Performed at [facility], [address] [place], [place] [address]  Basic metabolic panel     Status: Abnormal   Collection Time: 07/12/17  6:31 PM  Result Value Ref Range   Sodium 139 135 - 145 mmol/L   Potassium 3.1 (L) 3.5 - 5.1 mmol/L   Chloride 104 101 - 111 mmol/L   CO2 24 22 - 32 mmol/L   Glucose, Bld 193 (H) 65 - 99 mg/dL   BUN 28 (H) 6 - 20 mg/dL   Creatinine, Ser 1.55 (H) 0.61 - 1.24 mg/dL   Calcium 9.3 8.9 - 10.3 mg/dL   GFR calc non Af Amer 44 (L) >60 mL/min   GFR  calc Af Amer 51 (L) >60 mL/min    Comment: (NOTE) The eGFR has been calculated using the CKD EPI equation. This calculation has not been validated in all clinical situations. eGFR's persistently <60 mL/min signify possible Chronic Kidney Disease.    Anion gap 11 5 - 15    Comment: Performed at [facility], [address], [place], [place] [address]  Magnesium     Status: None   Collection Time: 07/12/17  6:31 PM  Result Value Ref Range   Magnesium 1.8 1.7 - 2.4 mg/dL    Comment: Performed at [facility], [address], [place], [place] [address]  Phosphorus     Status: None   Collection Time: 07/12/17  6:31 PM  Result Value Ref Range   Phosphorus 2.7 2.5 - 4.6 mg/dL    Comment: Performed at [facility], [address], [place], [place] [address]  Glucose, capillary     Status: Abnormal   Collection Time: 07/12/17  9:54 PM  Result Value Ref Range   Glucose-Capillary 132 (H) 65 - 99 mg/dL   Comment 1 Notify RN    Comment 2 Document in Chart   Hemoglobin A1c     Status: Abnormal   Collection Time: 07/13/17  7:28 AM  Result Value Ref Range   Hgb A1c MFr Bld 7.7 (H) 4.8 - 5.6 %    Comment: (NOTE) Pre diabetes:          5.7%-6.4% Diabetes:              >6.4% Glycemic control for   <7.0% adults with diabetes    Mean Plasma Glucose 174.29 mg/dL    Comment: Performed at [facility] [address], [place],  [address]  Lipid panel     Status: Abnormal   Collection Time: 07/13/17  7:28 AM  Result Value Ref Range   Cholesterol 126 0 - 200 mg/dL   Triglycerides 102 <150 mg/dL   HDL 39 (L) >40 mg/dL   Total CHOL/HDL Ratio 3.2 RATIO   VLDL 20 0 - 40 mg/dL  LDL Cholesterol 67 0 - 99 mg/dL    Comment:        Total Cholesterol/HDL:CHD Risk Coronary Heart Disease Risk Table                     Men   Women  1/2 Average Risk   3.4   3.3  Average Risk       5.0   4.4  2 X Average Risk   9.6   7.1  3 X Average Risk  23.4   11.0        Use the calculated Patient  Ratio above and the CHD Risk Table to determine the patient's CHD Risk.        ATP III CLASSIFICATION (LDL):  <100     mg/dL   Optimal  100-129  mg/dL   Near or Above                    Optimal  130-159  mg/dL   Borderline  160-189  mg/dL   High  >190     mg/dL   Very High Performed at [facility], [address], [place], [place] [address]   Glucose, capillary     Status: Abnormal   Collection Time: 07/13/17  7:46 AM  Result Value Ref Range   Glucose-Capillary 121 (H) 65 - 99 mg/dL   Comment 1 Notify RN    Comment 2 Document in Chart   Glucose, capillary     Status: Abnormal   Collection Time: 07/13/17 11:18 AM  Result Value Ref Range   Glucose-Capillary 155 (H) 65 - 99 mg/dL  Glucose, capillary     Status: None   Collection Time: 07/13/17  4:31 PM  Result Value Ref Range   Glucose-Capillary 89 65 - 99 mg/dL    Studies/Results:  ECHO - Left ventricle: The cavity size was normal. Wall thickness was   normal. Systolic function was normal. The estimated ejection   fraction was in the range of 60% to 65%. Doppler parameters are   consistent with abnormal left ventricular relaxation (grade 1   diastolic dysfunction).      CAROTID   IMPRESSION: Less than 50% stenosis in the right and left internal carotid arteries.     BRAIN MRI MRA FINDINGS: MRI HEAD FINDINGS  Brain: Multifocal moderate to large area of cortically based infarct in the right posterior frontal and occipital parietal region. There is also patchy acute infarction in the right putamen, internal capsule, and caudate nucleus.  No hemorrhage, hydrocephalus, or masslike finding. Minor chronic small vessel ischemic type change in the periventricular white matter.  Punctate acute infarct in the right cerebellum.  Asymmetric increased DWI signal of the right hippocampus.  Vascular: Arterial findings below. Normal dural venous sinus flow voids.  Skull and upper cervical spine: No evidence  of marrow lesion. Asymmetric right cervical facet arthropathy. C4-5 ACDF.  Sinuses/Orbits: Negative  MRA HEAD FINDINGS  Advanced intracranial atherosclerosis. No acute finding when compared to prior.  Extensive atherosclerotic irregularity of the bilateral carotid siphons with severe stenosis of the right mid cavernous segment where there is flow gap. There is moderate left cavernous narrowing. Moderate narrowing of the left V4 segment. Advanced narrowing of left distal P2 and right proximal P3 segments. Moderate narrowing of left M2 branches. Moderate narrowing of proximal right pica. Mild narrowing of the mid basilar.  IMPRESSION: 1. Multifocal acute infarction along the right MCA and border zone cortex. Multifocal acute right  basal ganglia infarct. Infarcts in this distribution were also seen 01/07/2017. 2. Small acute infarct in the right cerebellum. 3. Restricted diffusion in the right hippocampus, please correlate for seizure activity. 4. Stable intracranial MRA with advanced atherosclerosis. Most notable in this setting is a severe right ICA cavernous segment stenosis with flow gap. Additional narrowings described above.      The brain MRI and MRA are both reviewed in person.  Increased signal is seen on DWI a somewhat patchy intensity most consistent with subacute infarct.  It is not the typical bright signal seen.  Areas involve are multiple distribution involving both  MCA and PCA territories.  There about four cortical areas involved which are the right occipital, right temporal, right frontal and right parietal regions.  The areas the involve her mostly the be gray matter.  There also to deeper areas involving cleaning the right basal ganglia and area adjacent to the frontal horn of the lateral ventricle on the right side.  MRA is relatively unremarkable although there is a dropout signal involving the cavernous sinus bilaterally with a dropper signal on both sides.   The drop also knows worse on the right side.This is an area where artifact is common especially since the dropout signal are seen on both sides.         Bambi Fehnel A. Merlene Butler, M.D.  Diplomate, Tax adviser of Psychiatry and Neurology ( Neurology). 07/13/2017, 7:36 PM

## 2017-07-13 NOTE — Progress Notes (Signed)
Unable to do Neuro checks and vital signs at this time $RemoveB'@[id number]'aToVAEYD$  ,patient is having a 2-Decho. Will continue to monitor patient.

## 2017-07-13 NOTE — Progress Notes (Signed)
Patient off unit for MRI at time VS and neuro check are due.  Will complete when patient returns to floor

## 2017-07-13 NOTE — Progress Notes (Signed)
PROGRESS NOTE    HERNDON GRILL  GUY:[id number] DOB: 23-Feb-1951 DOA: 07/12/2017 PCP: Celene Squibb, MD   Brief Narrative:   This is a 67 year old male with history significant for CAD with prior endarterectomy, prior closed head injury, hypertension, seizures, TIAs and CVA who was brought to the ED after he woke up at 3 AM with left-sided weakness, slurred speech and facial droop.  This was followed by an episode of nausea and emesis.  He has been noted to have a right MCA distribution as well as right basal ganglia CVA on MRI/MRA.  He appears to be comfortable this morning and has been seen by speech therapy with recommendations for dysphagia 3 diet.  Assessment & Plan:   Principal Problem:   Acute CVA (cerebrovascular accident) ([place]) Active Problems:   Mixed hyperlipidemia   Essential hypertension, benign   Headache, unspecified headache type   CAROTID ENDARTERECTOMY, LEFT, HX OF   PVD (peripheral vascular disease) ([facility])   Insomnia   Chronic pain disorder   Type 2 diabetes mellitus ([place])   1. Acute right MCA distribution and right basal ganglia CVA-suspicious for cardioembolic CVA.  Carotid Dopplers and 2D echocardiogram pending.  Continue on Plavix as recommended by tele-neurology.  Neurology evaluation pending.  Appreciate PT, OT, and speech further recommendations.  Continue on dysphagia diet with outpatient PT on discharge. 2. Hypertension-well controlled.  Continue to monitor closely and allow for permissive hypertension. 3. Mixed hyperlipidemia.  Continue atorvastatin. 4. History of carotid artery disease with peripheral vascular disease.  Continue Plavix and statin. 5. Insomnia.  Continue amitriptyline as needed. 6. Chronic pain disorder secondary to cervical spondylosis.  Continue pain medications as ordered. 7. Type 2 diabetes.  Continue on home oral hypoglycemic agents. HgA1c 7.7% and appears stable.   DVT prophylaxis:Lovenox Code Status: Full Family Communication:  Daughter Programme researcher, broadcasting/film/video) at bedside Disposition Plan: CVA workup and Neuro evaluation pending; recommended outpatient PT   Consultants:   Neurology  Procedures:   None  Antimicrobials:   None   Subjective: Patient seen and evaluated today with no new acute complaints or concerns. No acute concerns or events noted overnight.  Objective: Vitals:   07/13/17 0530 07/13/17 0930 07/13/17 1130 07/13/17 1330  BP: (!) 133/50 (!) 149/77 (!) 154/62 (!) 169/74  Pulse: 61 77 66 82  Resp: $Remo'18 18 19 19  'JZQnS$ Temp: 98.3 F (36.8 C) 98.1 F (36.7 C) 98.2 F (36.8 C) 97.7 F (36.5 C)  TempSrc: Oral  Oral Oral  SpO2: 100% 99% 100% 100%  Weight:      Height:        Intake/Output Summary (Last 24 hours) at 07/13/2017 1549 Last data filed at 07/13/2017 1516 Gross per 24 hour  Intake 635.83 ml  Output -  Net 635.83 ml   Filed Weights   07/12/17 1751 07/12/17 2130  Weight: 70.3 kg (155 lb) 71.3 kg (157 lb 3 oz)    Examination:  General exam: Appears calm and comfortable  Respiratory system: Clear to auscultation. Respiratory effort normal. Cardiovascular system: S1 & S2 heard, RRR. No JVD, murmurs, rubs, gallops or clicks. No pedal edema. Gastrointestinal system: Abdomen is nondistended, soft and nontender. No organomegaly or masses felt. Normal bowel sounds heard. Central nervous system: Alert and oriented. No focal neurological deficits. Extremities: L sided weakness> right Skin: No rashes, lesions or ulcers Psychiatry: Judgement and insight appear normal. Mood & affect appropriate.     Data Reviewed: I have personally reviewed following labs and imaging studies  CBC:  Recent Labs  Lab 07/12/17 1831  WBC 9.0  NEUTROABS 5.9  HGB 13.1  HCT 38.6*  MCV 90.4  PLT 629   Basic Metabolic Panel: Recent Labs  Lab 07/12/17 1831  NA 139  K 3.1*  CL 104  CO2 24  GLUCOSE 193*  BUN 28*  CREATININE 1.55*  CALCIUM 9.3  MG 1.8  PHOS 2.7   GFR: Estimated Creatinine Clearance: 45.4  mL/min (A) (by C-G formula based on SCr of 1.55 mg/dL (H)). Liver Function Tests: No results for input(s): AST, ALT, ALKPHOS, BILITOT, PROT, ALBUMIN in the last 168 hours. No results for input(s): LIPASE, AMYLASE in the last 168 hours. No results for input(s): AMMONIA in the last 168 hours. Coagulation Profile: No results for input(s): INR, PROTIME in the last 168 hours. Cardiac Enzymes: No results for input(s): CKTOTAL, CKMB, CKMBINDEX, TROPONINI in the last 168 hours. BNP (last 3 results) No results for input(s): PROBNP in the last 8760 hours. HbA1C: Recent Labs    07/13/17 0728  HGBA1C 7.7*   CBG: Recent Labs  Lab 07/12/17 2154 07/13/17 0746 07/13/17 1118  GLUCAP 132* 121* 155*   Lipid Profile: Recent Labs    07/13/17 0728  CHOL 126  HDL 39*  LDLCALC 67  TRIG 102  CHOLHDL 3.2   Thyroid Function Tests: No results for input(s): TSH, T4TOTAL, FREET4, T3FREE, THYROIDAB in the last 72 hours. Anemia Panel: No results for input(s): VITAMINB12, FOLATE, FERRITIN, TIBC, IRON, RETICCTPCT in the last 72 hours. Sepsis Labs: No results for input(s): PROCALCITON, LATICACIDVEN in the last 168 hours.  No results found for this or any previous visit (from the past 240 hour(s)).       Radiology Studies: Dg Chest 2 View  Result Date: 07/13/2017 CLINICAL DATA:  67 year old male with acute CVA.  Fall yesterday. EXAM: CHEST  2 VIEW COMPARISON:  01/07/2017 and prior chest radiographs FINDINGS: The cardiomediastinal silhouette is unremarkable. There is no evidence of focal airspace disease, pulmonary edema, suspicious pulmonary nodule/mass, pleural effusion, or pneumothorax. No acute bony abnormalities are identified. Cervical spine fusion hardware again noted. IMPRESSION: No active cardiopulmonary disease. Electronically Signed   By: Margarette Canada M.D.   On: 07/13/2017 08:58   Ct Head Wo Contrast  Result Date: 07/12/2017 CLINICAL DATA:  Fall left-sided arm weakness, left facial droop  EXAM: CT HEAD WITHOUT CONTRAST TECHNIQUE: Contiguous axial images were obtained from the base of the skull through the vertex without intravenous contrast. COMPARISON:  MRI 01/07/2017, CT brain 10/25/2013 FINDINGS: Brain: No hemorrhage or intracranial mass is visualized. Multifocal hypodensity visualized within the right frontal lobe and right parietooccipital lobes. Additional mild hypodensity in the right basal ganglia compared to prior. Mild atrophy. Normal ventricle size. No midline shift. Vascular: No hyperdense vessels. Vertebral artery and carotid vessel calcification Skull: No fracture or suspicious lesion Sinuses/Orbits: No acute finding. Other: None IMPRESSION: 1. Multifocal hypodensities involving the right frontal and parietooccipital lobes with additional age indeterminate hypodensity in the right basal ganglia. Findings are concerning for multifocal areas of edema/possible acute to subacute infarcts; further evaluation with MRI is recommended. No hemorrhage or focal mass lesion is seen. Electronically Signed   By: Donavan Foil M.D.   On: 07/12/2017 19:04   Mr Brain Wo Contrast  Result Date: 07/13/2017 CLINICAL DATA:  Stroke follow-up.  Left-sided weakness for 2-3 days. EXAM: MRI HEAD WITHOUT CONTRAST MRA HEAD WITHOUT CONTRAST TECHNIQUE: Multiplanar, multiecho pulse sequences of the brain and surrounding structures were obtained without intravenous contrast. Angiographic images  of the head were obtained using MRA technique without contrast. COMPARISON:  Head CT from yesterday.  Brain MRI 01/07/2017 FINDINGS: MRI HEAD FINDINGS Brain: Multifocal moderate to large area of cortically based infarct in the right posterior frontal and occipital parietal region. There is also patchy acute infarction in the right putamen, internal capsule, and caudate nucleus. No hemorrhage, hydrocephalus, or masslike finding. Minor chronic small vessel ischemic type change in the periventricular white matter. Punctate  acute infarct in the right cerebellum. Asymmetric increased DWI signal of the right hippocampus. Vascular: Arterial findings below. Normal dural venous sinus flow voids. Skull and upper cervical spine: No evidence of marrow lesion. Asymmetric right cervical facet arthropathy. C4-5 ACDF. Sinuses/Orbits: Negative MRA HEAD FINDINGS Advanced intracranial atherosclerosis. No acute finding when compared to prior. Extensive atherosclerotic irregularity of the bilateral carotid siphons with severe stenosis of the right mid cavernous segment where there is flow gap. There is moderate left cavernous narrowing. Moderate narrowing of the left V4 segment. Advanced narrowing of left distal P2 and right proximal P3 segments. Moderate narrowing of left M2 branches. Moderate narrowing of proximal right pica. Mild narrowing of the mid basilar. IMPRESSION: 1. Multifocal acute infarction along the right MCA and border zone cortex. Multifocal acute right basal ganglia infarct. Infarcts in this distribution were also seen 01/07/2017. 2. Small acute infarct in the right cerebellum. 3. Restricted diffusion in the right hippocampus, please correlate for seizure activity. 4. Stable intracranial MRA with advanced atherosclerosis. Most notable in this setting is a severe right ICA cavernous segment stenosis with flow gap. Additional narrowings described above. Electronically Signed   By: Monte Fantasia M.D.   On: 07/13/2017 08:36   US Carotid Bilateral (at [facility] And [facility] Only)  Result Date: 07/13/2017 CLINICAL DATA:  Acute CVA. Left facial weakness for 1 day. History of left carotid endarterectomy. EXAM: BILATERAL CAROTID DUPLEX ULTRASOUND TECHNIQUE: Pearline Cables scale imaging, color Doppler and duplex ultrasound were performed of bilateral carotid and vertebral arteries in the neck. COMPARISON:  01/07/2017 FINDINGS: Criteria: Quantification of carotid stenosis is based on velocity parameters that correlate the residual internal carotid diameter  with NASCET-based stenosis levels, using the diameter of the distal internal carotid lumen as the denominator for stenosis measurement. The following velocity measurements were obtained: RIGHT ICA:  93 cm/sec CCA:  993 cm/sec SYSTOLIC ICA/CCA RATIO:  0.9 DIASTOLIC ICA/CCA RATIO:  1.0 ECA:  169 cm/sec LEFT ICA:  91 cm/sec CCA:  98 cm/sec SYSTOLIC ICA/CCA RATIO:  0.9 DIASTOLIC ICA/CCA RATIO:  1.7 ECA:  64 cm/sec RIGHT CAROTID ARTERY: Mild smooth mixed plaque along the bulb wall. Low resistance internal carotid Doppler pattern. RIGHT VERTEBRAL ARTERY:  Antegrade. LEFT CAROTID ARTERY: Mild smooth soft plaque along the wall of the bulb. Low resistance internal carotid Doppler pattern. LEFT VERTEBRAL ARTERY:  Antegrade. IMPRESSION: Less than 50% stenosis in the right and left internal carotid arteries. Electronically Signed   By: Marybelle Killings M.D.   On: 07/13/2017 12:38   Mr Jodene Nam Head/brain ZJ Cm  Result Date: 07/13/2017 CLINICAL DATA:  Stroke follow-up.  Left-sided weakness for 2-3 days. EXAM: MRI HEAD WITHOUT CONTRAST MRA HEAD WITHOUT CONTRAST TECHNIQUE: Multiplanar, multiecho pulse sequences of the brain and surrounding structures were obtained without intravenous contrast. Angiographic images of the head were obtained using MRA technique without contrast. COMPARISON:  Head CT from yesterday.  Brain MRI 01/07/2017 FINDINGS: MRI HEAD FINDINGS Brain: Multifocal moderate to large area of cortically based infarct in the right posterior frontal and occipital parietal region. There  is also patchy acute infarction in the right putamen, internal capsule, and caudate nucleus. No hemorrhage, hydrocephalus, or masslike finding. Minor chronic small vessel ischemic type change in the periventricular white matter. Punctate acute infarct in the right cerebellum. Asymmetric increased DWI signal of the right hippocampus. Vascular: Arterial findings below. Normal dural venous sinus flow voids. Skull and upper cervical spine: No  evidence of marrow lesion. Asymmetric right cervical facet arthropathy. C4-5 ACDF. Sinuses/Orbits: Negative MRA HEAD FINDINGS Advanced intracranial atherosclerosis. No acute finding when compared to prior. Extensive atherosclerotic irregularity of the bilateral carotid siphons with severe stenosis of the right mid cavernous segment where there is flow gap. There is moderate left cavernous narrowing. Moderate narrowing of the left V4 segment. Advanced narrowing of left distal P2 and right proximal P3 segments. Moderate narrowing of left M2 branches. Moderate narrowing of proximal right pica. Mild narrowing of the mid basilar. IMPRESSION: 1. Multifocal acute infarction along the right MCA and border zone cortex. Multifocal acute right basal ganglia infarct. Infarcts in this distribution were also seen 01/07/2017. 2. Small acute infarct in the right cerebellum. 3. Restricted diffusion in the right hippocampus, please correlate for seizure activity. 4. Stable intracranial MRA with advanced atherosclerosis. Most notable in this setting is a severe right ICA cavernous segment stenosis with flow gap. Additional narrowings described above. Electronically Signed   By: Monte Fantasia M.D.   On: 07/13/2017 08:36        Scheduled Meds: . atorvastatin  40 mg Oral q1800  . clopidogrel  75 mg Oral Q breakfast  . enoxaparin (LOVENOX) injection  40 mg Subcutaneous Q24H  . famotidine  20 mg Oral BID  . glipiZIDE  5 mg Oral QAC breakfast  . insulin aspart  0-9 Units Subcutaneous TID WC  . metFORMIN  500 mg Oral Q breakfast   Continuous Infusions: . 0.9 % NaCl with KCl 40 mEq / L 50 mL/hr (07/13/17 0233)     LOS: 1 day    Time spent: 30 minutes    Pratik Darleen Crocker, DO [facility] Pager [phone]  If 7PM-7AM, please contact night-coverage [email or web address] Password [facility] 07/13/2017, 3:49 PM

## 2017-07-13 NOTE — Evaluation (Signed)
Physical Therapy Evaluation Patient Details Name: Ryan Butler MRN: [id number] DOB: [birth year] Today's Date: 07/13/2017   History of Present Illness  Ryan Butler is a 67 y.o. male with medical history significant of carotid artery disease, cervical spondylosis, history of closed head injury, essential hypertension, colon polyps, history of scarlet fever, history of seizures (last episode many years ago, not on pharmacotherapy), history of polysubstance abuse, type 2 diabetes, history of TIAs prior to carotid endarterectomy, history of CVA on Oct 07, 2016 who is brought to the emergency department with a history of waking up from his recliner around 0300 with left facial weakness, slurred speech and left sided extremities weakness.  This was followed by nausea and an episode of emesis.      Clinical Impression  Patient functioning at baseline other than weakness LUE below elbow, limited grip strength left hand and weakness in wrist which can be addressed by occupational therapy (OT) while in hospital and as an outpatient.  If OT not available for out patient, physical therapy can also work with LUE as an outpatient.  PLAN:  Discharge patient from physical therapy to nursing staff for ambulation ad lib for length of stay.    Follow Up Recommendations Outpatient PT    Equipment Recommendations  None recommended by PT    Recommendations for Other Services       Precautions / Restrictions Precautions Precautions: None Restrictions Weight Bearing Restrictions: No      Mobility  Bed Mobility Overal bed mobility: Independent                Transfers Overall transfer level: Independent                  Ambulation/Gait Ambulation/Gait assistance: Independent Ambulation Distance (Feet): 150 Feet Assistive device: None Gait Pattern/deviations: WFL(Within Functional Limits)   Gait velocity interpretation: at or above normal speed for age/gender    Stairs Stairs:  Yes Stairs assistance: Modified independent (Device/Increase time) Stair Management: One rail Right;One rail Left;Alternating pattern Number of Stairs: 9 General stair comments: Patient demonstrates good return for going up/down 9 steps without loss of balance  Wheelchair Mobility    Modified Rankin (Stroke Patients Only)       Balance Overall balance assessment: No apparent balance deficits (not formally assessed)                                           Pertinent Vitals/Pain Pain Assessment: 0-10 Pain Score: 8  Pain Location: chronic low back pain Pain Descriptors / Indicators: Aching;Discomfort Pain Intervention(s): Limited activity within patient's tolerance;Monitored during session    Home Living Family/patient expects to be discharged to:: Private residence Living Arrangements: Non-relatives/Friends Available Help at Discharge: Family;Friend(s);Available 24 hours/day Type of Home: Mobile home Home Access: Stairs to enter Entrance Stairs-Rails: Left Entrance Stairs-Number of Steps: 3 Home Layout: One level Home Equipment: Walker - 2 wheels;Cane - single point      Prior Function Level of Independence: Independent               Hand Dominance   Dominant Hand: Right    Extremity/Trunk Assessment   Upper Extremity Assessment Upper Extremity Assessment: Defer to OT evaluation    Lower Extremity Assessment Lower Extremity Assessment: Overall WFL for tasks assessed    Cervical / Trunk Assessment Cervical / Trunk Assessment: Normal  Communication   Communication:  No difficulties  Cognition Arousal/Alertness: Awake/alert Behavior During Therapy: WFL for tasks assessed/performed Overall Cognitive Status: Within Functional Limits for tasks assessed                                        General Comments      Exercises     Assessment/Plan    PT Assessment All further PT needs can be met in the next venue of  care  PT Problem List         PT Treatment Interventions      PT Goals (Current goals can be found in the Care Plan section)  Acute Rehab PT Goals Patient Stated Goal: return home PT Goal Formulation: With patient/family Time For Goal Achievement: 08/11/17 Potential to Achieve Goals: Good    Frequency     Barriers to discharge        Co-evaluation               AM-PAC PT "6 Clicks" Daily Activity  Outcome Measure Difficulty turning over in bed (including adjusting bedclothes, sheets and blankets)?: None Difficulty moving from lying on back to sitting on the side of the bed? : None Difficulty sitting down on and standing up from a chair with arms (e.g., wheelchair, bedside commode, etc,.)?: None Help needed moving to and from a bed to chair (including a wheelchair)?: None Help needed walking in hospital room?: None Help needed climbing 3-5 steps with a railing? : None 6 Click Score: 24    End of Session   Activity Tolerance: Patient tolerated treatment well Patient left: in chair;with call bell/phone within reach;with family/visitor present Nurse Communication: Mobility status PT Visit Diagnosis: Unsteadiness on feet (R26.81);Other abnormalities of gait and mobility (R26.89);Muscle weakness (generalized) (M62.81)    Time: 2831-5176 PT Time Calculation (min) (ACUTE ONLY): 22 min   Charges:   PT Evaluation $PT Eval Low Complexity: 1 Low PT Treatments $Gait Training: 8-22 mins   PT G Codes:        12:08 PM, 08-11-17 Ryan Butler, MPT Physical Therapist with [facility] 336 [phone] office [phone] mobile phone

## 2017-07-13 NOTE — Progress Notes (Signed)
OT Cancellation Note  Patient Details Name: SAVIO ALBRECHT MRN: [id number] DOB: 1951/03/26   Cancelled Treatment:    Reason Eval/Treat Not Completed: Patient at procedure or test/ unavailable   Ailene Ravel, OTR/L,CBIS  [phone]  07/13/2017, 9:16 AM

## 2017-07-14 LAB — BASIC METABOLIC PANEL
Anion gap: 9 (ref 5–15)
BUN: 17 mg/dL (ref 6–20)
CO2: 24 mmol/L (ref 22–32)
CREATININE: 0.74 mg/dL (ref 0.61–1.24)
Calcium: 9 mg/dL (ref 8.9–10.3)
Chloride: 107 mmol/L (ref 101–111)
GFR calc Af Amer: >60 mL/min (ref >60)
GLUCOSE: 116 mg/dL — AB (ref 65–99)
Potassium: 3.2 mmol/L — ABNORMAL LOW (ref 3.5–5.1)
SODIUM: 140 mmol/L (ref 135–145)

## 2017-07-14 LAB — CBC
HEMATOCRIT: 37.5 % — AB (ref 39.0–52.0)
Hemoglobin: 12.7 g/dL — ABNORMAL LOW (ref 13.0–17.0)
MCH: 30.8 pg (ref 26.0–34.0)
MCHC: 33.9 g/dL (ref 30.0–36.0)
MCV: 90.8 fL (ref 78.0–100.0)
PLATELETS: 138 10*3/uL — AB (ref 150–400)
RBC: 4.13 MIL/uL — ABNORMAL LOW (ref 4.22–5.81)
RDW: 13.2 % (ref 11.5–15.5)
WBC: 5.7 10*3/uL (ref 4.0–10.5)

## 2017-07-14 LAB — GLUCOSE, CAPILLARY
GLUCOSE-CAPILLARY: 130 mg/dL — AB (ref 65–99)
Glucose-Capillary: 155 mg/dL — ABNORMAL HIGH (ref 65–99)

## 2017-07-14 MED ORDER — POTASSIUM CHLORIDE 20 MEQ/15ML (10%) PO SOLN
40.0000 meq | Freq: Once | ORAL | Status: AC
  Administered 2017-07-14: 40 meq via ORAL
  Filled 2017-07-14: qty 30

## 2017-07-14 NOTE — Progress Notes (Signed)
Reviewed discharge instructions with patient and his daughter at bedside. AVS given, no new prescriptions at this time. Verbalized understanding of instructions, stroke education, f/u appointments and home meds via teachback. IV site d/c'd, site within normal limits. Pt verbalized understanding of outpatient referrals and understanding of home cardiac monitor as well. States "i've worn them before and will call if any questions."  Daughter Lenna Sciara states she will make sure he has follow-up with PCP and neurology as instructed. pt in stable condition with daughter at bedside awaiting wheelchair and nursing staff assist for discharge home. Donavan Foil, RN

## 2017-07-14 NOTE — Evaluation (Signed)
Occupational Therapy Evaluation Patient Details Name: Ryan Butler MRN: [id number] DOB: 1950-12-31 Today's Date: 07/14/2017    History of Present Illness Ryan Butler is a 67 y.o. male with medical history significant of carotid artery disease, cervical spondylosis, history of closed head injury, essential hypertension, colon polyps, history of scarlet fever, history of seizures (last episode many years ago, not on pharmacotherapy), history of polysubstance abuse, type 2 diabetes, history of TIAs prior to carotid endarterectomy, history of CVA on Oct 07, 2016 who is brought to the emergency department with a history of waking up from his recliner around 0300 with left facial weakness, slurred speech and left sided extremities weakness.  This was followed by nausea and an episode of emesis.     Clinical Impression   Pt received in bed, agreeable to OT evaluation, daughter entered at beginning of evaluation. Pt and daughter reporting left hand strength is much improved since yesterday. Pt able to make a fist today, unable to perform coordination tasks due to poor thumb strength and mobility; wrist strength is fair. Recommend outpatient OT services on discharge to improve strength, coordination, and functional use of LUE. Will continue to see in acute care.     Follow Up Recommendations  Outpatient OT    Equipment Recommendations  None recommended by OT       Precautions / Restrictions Precautions Precautions: None Restrictions Weight Bearing Restrictions: No      Mobility Bed Mobility Overal bed mobility: Independent                Transfers Overall transfer level: Independent                        ADL either performed or assessed with clinical judgement   ADL Overall ADL's : Needs assistance/impaired                                       General ADL Comments: Pt with difficulty using left hand as assist during ADLs due to weakness and  coordination deficits. Pt is right dominant and is able to perform most ADLs with RUE     Vision Baseline Vision/History: No visual deficits Patient Visual Report: No change from baseline Vision Assessment?: No apparent visual deficits            Pertinent Vitals/Pain Pain Assessment: No/denies pain     Hand Dominance Right   Extremity/Trunk Assessment Upper Extremity Assessment Upper Extremity Assessment: LUE deficits/detail LUE Deficits / Details: shoulder and elbow strength: 4-/5, wrist strength 3-/5, decreased grip and pinch strength. Pt able to make a fist, unable to perform thumb flexion/abduction LUE Sensation: decreased light touch LUE Coordination: decreased fine motor   Lower Extremity Assessment Lower Extremity Assessment: Defer to PT evaluation   Cervical / Trunk Assessment Cervical / Trunk Assessment: Normal   Communication Communication Communication: No difficulties   Cognition Arousal/Alertness: Awake/alert Behavior During Therapy: WFL for tasks assessed/performed Overall Cognitive Status: Within Functional Limits for tasks assessed                                                Home Living Family/patient expects to be discharged to:: Private residence Living Arrangements: Non-relatives/Friends Available Help at Discharge: Family;Friend(s);Available 24 hours/day Type  of Home: Mobile home Home Access: Stairs to enter [employer] of Steps: 3 Entrance Stairs-Rails: Left Home Layout: One level     Bathroom Shower/Tub: Teacher, early years/pre: Standard     Home Equipment: Environmental consultant - 2 wheels;Kasandra Knudsen - single point      Lives With: Friend(s)    Prior Functioning/Environment Level of Independence: Independent                 OT Problem List: Decreased strength;Decreased coordination;Impaired sensation      OT Treatment/Interventions: Self-care/ADL training;Therapeutic exercise;Neuromuscular  education;DME and/or AE instruction;Therapeutic activities;Patient/family education    OT Goals(Current goals can be found in the care plan section) Acute Rehab OT Goals Patient Stated Goal: to improve use of left hand and go home OT Goal Formulation: With patient Time For Goal Achievement: 07/28/17 Potential to Achieve Goals: Good  OT Frequency: Min 2X/week    End of Session    Activity Tolerance: Patient tolerated treatment well Patient left: in bed;with call bell/phone within reach;with family/visitor present  OT Visit Diagnosis: Muscle weakness (generalized) (M62.81)                Time: 9977-4142 OT Time Calculation (min): 17 min Charges:  OT General Charges $OT Visit: 1 Visit OT Evaluation $OT Eval Low Complexity: [place], OTR/L  [phone] 07/14/2017, 7:57 AM

## 2017-07-14 NOTE — Discharge Summary (Signed)
Physician Discharge Summary  Ryan Butler ZDG:[id number] DOB: 1950-10-27 DOA: 07/12/2017  PCP: Celene Squibb, MD  Admit date: 07/12/2017  Discharge date: 07/14/2017  Admitted From:Home  Disposition:  Home with outpatient PT  Recommendations for Outpatient Follow-up:  1. Follow up with PCP in 1-2 weeks 2. Follow up with Neurology as recommended  Home Health:N/A  Equipment/Devices:Home Holter monitor to be delivered  Discharge Condition:Stable  CODE STATUS: Full  Diet recommendation: Heart Healthy  Brief/Interim Summary:  This is a 67 year old male with history significant for CAD with prior endarterectomy, prior closed head injury, hypertension, seizures, TIAs and CVA who was brought to the ED after he woke up at 3 AM with left-sided weakness, slurred speech and facial droop.  This was followed by an episode of nausea and emesis.  He has been noted to have a right MCA distribution as well as right basal ganglia CVA on MRI/MRA.  This is suspicious for a cardioembolic phenomenon.  He was recommended aspirin and Plavix therapy, but cannot tolerate aspirin and per conversation with Dr. Merlene Laughter, he is okay to be discharged simply on Plavix at this time since he cannot tolerate aspirin.  He will have home cardiac monitoring for the next 30 days and Holter monitor were will be delivered to his home for this purpose.  He has been set up with outpatient physical therapy.   Discharge Diagnoses:  Principal Problem:   Acute CVA (cerebrovascular accident) ([place]) Active Problems:   Mixed hyperlipidemia   Essential hypertension, benign   Headache, unspecified headache type   CAROTID ENDARTERECTOMY, LEFT, HX OF   PVD (peripheral vascular disease) ([facility])   Insomnia   Chronic pain disorder   Type 2 diabetes mellitus ([place])  1. Acute right MCA distribution and right basal ganglia CVA-suspicious for cardioembolic CVA.    Carotid Dopplers with less than 50% bilateral stenosis noted 2D echocardiogram  with findings of EF 60-65% and grade 1 diastolic dysfunction.  No thrombus noted.  Continue on Plavix as patient will not tolerate aspirin.  Home Holter monitoring ordered. 2. Hypertension-well controlled.  Continue home treatment. 3. Mixed hyperlipidemia.  Continue atorvastatin. 4. History of carotid artery disease with peripheral vascular disease.  Continue Plavix and statin. 5. Insomnia.  Continue amitriptyline as needed. 6. Chronic pain disorder secondary to cervical spondylosis.  Continue pain medications as ordered. 7. Type 2 diabetes.  Continue on home oral hypoglycemic agents. HgA1c 7.7% and appears stable.   Discharge Instructions  Discharge Instructions    Diet - low sodium heart healthy   Complete by:  As directed    Increase activity slowly   Complete by:  As directed      Allergies as of 07/14/2017      Reactions   Aspirin Nausea And Vomiting   Hydrocodone Nausea Only      Medication List    TAKE these medications   ACCU-CHEK AVIVA PLUS test strip Generic drug:  glucose blood USE ONE STRIP TO CHECK GLUCOSE TWICE DAILY   ACCU-CHEK SOFTCLIX LANCETS lancets USE ONE LANCET TWICE DAILY AS DIRECTED TO CHECK BLOOD GLUCOSE   amitriptyline 150 MG tablet Commonly known as:  ELAVIL TAKE ONE TABLET BY MOUTH AT BEDTIME AS NEEDED FOR SLEEP   amLODipine 5 MG tablet Commonly known as:  NORVASC Take 1 tablet (5 mg total) by mouth daily.   atorvastatin 40 MG tablet Commonly known as:  LIPITOR Take 1 tablet (40 mg total) by mouth daily at 6 PM.   clopidogrel 75 MG  tablet Commonly known as:  PLAVIX Take 1 tablet (75 mg total) by mouth daily with breakfast.   glipiZIDE 5 MG tablet Commonly known as:  GLUCOTROL Take 1 tablet (5 mg total) by mouth daily before breakfast.   losartan 25 MG tablet Commonly known as:  COZAAR Take 1 tablet (25 mg total) by mouth daily.   metFORMIN 500 MG 24 hr tablet Commonly known as:  GLUCOPHAGE-XR Take 1 tablet (500 mg total) by mouth  daily with breakfast.   OVER THE COUNTER MEDICATION Take by mouth daily as needed (Kratom powder mixed in liquid and drank as needed for back pain).       Allergies  Allergen Reactions  . Aspirin Nausea And Vomiting  . Hydrocodone Nausea Only    Consultations:  Neurology Dr. Merlene Laughter   Procedures/Studies: Dg Chest 2 View  Result Date: 07/13/2017 CLINICAL DATA:  67 year old male with acute CVA.  Fall yesterday. EXAM: CHEST  2 VIEW COMPARISON:  01/07/2017 and prior chest radiographs FINDINGS: The cardiomediastinal silhouette is unremarkable. There is no evidence of focal airspace disease, pulmonary edema, suspicious pulmonary nodule/mass, pleural effusion, or pneumothorax. No acute bony abnormalities are identified. Cervical spine fusion hardware again noted. IMPRESSION: No active cardiopulmonary disease. Electronically Signed   By: Margarette Canada M.D.   On: 07/13/2017 08:58   Ct Head Wo Contrast  Result Date: 07/12/2017 CLINICAL DATA:  Fall left-sided arm weakness, left facial droop EXAM: CT HEAD WITHOUT CONTRAST TECHNIQUE: Contiguous axial images were obtained from the base of the skull through the vertex without intravenous contrast. COMPARISON:  MRI 01/07/2017, CT brain 10/25/2013 FINDINGS: Brain: No hemorrhage or intracranial mass is visualized. Multifocal hypodensity visualized within the right frontal lobe and right parietooccipital lobes. Additional mild hypodensity in the right basal ganglia compared to prior. Mild atrophy. Normal ventricle size. No midline shift. Vascular: No hyperdense vessels. Vertebral artery and carotid vessel calcification Skull: No fracture or suspicious lesion Sinuses/Orbits: No acute finding. Other: None IMPRESSION: 1. Multifocal hypodensities involving the right frontal and parietooccipital lobes with additional age indeterminate hypodensity in the right basal ganglia. Findings are concerning for multifocal areas of edema/possible acute to subacute infarcts;  further evaluation with MRI is recommended. No hemorrhage or focal mass lesion is seen. Electronically Signed   By: Donavan Foil M.D.   On: 07/12/2017 19:04   Mr Brain Wo Contrast  Result Date: 07/13/2017 CLINICAL DATA:  Stroke follow-up.  Left-sided weakness for 2-3 days. EXAM: MRI HEAD WITHOUT CONTRAST MRA HEAD WITHOUT CONTRAST TECHNIQUE: Multiplanar, multiecho pulse sequences of the brain and surrounding structures were obtained without intravenous contrast. Angiographic images of the head were obtained using MRA technique without contrast. COMPARISON:  Head CT from yesterday.  Brain MRI 01/07/2017 FINDINGS: MRI HEAD FINDINGS Brain: Multifocal moderate to large area of cortically based infarct in the right posterior frontal and occipital parietal region. There is also patchy acute infarction in the right putamen, internal capsule, and caudate nucleus. No hemorrhage, hydrocephalus, or masslike finding. Minor chronic small vessel ischemic type change in the periventricular white matter. Punctate acute infarct in the right cerebellum. Asymmetric increased DWI signal of the right hippocampus. Vascular: Arterial findings below. Normal dural venous sinus flow voids. Skull and upper cervical spine: No evidence of marrow lesion. Asymmetric right cervical facet arthropathy. C4-5 ACDF. Sinuses/Orbits: Negative MRA HEAD FINDINGS Advanced intracranial atherosclerosis. No acute finding when compared to prior. Extensive atherosclerotic irregularity of the bilateral carotid siphons with severe stenosis of the right mid cavernous segment where there is  flow gap. There is moderate left cavernous narrowing. Moderate narrowing of the left V4 segment. Advanced narrowing of left distal P2 and right proximal P3 segments. Moderate narrowing of left M2 branches. Moderate narrowing of proximal right pica. Mild narrowing of the mid basilar. IMPRESSION: 1. Multifocal acute infarction along the right MCA and border zone cortex.  Multifocal acute right basal ganglia infarct. Infarcts in this distribution were also seen 01/07/2017. 2. Small acute infarct in the right cerebellum. 3. Restricted diffusion in the right hippocampus, please correlate for seizure activity. 4. Stable intracranial MRA with advanced atherosclerosis. Most notable in this setting is a severe right ICA cavernous segment stenosis with flow gap. Additional narrowings described above. Electronically Signed   By: Monte Fantasia M.D.   On: 07/13/2017 08:36   US Carotid Bilateral (at [facility] And [facility] Only)  Result Date: 07/13/2017 CLINICAL DATA:  Acute CVA. Left facial weakness for 1 day. History of left carotid endarterectomy. EXAM: BILATERAL CAROTID DUPLEX ULTRASOUND TECHNIQUE: Pearline Cables scale imaging, color Doppler and duplex ultrasound were performed of bilateral carotid and vertebral arteries in the neck. COMPARISON:  01/07/2017 FINDINGS: Criteria: Quantification of carotid stenosis is based on velocity parameters that correlate the residual internal carotid diameter with NASCET-based stenosis levels, using the diameter of the distal internal carotid lumen as the denominator for stenosis measurement. The following velocity measurements were obtained: RIGHT ICA:  93 cm/sec CCA:  161 cm/sec SYSTOLIC ICA/CCA RATIO:  0.9 DIASTOLIC ICA/CCA RATIO:  1.0 ECA:  169 cm/sec LEFT ICA:  91 cm/sec CCA:  98 cm/sec SYSTOLIC ICA/CCA RATIO:  0.9 DIASTOLIC ICA/CCA RATIO:  1.7 ECA:  64 cm/sec RIGHT CAROTID ARTERY: Mild smooth mixed plaque along the bulb wall. Low resistance internal carotid Doppler pattern. RIGHT VERTEBRAL ARTERY:  Antegrade. LEFT CAROTID ARTERY: Mild smooth soft plaque along the wall of the bulb. Low resistance internal carotid Doppler pattern. LEFT VERTEBRAL ARTERY:  Antegrade. IMPRESSION: Less than 50% stenosis in the right and left internal carotid arteries. Electronically Signed   By: Marybelle Killings M.D.   On: 07/13/2017 12:38   Mr Jodene Nam Head/brain WR Cm  Result Date:  07/13/2017 CLINICAL DATA:  Stroke follow-up.  Left-sided weakness for 2-3 days. EXAM: MRI HEAD WITHOUT CONTRAST MRA HEAD WITHOUT CONTRAST TECHNIQUE: Multiplanar, multiecho pulse sequences of the brain and surrounding structures were obtained without intravenous contrast. Angiographic images of the head were obtained using MRA technique without contrast. COMPARISON:  Head CT from yesterday.  Brain MRI 01/07/2017 FINDINGS: MRI HEAD FINDINGS Brain: Multifocal moderate to large area of cortically based infarct in the right posterior frontal and occipital parietal region. There is also patchy acute infarction in the right putamen, internal capsule, and caudate nucleus. No hemorrhage, hydrocephalus, or masslike finding. Minor chronic small vessel ischemic type change in the periventricular white matter. Punctate acute infarct in the right cerebellum. Asymmetric increased DWI signal of the right hippocampus. Vascular: Arterial findings below. Normal dural venous sinus flow voids. Skull and upper cervical spine: No evidence of marrow lesion. Asymmetric right cervical facet arthropathy. C4-5 ACDF. Sinuses/Orbits: Negative MRA HEAD FINDINGS Advanced intracranial atherosclerosis. No acute finding when compared to prior. Extensive atherosclerotic irregularity of the bilateral carotid siphons with severe stenosis of the right mid cavernous segment where there is flow gap. There is moderate left cavernous narrowing. Moderate narrowing of the left V4 segment. Advanced narrowing of left distal P2 and right proximal P3 segments. Moderate narrowing of left M2 branches. Moderate narrowing of proximal right pica. Mild narrowing of the mid basilar.  IMPRESSION: 1. Multifocal acute infarction along the right MCA and border zone cortex. Multifocal acute right basal ganglia infarct. Infarcts in this distribution were also seen 01/07/2017. 2. Small acute infarct in the right cerebellum. 3. Restricted diffusion in the right hippocampus,  please correlate for seizure activity. 4. Stable intracranial MRA with advanced atherosclerosis. Most notable in this setting is a severe right ICA cavernous segment stenosis with flow gap. Additional narrowings described above. Electronically Signed   By: Monte Fantasia M.D.   On: 07/13/2017 08:36      Discharge Exam: Vitals:   07/14/17 0603 07/14/17 0830  BP: (!) 156/74 (!) 175/70  Pulse: 60 66  Resp: 14 15  Temp: 98.5 F (36.9 C) 98.6 F (37 C)  SpO2: 97% 96%   Vitals:   07/13/17 2118 07/13/17 2203 07/14/17 0603 07/14/17 0830  BP:  (!) 145/71 (!) 156/74 (!) 175/70  Pulse:  84 60 66  Resp:  $Remo'17 14 15  'fgIGS$ Temp:  98.5 F (36.9 C) 98.5 F (36.9 C) 98.6 F (37 C)  TempSrc:   Oral Oral  SpO2: 93% 96% 97% 96%  Weight:      Height:        General: Pt is alert, awake, not in acute distress Cardiovascular: RRR, S1/S2 +, no rubs, no gallops Respiratory: CTA bilaterally, no wheezing, no rhonchi Abdominal: Soft, NT, ND, bowel sounds + Extremities: no edema, no cyanosis    The results of significant diagnostics from this hospitalization (including imaging, microbiology, ancillary and laboratory) are listed below for reference.     Microbiology: No results found for this or any previous visit (from the past 240 hour(s)).   Labs: BNP (last 3 results) No results for input(s): BNP in the last 8760 hours. Basic Metabolic Panel: Recent Labs  Lab 07/12/17 1831 07/14/17 0516  NA 139 140  K 3.1* 3.2*  CL 104 107  CO2 24 24  GLUCOSE 193* 116*  BUN 28* 17  CREATININE 1.55* 0.74  CALCIUM 9.3 9.0  MG 1.8  --   PHOS 2.7  --    Liver Function Tests: No results for input(s): AST, ALT, ALKPHOS, BILITOT, PROT, ALBUMIN in the last 168 hours. No results for input(s): LIPASE, AMYLASE in the last 168 hours. No results for input(s): AMMONIA in the last 168 hours. CBC: Recent Labs  Lab 07/12/17 1831 07/14/17 0516  WBC 9.0 5.7  NEUTROABS 5.9  --   HGB 13.1 12.7*  HCT 38.6* 37.5*   MCV 90.4 90.8  PLT 150 138*   Cardiac Enzymes: No results for input(s): CKTOTAL, CKMB, CKMBINDEX, TROPONINI in the last 168 hours. BNP: Invalid input(s): POCBNP CBG: Recent Labs  Lab 07/13/17 1118 07/13/17 1631 07/13/17 2352 07/14/17 0739 07/14/17 1144  GLUCAP 155* 89 118* 130* 155*   D-Dimer No results for input(s): DDIMER in the last 72 hours. Hgb A1c Recent Labs    07/13/17 0728  HGBA1C 7.7*   Lipid Profile Recent Labs    07/13/17 0728  CHOL 126  HDL 39*  LDLCALC 67  TRIG 102  CHOLHDL 3.2   Thyroid function studies No results for input(s): TSH, T4TOTAL, T3FREE, THYROIDAB in the last 72 hours.  Invalid input(s): FREET3 Anemia work up No results for input(s): VITAMINB12, FOLATE, FERRITIN, TIBC, IRON, RETICCTPCT in the last 72 hours. Urinalysis    Component Value Date/Time   COLORURINE STRAW (A) 01/07/2017 [place] 01/07/2017 0944   LABSPEC 1.006 01/07/2017 0944   PHURINE 6.0 01/07/2017 0944  GLUCOSEU 50 (A) 01/07/2017 0944   HGBUR NEGATIVE 01/07/2017 [place] 01/07/2017 [place] 01/07/2017 0944   PROTEINUR NEGATIVE 01/07/2017 0944   NITRITE NEGATIVE 01/07/2017 0944   LEUKOCYTESUR NEGATIVE 01/07/2017 0944   Sepsis Labs Invalid input(s): PROCALCITONIN,  WBC,  LACTICIDVEN Microbiology No results found for this or any previous visit (from the past 240 hour(s)).   Time coordinating discharge: Over 30 minutes  SIGNED:   Rodena Goldmann, DO [facility] 07/14/2017, 12:06 PM Pager [phone]  If 7PM-7AM, please contact night-coverage [email or web address] Password [email or web address]

## 2017-07-14 NOTE — Care Management Note (Addendum)
Case Management Note  Patient Details  Name: JUNIE ENGRAM MRN: [id number] Date of Birth: 1950-05-30  Subjective/Objective:            Admitted with acute CVA. Pt from home, ind pta. Lives alone, has daughter at bedside who is very supportive. Pt has been recommended for OP OT/SLP. Pt agreeable, prefers to do therapy in [place].        Action/Plan: DC home today. Referral sent to [facility] for OP and SLP. Pt's daughter will ensure pt gets to appointments and co-pays are paid. Referral made for Emmi calls at DC.  Expected Discharge Date:  07/14/17               Expected Discharge Plan:  Home/Self Care  In-House Referral:  NA  Discharge planning Services  CM Consult  Status of Service:  Completed, signed off  Sherald Barge, RN 07/14/2017, 12:20 PM

## 2017-07-14 NOTE — Plan of Care (Signed)
  Acute Rehab OT Goals (only OT should resolve) Pt. Will Perform Grooming 07/14/2017 0800 by Chryl Heck, OT Flowsheets Taken 07/14/2017 0800  Pt Will Perform Grooming with modified independence;with set-up Note Using LUE as assist/non-dominant   Acute Rehab OT Goals (only OT should resolve) Pt/Caregiver Will Perform Home Exercise Program 07/14/2017 0800 by Chryl Heck, OT Flowsheets Taken 07/14/2017 0800  Pt/caregiver will Perform Home Exercise Program Increased strength;Left upper extremity;Independently;With Supervision;With written HEP provided

## 2017-07-20 ENCOUNTER — Encounter (HOSPITAL_COMMUNITY): Payer: Self-pay | Admitting: Speech Pathology

## 2017-07-20 ENCOUNTER — Other Ambulatory Visit: Payer: Self-pay

## 2017-07-20 ENCOUNTER — Ambulatory Visit (HOSPITAL_COMMUNITY): Payer: Medicare Other | Attending: Internal Medicine | Admitting: Speech Pathology

## 2017-07-20 DIAGNOSIS — R41841 Cognitive communication deficit: Secondary | ICD-10-CM | POA: Diagnosis present

## 2017-07-20 DIAGNOSIS — R29898 Other symptoms and signs involving the musculoskeletal system: Secondary | ICD-10-CM | POA: Diagnosis not present

## 2017-07-20 DIAGNOSIS — R278 Other lack of coordination: Secondary | ICD-10-CM | POA: Diagnosis not present

## 2017-07-20 DIAGNOSIS — R471 Dysarthria and anarthria: Secondary | ICD-10-CM | POA: Insufficient documentation

## 2017-07-20 NOTE — Therapy (Signed)
Ryan Butler, [place], [address] Phone: [phone]   Fax:  [phone]  Speech Language Pathology Evaluation  Patient Details  Name: Ryan Butler MRN: [id number] Date of Birth: 1950/07/23 Referring Provider: Heath Lark, DO   Encounter Date: 07/20/2017  End of Session - 07/20/17 1554    Visit Number  1    Number of Visits  1    Authorization Type  UHC Medicare    SLP Start Time  1430    SLP Stop Time   1520    SLP Time Calculation (min)  50 min    Activity Tolerance  Patient tolerated treatment well       Past Medical History:  Diagnosis Date  . Carotid artery disease ([place])   . Cervical spondylosis   . Closed head injury 1996  . Essential hypertension, benign   . History of colonic polyps   . History of scarlet fever   . History of seizures   . History of TIAs   . Polysubstance abuse ([place])    History of cocaine in his 30's  . Type 2 diabetes mellitus ([place])    Pt stopped metformin 09/2013    Past Surgical History:  Procedure Laterality Date  . ANTERIOR CERVICAL DECOMP/DISCECTOMY FUSION N/A 12/05/2013   Procedure: ANTERIOR CERVICAL DECOMPRESSION/DISCECTOMY FUSION 2 LEVELS;  Surgeon: Elaina Hoops, MD;  Location: [facility];  Service: Neurosurgery;  Laterality: N/A;  Anterior Cervical Discectomy and Fusion with PEEK Cages and allograft Cervical Four-Five/Five-Six  . FRACTURE SURGERY    . Hand laceration repair    . Left carotid endarterectomy     6/06 - Dr. Scot Dock  . skin graft to leg Left   . Tibia/fibula fracture repair    . VASCULAR SURGERY      There were no vitals filed for this visit.  Subjective Assessment - 07/20/17 1537    Subjective  "I want to be able to get my clothes on and ride my motorcycle."    Special Tests  MoCA    Currently in Pain?  No/denies         SLP Evaluation [facility] - 07/20/17 1537      SLP Visit Information   SLP Received On  07/20/17    Referring Provider   Heath Lark, DO    Onset Date  07/12/2017    Medical Diagnosis  s/p CVA      General Information   HPI  Ryan Butler is a 67 year old male with history significant for CADwith prior endarterectomy, prior closed head injury, hypertension, seizures, TIAs and CVA who was brought to the ED after he woke up at 3 AM with left-sided weakness, slurred speech and facial droop. This was followed by an episode of nausea and emesis. He has been noted to have a right MCA distribution as well as right basal ganglia CVA on MRI/MRA (07/12/17).This is suspicious for a cardioembolic phenomenon.He was recommended aspirin and Plavix therapy, but cannot tolerate aspirin and per conversation with Dr. Merlene Laughter, he is okay to be discharged simply on Plavix at this time since he cannot tolerate aspirin. He will have home cardiac monitoring for the next 30 days and Holter monitor were will be delivered to his home for this purpose. He was referred for outpatient speech therapy for cognitive linguistic evaluation.     Behavioral/Cognition  Alert and cooperative    Mobility Status  ambulatory      Balance Screen   Has  the patient fallen in the past 6 months  No    Has the patient had a decrease in activity level because of a fear of falling?   No    Is the patient reluctant to leave their home because of a fear of falling?   No      Prior Functional Status   Cognitive/Linguistic Baseline  Within functional limits    Type of Home  Mobile home     Lives With  Friend(s)    Available Support  Family    Education  9th grade    Vocation  On disability      Pain Assessment   Pain Assessment  No/denies pain      Cognition   Overall Cognitive Status  Impaired/Different from baseline    Area of Impairment  Memory;Orientation    Orientation Level  Time    General Comments  Date: "32" (25th)    Memory  Decreased short-term memory    Memory Comments  0/5 recall after 7 minutes    Memory  Impaired    Memory Impairment   Retrieval deficit;Storage deficit;Decreased short term memory;Decreased recall of new information    Decreased Short Term Memory  Verbal basic    Awareness  Appears intact    Problem Solving  Appears intact      Auditory Comprehension   Overall Auditory Comprehension  Appears within functional limits for tasks assessed    Yes/No Questions  Within Functional Limits    Commands  Within Functional Limits    Conversation  Moderately complex    Interfering Components  Working Corporate investment banker  Within Function Limits      Reading Comprehension   Reading Status  Not tested      Expression   Primary Mode of Expression  Verbal      Verbal Expression   Overall Verbal Expression  Impaired    Initiation  No impairment    Automatic Speech  Name;Social Response    Level of Generative/Spontaneous Verbalization  Conversation    Repetition  No impairment    Naming  No impairment    Pragmatics  No impairment    Interfering Components  Speech intelligibility    Non-Verbal Means of Communication  Not applicable      Written Expression   Dominant Hand  Right    Written Expression  Not tested      Oral Motor/Sensory Function   Overall Oral Motor/Sensory Function  Impaired    Labial ROM  Reduced left    Labial Symmetry  Abnormal symmetry left    Labial Strength  Reduced Left    Labial Sensation  Reduced Left    Labial Coordination  Reduced    Lingual ROM  Within Functional Limits    Lingual Symmetry  Within Functional Limits    Lingual Strength  Within Functional Limits    Lingual Sensation  Within Functional Limits    Lingual Coordination  WFL    Facial ROM  Reduced left    Facial Symmetry  Left droop    Facial Strength  Reduced    Facial Sensation  Reduced    Facial Coordination  Reduced    Velum  Within Functional Limits    Mandible  Within Functional Limits      Motor Speech   Overall Motor Speech   Impaired    Respiration  Within functional limits  Phonation  Normal    Resonance  Within functional limits    Articulation  Impaired    Level of Impairment  Sentence    Intelligibility  Intelligibility reduced    Word  75-100% accurate    Phrase  75-100% accurate    Sentence  75-100% accurate    Conversation  75-100% accurate    Motor Planning  Witnin functional limits    Motor Speech Errors  Aware;Unaware    Interfering Components  -- Pt wears U/L dentures, in place    Effective Techniques  Slow rate;Over-articulate    Phonation  WFL      Standardized Assessments   Standardized Assessments   Montreal Cognitive Assessment [facility])    Montreal Cognitive Assessment (MOCA)   19/30        SLP Education - 07/20/17 1552    Education provided  Yes    Education Details  Provided written information regarding dysarthria strategies, memory strategies, and limited oral motor tasks     Person(s) Educated  Patient    Methods  Explanation;Demonstration;Handout;Verbal cues    Comprehension  Verbalized understanding        Plan - 07/20/17 1554    Clinical Impression Statement  Pt presents with mild cognitive linguistic deficits characterized by mild dysarthria due to left facial sensorimotor impairment, mild/moderate working memory deficits, and mild visuospatial deficits resulting in reduced independence with communication skills. Mr. Juenger would benefit from additional skilled SLP services to target deficits above, however he is concerned about financial restrictions/copay. He states that he feels that his friends/family understand his speech and he implements compensatory strategies (calendar) for memory deficits. He hopes to make progress at home regarding his cognitive communication skills and inquired about ways to help himself at home. He also expressed a greater interest in receiving therapy for his left hand so that he can "get dressed and drive a motorcycle". SLP provided written  information regarding speech intelligibility and memory strategies and reviewed in session. Pt was encouraged to contact SLP if he should change his mind regarding therapy. Swallow screen also completed this date and Pt noted with mild pocketing in left buccal cavity with solids, which clears with liquid wash and reminders to clear oral cavity. He plans to follow up with his PCP, Dr. Nevada Crane tomorrow. Pt does not wish to pursue SLP therapy at this time.     Treatment/Interventions  Compensatory techniques;Oral motor exercises;Patient/family education;SLP instruction and feedback presented today    Consulted and Agree with Plan of Care  Patient       Patient will benefit from skilled therapeutic intervention in order to improve the following deficits and impairments:   Dysarthria and anarthria  Cognitive communication deficit    Problem List Patient Active Problem List   Diagnosis Date Noted  . Type 2 diabetes mellitus ([place]) 07/12/2017  . Acute CVA (cerebrovascular accident) ([place]) 07/12/2017  . Polyarthralgia 01/13/2017  . CVA (cerebral vascular accident) ([place]) 01/07/2017  . PVD (peripheral vascular disease) ([place]) 11/30/2015  . Insomnia 11/30/2015  . Chronic pain disorder 11/30/2015  . Spinal stenosis of lumbar region 04/25/2015  . Spinal stenosis in cervical region 12/05/2013  . Precordial pain 09/17/2011  . TOBACCO ABUSE 03/26/2006  . Headache, unspecified headache type 03/26/2006  . Type 2 diabetes mellitus with neurological manifestations, uncontrolled ([place]) 03/24/2006  . Mixed hyperlipidemia 03/24/2006  . Essential hypertension, benign 03/24/2006  . CAROTID ENDARTERECTOMY, LEFT, HX OF 03/04/2005   Thank you,  Genene Churn, [place]  PORTER,DABNEY 07/20/2017,  4:04 PM  [place], [place], [address] Phone: [phone]   Fax:  [phone]  Name: KOLE HILYARD MRN: [id number] Date of Birth:  1950-07-05

## 2017-07-21 ENCOUNTER — Telehealth: Payer: Self-pay | Admitting: Cardiovascular Disease

## 2017-07-21 ENCOUNTER — Other Ambulatory Visit: Payer: Self-pay

## 2017-07-21 ENCOUNTER — Ambulatory Visit (HOSPITAL_COMMUNITY): Payer: Medicare Other

## 2017-07-21 ENCOUNTER — Encounter (HOSPITAL_COMMUNITY): Payer: Self-pay

## 2017-07-21 DIAGNOSIS — I10 Essential (primary) hypertension: Secondary | ICD-10-CM | POA: Diagnosis not present

## 2017-07-21 DIAGNOSIS — G894 Chronic pain syndrome: Secondary | ICD-10-CM | POA: Diagnosis not present

## 2017-07-21 DIAGNOSIS — Z0000 Encounter for general adult medical examination without abnormal findings: Secondary | ICD-10-CM | POA: Diagnosis not present

## 2017-07-21 DIAGNOSIS — R29898 Other symptoms and signs involving the musculoskeletal system: Secondary | ICD-10-CM

## 2017-07-21 DIAGNOSIS — R471 Dysarthria and anarthria: Secondary | ICD-10-CM | POA: Diagnosis not present

## 2017-07-21 DIAGNOSIS — E119 Type 2 diabetes mellitus without complications: Secondary | ICD-10-CM | POA: Diagnosis not present

## 2017-07-21 DIAGNOSIS — R278 Other lack of coordination: Secondary | ICD-10-CM | POA: Diagnosis not present

## 2017-07-21 NOTE — Telephone Encounter (Signed)
Called Mitch from [employer] to locate monitor and notify office when it will arrive.

## 2017-07-21 NOTE — Therapy (Signed)
Ryan Butler, [place], [address] Phone: [phone]   Fax:  [phone]  Occupational Therapy Evaluation  Patient Details  Name: Ryan Butler MRN: [id number] Date of Birth: [birth year] Referring Provider: Heath Lark DO   Encounter Date: 07/21/2017  OT End of Session - 07/21/17 1502    Visit Number  1    Number of Visits  4    Date for OT Re-Evaluation  09/19/17    Authorization Type  [employer]HC medicare $40 copay    OT Start Time  1345    OT Stop Time  1430    OT Time Calculation (min)  45 min    Activity Tolerance  Patient tolerated treatment well    Behavior During Therapy  [facility] for tasks assessed/performed       Past Medical History:  Diagnosis Date  . Carotid artery disease ([place])   . Cervical spondylosis   . Closed head injury 1996  . Essential hypertension, benign   . History of colonic polyps   . History of scarlet fever   . History of seizures   . History of TIAs   . Polysubstance abuse ([place])    History of cocaine in his 30's  . Type 2 diabetes mellitus ([place])    Pt stopped metformin 09/2013    Past Surgical History:  Procedure Laterality Date  . ANTERIOR CERVICAL DECOMP/DISCECTOMY FUSION N/A 12/05/2013   Procedure: ANTERIOR CERVICAL DECOMPRESSION/DISCECTOMY FUSION 2 LEVELS;  Surgeon: Elaina Hoops, MD;  Location: [facility];  Service: Neurosurgery;  Laterality: N/A;  Anterior Cervical Discectomy and Fusion with PEEK Cages and allograft Cervical Four-Five/Five-Six  . FRACTURE SURGERY    . Hand laceration repair    . Left carotid endarterectomy     6/06 - Dr. Scot Dock  . skin graft to leg Left   . Tibia/fibula fracture repair    . VASCULAR SURGERY      There were no vitals filed for this visit.  Subjective Assessment - 07/21/17 1454    Subjective   S: I have a hard time holding onto and picking up items.     Pertinent History  male with history significant for CAD with prior endarterectomy, prior  closed head injury, hypertension, seizures, TIAs and CVA who was brought to the ED after he woke up at 3 AM with left-sided weakness, slurred speech and facial droop.  This was followed by an episode of nausea and emesis.  He has been noted to have a right MCA distribution as well as right basal ganglia CVA on MRI/MRA. Patient was hospitalized 07/12/17- 07/14/17. He has since been experienced LUE weakness; more so in the hand with reports of decreased sensation. Dr. Rodena Goldmann has referred patient to occupational therapy for evaluation and treatment.     Special Tests  FOTO score: 62/100    Patient Stated Goals  To be able to get dressed faster and go back to riding motorcycle.    Currently in Pain?  No/denies        [facility] OT Assessment - 07/21/17 1341      Assessment   Medical Diagnosis  right CVA    Referring Provider  Heath Lark DO    Onset Date/Surgical Date  07/12/17    Hand Dominance  Right    Next MD Visit  Pt has a follow up today with Dr. Delphina Cahill    Prior Therapy  Only received acute OT and PT for evaluation.  Precautions   Precautions  Other (comment)    Precaution Comments  Decreased sensation in LUE.      Restrictions   Weight Bearing Restrictions  No      Balance Screen   Has the patient fallen in the past 6 months  No    Has the patient had a decrease in activity level because of a fear of falling?   No    Is the patient reluctant to leave their home because of a fear of falling?   No      Home  Environment   Family/patient expects to be discharged to:  Private residence    Lives With  Friend(s)      Prior Function   Level of [place]  On disability    Leisure  riding motorcycles, fishing, hunting       ADL   ADL comments  Difficulty with buttoning pants, gripping and holding onto items.       Mobility   Mobility Status  Independent      Written Expression   Dominant Hand  Right      Vision - History   Baseline Vision   No visual deficits      Cognition   Overall Cognitive Status  Within Functional Limits for tasks assessed      Observation/Other Assessments   Focus on Therapeutic Outcomes (FOTO)   62/100      Sensation   Light Touch  Impaired by gross assessment    Stereognosis  Impaired by gross assessment    Hot/Cold  Impaired by gross assessment    Proprioception  Impaired by gross assessment      Coordination   9 Hole Peg Test  Right;Left    Right 9 Hole Peg Test  26.8"    Left 9 Hole Peg Test  Unable to complete even with set up.     Box and Blocks  R: 42 L: 12    Coordination  Patient unable to oppose thumb to any digits on the left hand.       ROM / Strength   AROM / PROM / Strength  Strength      Strength   Strength Assessment Site  Hand;Shoulder    Right/Left Shoulder  Left    Left Shoulder Flexion  5/5    Left Shoulder ABduction  5/5    Left Shoulder Internal Rotation  5/5    Left Shoulder External Rotation  5/5    Right/Left hand  Left;Right    Right Hand Grip (lbs)  45    Right Hand Lateral Pinch  12 lbs    Right Hand 3 Point Pinch  12 lbs    Left Hand Grip (lbs)  25    Left Hand Lateral Pinch  4 lbs    Left Hand 3 Point Pinch  4 lbs                      OT Education - 07/21/17 1501    Education provided  Yes    Education Details  yellow theraputty, coordination activities, education on sensation loss and techniques to help increase it.     Person(s) Educated  Patient    Methods  Explanation;Demonstration;Verbal cues;Handout    Comprehension  Returned demonstration;Verbalized understanding       OT Short Term Goals - 07/21/17 1632      OT SHORT TERM GOAL #1   Title  patient  will be educated and independent with HEP in order to faciliate his progress in therapy and allow him to return to his highest level of independence while using his LUE as his non-dominant extremity.     Time  8    Period  Weeks    Status  New    Target Date  09/19/17      OT  SHORT TERM GOAL #2   Title  Patient will increase his grip strength by 15# and pinch strength 5# in order to be able to hold onto items with dropping them as frequently.    Time  8    Period  Weeks    Status  New      OT SHORT TERM GOAL #3   Title  Patient will increase his coordination in left hand be completing the 9 hold peg test in under 45 seconds in the standardized fashion.      Time  8    Period  Weeks    Status  New               Plan - 07/21/17 1508    Clinical Impression Statement  A: Patient is a 67 y/o male S/P right CVA causing decreased strength, coordination, and sensation resulting in difficulty completing daily tasks with LUE.     Occupational Profile and client history currently impacting functional performance  motivated to return to prior level of function.     Occupational performance deficits (Please refer to evaluation for details):  ADL's;IADL's;Leisure    Rehab Potential  Excellent    Current Impairments/barriers affecting progress:  History of CVA (may 2018)    OT Frequency  1x / week every other week    OT Duration  8 weeks    OT Treatment/Interventions  Self-care/ADL training;Therapeutic exercise;Neuromuscular education;Electrical Stimulation;Patient/family education;Manual Therapy;Therapeutic activities;DME and/or AE instruction    Plan  P: Patient will benefit from skilled OT services to increase functional performance during daily tasks while using his LUE as his non-dominant extremity. Treatment  Plan: grip and pinch strengthening. coordination, sensation education, HEP as needed each visit.    Clinical Decision Making  Several treatment options, min-mod task modification necessary       Patient will benefit from skilled therapeutic intervention in order to improve the following deficits and impairments:  Impaired UE functional use, Decreased coordination, Impaired sensation, Decreased strength  Visit Diagnosis: Other lack of coordination -  Plan: Ot plan of care cert/re-cert  Other symptoms and signs involving the musculoskeletal system - Plan: Ot plan of care cert/re-cert    Problem List Patient Active Problem List   Diagnosis Date Noted  . Type 2 diabetes mellitus ([place]) 07/12/2017  . Acute CVA (cerebrovascular accident) ([place]) 07/12/2017  . Polyarthralgia 01/13/2017  . CVA (cerebral vascular accident) ([place]) 01/07/2017  . PVD (peripheral vascular disease) ([place]) 11/30/2015  . Insomnia 11/30/2015  . Chronic pain disorder 11/30/2015  . Spinal stenosis of lumbar region 04/25/2015  . Spinal stenosis in cervical region 12/05/2013  . Precordial pain 09/17/2011  . TOBACCO ABUSE 03/26/2006  . Headache, unspecified headache type 03/26/2006  . Type 2 diabetes mellitus with neurological manifestations, uncontrolled ([place]) 03/24/2006  . Mixed hyperlipidemia 03/24/2006  . Essential hypertension, benign 03/24/2006  . CAROTID ENDARTERECTOMY, LEFT, HX OF 03/04/2005   Ailene Ravel, OTR/L,CBIS  [phone]  07/21/2017, 4:49 PM  [place] [address] [place], [place], [address] Phone: [phone]   Fax:  [phone]  Name: Ryan  ROCHELLE Butler MRN: [id number] Date of Birth: [birth year]

## 2017-07-21 NOTE — Telephone Encounter (Signed)
Calling to check status of event monitor / tg

## 2017-07-21 NOTE — Patient Instructions (Signed)
Home Exercises Program Theraputty Exercises  Do the following exercises 3-5 times a day using your affected hand.  1. Roll putty into a ball.  2. Make into a pancake.  3. Roll putty into a roll.  4. Pinch along log with first finger and thumb.   5. Make into a ball.  6. Roll it back into a log.   7. Pinch using thumb and side of first finger.  8. Roll into a ball, then flatten into a pancake.  9. Using your fingers, make putty into a mountain.  10. Grip and release 10-20 times.   Coordination Activities  Perform the following activities for 30 minutes 3 times per day with left hand(s).   Toss ball between hands.  Flip cards 1 at a time as fast as you can.  Deal cards with your thumb (Hold deck in hand and push card off top with thumb).  Rotate card in hand (clockwise and counter-clockwise).  Shuffle cards.  Pick up coins, buttons, marbles, dried beans/pasta of different sizes and place in container.  Pick up coins and place in container or coin bank.    Facts You Should Know Sensation and Loss of Feeling  As a result of your condition, you may notice numbness or a loss of feeling in your left arm.  Loss of feeling can be a serious problem.  Areas on your body without feeling can be easily burned, cut or injured while you are doing everyday activities.  You need to know how to protect these areas.  You may not be able to feel certain objects, textures, movements and/or temperatures.  It has been identified that you have trouble feeling: Sharp objects, such as knives, scissors, or needles, Hot Substances, such as coffee, cigarettes, or bath water, Cold substances such as ice or snow, Soft materials, such as cotton or animal fur, Someone else moving your arm or leg and Objects in your hand  Below are recommendations for when you go home.  These things will help to protect your body parts that have a loss of feeling and will help you prevent injury to  yourself.  When using scissors, knives, needles, etc. without help, use caution so that you do not cut yourself., Stay away from heat sources like the ove,m kerosene heaters and fireplaces., It is better if you do not smoke.  If you do smoke, make sure someone is around to help you use your lighter and to monitor that your ashes are extinguished., Keep affected arm/leg away from stove or other hot objects, Wear shoes and socks when outdoors. and Check yourself daily for cuts, bumps and bruises and contact your doctor if any of these concern you.  The best way to protect the areas without feeling is to look to see where your body parts are and what is touching them.

## 2017-07-22 ENCOUNTER — Telehealth: Payer: Self-pay | Admitting: Cardiovascular Disease

## 2017-07-22 NOTE — Telephone Encounter (Signed)
Called [employer] to check on the statis of monitor. Informed that it will arrive by tomorrow at 3 pm. Pt notified that monitor will arrive tomorrow by the end of the day.

## 2017-07-22 NOTE — Telephone Encounter (Signed)
Pt has still not received his heart monitor

## 2017-07-28 DIAGNOSIS — I10 Essential (primary) hypertension: Secondary | ICD-10-CM | POA: Diagnosis not present

## 2017-07-28 DIAGNOSIS — G894 Chronic pain syndrome: Secondary | ICD-10-CM | POA: Diagnosis not present

## 2017-07-28 DIAGNOSIS — Z6822 Body mass index (BMI) 22.0-22.9, adult: Secondary | ICD-10-CM | POA: Diagnosis not present

## 2017-07-28 DIAGNOSIS — E119 Type 2 diabetes mellitus without complications: Secondary | ICD-10-CM | POA: Diagnosis not present

## 2017-08-03 ENCOUNTER — Encounter (HOSPITAL_COMMUNITY): Payer: Medicare Other | Admitting: Speech Pathology

## 2017-08-03 ENCOUNTER — Encounter (HOSPITAL_COMMUNITY): Payer: Self-pay | Admitting: Specialist

## 2017-08-03 ENCOUNTER — Ambulatory Visit (HOSPITAL_COMMUNITY): Payer: Medicare Other | Attending: Internal Medicine | Admitting: Specialist

## 2017-08-03 DIAGNOSIS — R278 Other lack of coordination: Secondary | ICD-10-CM | POA: Insufficient documentation

## 2017-08-03 DIAGNOSIS — R29898 Other symptoms and signs involving the musculoskeletal system: Secondary | ICD-10-CM | POA: Insufficient documentation

## 2017-08-03 NOTE — Therapy (Signed)
[place], [place], [address] Phone: [phone]   Fax:  [phone]  Occupational Therapy Treatment  Patient Details  Name: Ryan Butler MRN: [id number] Date of Birth: 02-23-1951 Referring Provider: Heath Lark DO   Encounter Date: 08/03/2017  OT End of Session - 08/03/17 1416    Visit Number  2    Number of Visits  4    Date for OT Re-Evaluation  09/19/17    Authorization Type  [employer] medicare $40 copay    OT Start Time  1350    OT Stop Time  1430    OT Time Calculation (min)  40 min    Activity Tolerance  Patient tolerated treatment well    Behavior During Therapy  [facility] for tasks assessed/performed       Past Medical History:  Diagnosis Date  . Carotid artery disease ([place])   . Cervical spondylosis   . Closed head injury 1996  . Essential hypertension, benign   . History of colonic polyps   . History of scarlet fever   . History of seizures   . History of TIAs   . Polysubstance abuse ([place])    History of cocaine in his 30's  . Type 2 diabetes mellitus ([place])    Pt stopped metformin 09/2013    Past Surgical History:  Procedure Laterality Date  . ANTERIOR CERVICAL DECOMP/DISCECTOMY FUSION N/A 12/05/2013   Procedure: ANTERIOR CERVICAL DECOMPRESSION/DISCECTOMY FUSION 2 LEVELS;  Surgeon: Elaina Hoops, MD;  Location: [facility];  Service: Neurosurgery;  Laterality: N/A;  Anterior Cervical Discectomy and Fusion with PEEK Cages and allograft Cervical Four-Five/Five-Six  . FRACTURE SURGERY    . Hand laceration repair    . Left carotid endarterectomy     6/06 - Dr. Scot Dock  . skin graft to leg Left   . Tibia/fibula fracture repair    . VASCULAR SURGERY      There were no vitals filed for this visit.  Subjective Assessment - 08/03/17 1415    Subjective   S:  I have been doing the exercises and making up some of my own.      Currently in Pain?  No/denies    Pain Score  0-No pain    Pain Radiating Towards   is complaining of numbness but not pain         [facility] OT Assessment - 08/03/17 0001      Assessment   Medical Diagnosis  right CVA      Precautions   Precaution Comments  Decreased sensation in LUE.               OT Treatments/Exercises (OP) - 08/03/17 0001      Exercises   Exercises  Hand;Theraputty      Hand Exercises   Hand Gripper with Large Beads  attempted with black handled hand gripper, and patient is unable to do so.      Sponges  attempted, due to lack of thumb movement this activity is too difficult.        Theraputty   Theraputty - Flatten  red in standing    Theraputty - Roll  red    Theraputty - Grip  supinated and pronated grip with therapist assisting with hand positioning    Theraputty Hand- Locate Pegs  10 beads requires moderate assistance locating beads secondary to decreased left hand sensation             OT Education -  08/03/17 1416    Education Details  reviewed plan of care and issued patient a written copy    Person(s) Educated  Patient    Methods  Explanation;Demonstration;Handout    Comprehension  Verbalized understanding;Returned demonstration       OT Short Term Goals - 08/03/17 1418      OT SHORT TERM GOAL #1   Title  patient will be educated and independent with HEP in order to faciliate his progress in therapy and allow him to return to his highest level of independence while using his LUE as his non-dominant extremity.     Time  8    Period  Weeks    Status  On-going      OT SHORT TERM GOAL #2   Title  Patient will increase his grip strength by 15# and pinch strength 5# in order to be able to hold onto items with dropping them as frequently.    Time  8    Period  Weeks    Status  On-going      OT SHORT TERM GOAL #3   Title  Patient will increase his coordination in left hand be completing the 9 hold peg test in under 45 seconds in the standardized fashion.      Time  8    Period  Weeks    Status  On-going                Plan - 08/03/17 1417    Clinical Impression Statement  A:  Patient has difficulty placing hand onto putty cylinder for grip strengthening exercises, once assisted with positioning he is able to squeeze independently.      Plan  P:  Add coordination and sensation exercises to treatment session, follow up on improvements with left hand functional use at home.        Patient will benefit from skilled therapeutic intervention in order to improve the following deficits and impairments:     Visit Diagnosis: Other lack of coordination  Other symptoms and signs involving the musculoskeletal system    Problem List Patient Active Problem List   Diagnosis Date Noted  . Type 2 diabetes mellitus ([place]) 07/12/2017  . Acute CVA (cerebrovascular accident) ([place]) 07/12/2017  . Polyarthralgia 01/13/2017  . CVA (cerebral vascular accident) ([place]) 01/07/2017  . PVD (peripheral vascular disease) ([place]) 11/30/2015  . Insomnia 11/30/2015  . Chronic pain disorder 11/30/2015  . Spinal stenosis of lumbar region 04/25/2015  . Spinal stenosis in cervical region 12/05/2013  . Precordial pain 09/17/2011  . TOBACCO ABUSE 03/26/2006  . Headache, unspecified headache type 03/26/2006  . Type 2 diabetes mellitus with neurological manifestations, uncontrolled ([place]) 03/24/2006  . Mixed hyperlipidemia 03/24/2006  . Essential hypertension, benign 03/24/2006  . CAROTID ENDARTERECTOMY, LEFT, HX OF 03/04/2005    Vangie Bicker, [place], OTR/L [phone]  08/03/2017, 4:19 PM  [place], [place], [address] Phone: [phone]   Fax:  [phone]  Name: Ryan Butler MRN: [id number] Date of Birth: 1950/10/29

## 2017-08-14 DIAGNOSIS — G8929 Other chronic pain: Secondary | ICD-10-CM | POA: Diagnosis not present

## 2017-08-14 DIAGNOSIS — M545 Low back pain: Secondary | ICD-10-CM | POA: Diagnosis not present

## 2017-08-14 DIAGNOSIS — Z79899 Other long term (current) drug therapy: Secondary | ICD-10-CM | POA: Diagnosis not present

## 2017-08-14 DIAGNOSIS — I10 Essential (primary) hypertension: Secondary | ICD-10-CM | POA: Diagnosis not present

## 2017-08-14 DIAGNOSIS — M542 Cervicalgia: Secondary | ICD-10-CM | POA: Diagnosis not present

## 2017-08-18 ENCOUNTER — Other Ambulatory Visit: Payer: Self-pay

## 2017-08-18 ENCOUNTER — Encounter (HOSPITAL_COMMUNITY): Payer: Self-pay

## 2017-08-18 ENCOUNTER — Ambulatory Visit (HOSPITAL_COMMUNITY): Payer: Medicare Other

## 2017-08-18 DIAGNOSIS — R29898 Other symptoms and signs involving the musculoskeletal system: Secondary | ICD-10-CM

## 2017-08-18 DIAGNOSIS — R278 Other lack of coordination: Secondary | ICD-10-CM

## 2017-08-18 DIAGNOSIS — R55 Syncope and collapse: Secondary | ICD-10-CM | POA: Diagnosis not present

## 2017-08-18 NOTE — Therapy (Signed)
[place], [place], [address] Phone: [phone]   Fax:  [phone]  Occupational Therapy Treatment  Patient Details  Name: Ryan Butler MRN: [id number] Date of Birth: 03-Apr-1951 Referring Provider: Heath Lark DO   Encounter Date: 08/18/2017  OT End of Session - 08/18/17 1508    Visit Number  3    Number of Visits  4    Date for OT Re-Evaluation  09/19/17    Authorization Type  [employer] medicare $40 copay    OT Start Time  1425    OT Stop Time  1510    OT Time Calculation (min)  45 min    Activity Tolerance  Patient tolerated treatment well    Behavior During Therapy  [facility] for tasks assessed/performed       Past Medical History:  Diagnosis Date  . Carotid artery disease ([place])   . Cervical spondylosis   . Closed head injury 1996  . Essential hypertension, benign   . History of colonic polyps   . History of scarlet fever   . History of seizures   . History of TIAs   . Polysubstance abuse ([place])    History of cocaine in his 30's  . Type 2 diabetes mellitus ([place])    Pt stopped metformin 09/2013    Past Surgical History:  Procedure Laterality Date  . ANTERIOR CERVICAL DECOMP/DISCECTOMY FUSION N/A 12/05/2013   Procedure: ANTERIOR CERVICAL DECOMPRESSION/DISCECTOMY FUSION 2 LEVELS;  Surgeon: Elaina Hoops, MD;  Location: [facility];  Service: Neurosurgery;  Laterality: N/A;  Anterior Cervical Discectomy and Fusion with PEEK Cages and allograft Cervical Four-Five/Five-Six  . FRACTURE SURGERY    . Hand laceration repair    . Left carotid endarterectomy     6/06 - Dr. Scot Dock  . skin graft to leg Left   . Tibia/fibula fracture repair    . VASCULAR SURGERY      There were no vitals filed for this visit.  Subjective Assessment - 08/18/17 1439    Subjective   S: I've been working on it. Opening doors is still hard.     Currently in Pain?  No/denies No pain in hands. Chronic pain in neck, shoulders, and back          [facility] OT Assessment - 08/18/17 1441      Assessment   Medical Diagnosis  right CVA      Precautions   Precautions  Other (comment)    Precaution Comments  Decreased sensation in LUE.               OT Treatments/Exercises (OP) - 08/18/17 1441      Exercises   Exercises  Hand;Theraputty      Hand Exercises   Hand Gripper with Large Beads  all beads with gripper set at 25#. Horizontal    Sponges  3,3    Other Hand Exercises  Pt utilized resistive clothespins while utilizing a 2 point pinch with pointer and thumb to place yellow, red, and green pins on vertical pole. Pt then removed all pins.                 OT Short Term Goals - 08/03/17 1418      OT SHORT TERM GOAL #1   Title  patient will be educated and independent with HEP in order to faciliate his progress in therapy and allow him to return to his highest level of independence while using his LUE  as his non-dominant extremity.     Time  8    Period  Weeks    Status  On-going      OT SHORT TERM GOAL #2   Title  Patient will increase his grip strength by 15# and pinch strength 5# in order to be able to hold onto items with dropping them as frequently.    Time  8    Period  Weeks    Status  On-going      OT SHORT TERM GOAL #3   Title  Patient will increase his coordination in left hand be completing the 9 hold peg test in under 45 seconds in the standardized fashion.      Time  8    Period  Weeks    Status  On-going               Plan - 08/18/17 1544    Clinical Impression Statement  A: Pt has increased difficulty with using a functional finger tip pinch such as a 2 point pinch when completing functional tasks. Pt refers to using a lateral pinch. Pt's decreased sensation poses an increased difficulty with him being able to perceive when he's picking up and how hard he is pinching which leads to a lack of coordination and increased clumsiness. Pt was unable to complete grooved pegboard  task due to decreased sensation. Pt was educated on what tasks to focus on at home until next session.     Plan  P: Continue to focus on coordination, grip, and pinch strength.     Consulted and Agree with Plan of Care  Patient       Patient will benefit from skilled therapeutic intervention in order to improve the following deficits and impairments:  Impaired UE functional use, Decreased coordination, Impaired sensation, Decreased strength  Visit Diagnosis: Other lack of coordination  Other symptoms and signs involving the musculoskeletal system    Problem List Patient Active Problem List   Diagnosis Date Noted  . Type 2 diabetes mellitus ([place]) 07/12/2017  . Acute CVA (cerebrovascular accident) ([place]) 07/12/2017  . Polyarthralgia 01/13/2017  . CVA (cerebral vascular accident) ([place]) 01/07/2017  . PVD (peripheral vascular disease) ([place]) 11/30/2015  . Insomnia 11/30/2015  . Chronic pain disorder 11/30/2015  . Spinal stenosis of lumbar region 04/25/2015  . Spinal stenosis in cervical region 12/05/2013  . Precordial pain 09/17/2011  . TOBACCO ABUSE 03/26/2006  . Headache, unspecified headache type 03/26/2006  . Type 2 diabetes mellitus with neurological manifestations, uncontrolled ([place]) 03/24/2006  . Mixed hyperlipidemia 03/24/2006  . Essential hypertension, benign 03/24/2006  . CAROTID ENDARTERECTOMY, LEFT, HX OF 03/04/2005   Ailene Ravel, OTR/L,CBIS  [phone]  08/18/2017, 3:48 PM  [place] [address] [place], [place], [address] Phone: [phone]   Fax:  [phone]  Name: TAJAY MUZZY MRN: [id number] Date of Birth: 08/13/1950

## 2017-08-28 DIAGNOSIS — Z79899 Other long term (current) drug therapy: Secondary | ICD-10-CM | POA: Diagnosis not present

## 2017-08-28 DIAGNOSIS — M5136 Other intervertebral disc degeneration, lumbar region: Secondary | ICD-10-CM | POA: Diagnosis not present

## 2017-08-28 DIAGNOSIS — M5090 Cervical disc disorder, unspecified, unspecified cervical region: Secondary | ICD-10-CM | POA: Diagnosis not present

## 2017-08-31 ENCOUNTER — Telehealth: Payer: Self-pay

## 2017-08-31 NOTE — Telephone Encounter (Signed)
I received a fax communication from [facility] services on 4/7/19that patient has requested early termination of monitoring services

## 2017-09-01 ENCOUNTER — Ambulatory Visit (HOSPITAL_COMMUNITY): Payer: Medicare Other | Attending: Internal Medicine

## 2017-09-05 ENCOUNTER — Other Ambulatory Visit: Payer: Self-pay | Admitting: Family Medicine

## 2017-09-05 DIAGNOSIS — IMO0002 Reserved for concepts with insufficient information to code with codable children: Secondary | ICD-10-CM

## 2017-09-05 DIAGNOSIS — E1165 Type 2 diabetes mellitus with hyperglycemia: Principal | ICD-10-CM

## 2017-09-05 DIAGNOSIS — E1140 Type 2 diabetes mellitus with diabetic neuropathy, unspecified: Secondary | ICD-10-CM

## 2017-09-10 ENCOUNTER — Other Ambulatory Visit: Payer: Self-pay | Admitting: Family Medicine

## 2017-09-10 DIAGNOSIS — E1165 Type 2 diabetes mellitus with hyperglycemia: Principal | ICD-10-CM

## 2017-09-10 DIAGNOSIS — IMO0002 Reserved for concepts with insufficient information to code with codable children: Secondary | ICD-10-CM

## 2017-09-10 DIAGNOSIS — E1140 Type 2 diabetes mellitus with diabetic neuropathy, unspecified: Secondary | ICD-10-CM

## 2017-09-15 ENCOUNTER — Ambulatory Visit (HOSPITAL_COMMUNITY): Payer: Medicare Other

## 2017-09-23 DIAGNOSIS — M25512 Pain in left shoulder: Secondary | ICD-10-CM | POA: Diagnosis not present

## 2017-09-23 DIAGNOSIS — M5090 Cervical disc disorder, unspecified, unspecified cervical region: Secondary | ICD-10-CM | POA: Diagnosis not present

## 2017-09-23 DIAGNOSIS — Z79899 Other long term (current) drug therapy: Secondary | ICD-10-CM | POA: Diagnosis not present

## 2017-09-23 DIAGNOSIS — M5136 Other intervertebral disc degeneration, lumbar region: Secondary | ICD-10-CM | POA: Diagnosis not present

## 2017-09-24 ENCOUNTER — Other Ambulatory Visit: Payer: Self-pay | Admitting: Family Medicine

## 2017-09-30 ENCOUNTER — Other Ambulatory Visit: Payer: Self-pay | Admitting: Family Medicine

## 2017-09-30 NOTE — Telephone Encounter (Signed)
Left message for patient on number: [phone] to give office a call to schedule appointment for medication refills.

## 2017-10-01 DIAGNOSIS — G4700 Insomnia, unspecified: Secondary | ICD-10-CM | POA: Diagnosis not present

## 2017-10-01 DIAGNOSIS — E119 Type 2 diabetes mellitus without complications: Secondary | ICD-10-CM | POA: Diagnosis not present

## 2017-10-01 DIAGNOSIS — E782 Mixed hyperlipidemia: Secondary | ICD-10-CM | POA: Diagnosis not present

## 2017-10-01 DIAGNOSIS — I6529 Occlusion and stenosis of unspecified carotid artery: Secondary | ICD-10-CM | POA: Diagnosis not present

## 2017-10-01 DIAGNOSIS — I10 Essential (primary) hypertension: Secondary | ICD-10-CM | POA: Diagnosis not present

## 2017-10-23 DIAGNOSIS — M25512 Pain in left shoulder: Secondary | ICD-10-CM | POA: Diagnosis not present

## 2017-10-23 DIAGNOSIS — Z79899 Other long term (current) drug therapy: Secondary | ICD-10-CM | POA: Diagnosis not present

## 2017-10-23 DIAGNOSIS — M5136 Other intervertebral disc degeneration, lumbar region: Secondary | ICD-10-CM | POA: Diagnosis not present

## 2017-10-23 DIAGNOSIS — M1990 Unspecified osteoarthritis, unspecified site: Secondary | ICD-10-CM | POA: Diagnosis not present

## 2017-11-16 ENCOUNTER — Encounter (HOSPITAL_COMMUNITY): Payer: Self-pay

## 2017-11-16 NOTE — Therapy (Signed)
[place], [place], [address] Phone: [phone]   Fax:  [phone]  Patient Details  Name: Ryan Butler MRN: [id number] Date of Birth: [birth year] Referring Provider:  No ref. provider found  Encounter Date: 11/16/2017   OCCUPATIONAL THERAPY DISCHARGE SUMMARY  Visits from Start of Care: 3  Current functional level related to goals / functional outcomes:  OT SHORT TERM GOAL #1   Title  patient will be educated and independent with HEP in order to faciliate his progress in therapy and allow him to return to his highest level of independence while using his LUE as his non-dominant extremity.     Time  8    Period  Weeks    Status  On-going        OT SHORT TERM GOAL #2   Title  Patient will increase his grip strength by 15# and pinch strength 5# in order to be able to hold onto items with dropping them as frequently.    Time  8    Period  Weeks    Status  On-going        OT SHORT TERM GOAL #3   Title  Patient will increase his coordination in left hand be completing the 9 hold peg test in under 45 seconds in the standardized fashion.      Time  8    Period  Weeks    Status  On-going        Remaining deficits: Patient no showed for last two treatment session. Unknown if any deficits remain. Based on performance during last session patient continued to have deficits with grip, pinch, and coordination of his left hand.   Education / Equipment: HEP for grip, pinch and coordination Plan: Patient agrees to discharge.  Patient goals were not met. Patient is being discharged due to not returning since the last visit.  ?????          Ailene Ravel, OTR/L,CBIS  [phone]  11/16/2017, 11:52 AM  [place], [place], [address] Phone: [phone]   Fax:  [phone]

## 2017-11-23 DIAGNOSIS — Z79899 Other long term (current) drug therapy: Secondary | ICD-10-CM | POA: Diagnosis not present

## 2017-11-23 DIAGNOSIS — M5136 Other intervertebral disc degeneration, lumbar region: Secondary | ICD-10-CM | POA: Diagnosis not present

## 2017-11-23 DIAGNOSIS — M1990 Unspecified osteoarthritis, unspecified site: Secondary | ICD-10-CM | POA: Diagnosis not present

## 2017-12-23 DIAGNOSIS — M5136 Other intervertebral disc degeneration, lumbar region: Secondary | ICD-10-CM | POA: Diagnosis not present

## 2017-12-23 DIAGNOSIS — G894 Chronic pain syndrome: Secondary | ICD-10-CM | POA: Diagnosis not present

## 2017-12-23 DIAGNOSIS — M1990 Unspecified osteoarthritis, unspecified site: Secondary | ICD-10-CM | POA: Diagnosis not present

## 2017-12-23 DIAGNOSIS — Z79899 Other long term (current) drug therapy: Secondary | ICD-10-CM | POA: Diagnosis not present

## 2018-01-04 DIAGNOSIS — E119 Type 2 diabetes mellitus without complications: Secondary | ICD-10-CM | POA: Diagnosis not present

## 2018-01-04 DIAGNOSIS — I6529 Occlusion and stenosis of unspecified carotid artery: Secondary | ICD-10-CM | POA: Diagnosis not present

## 2018-01-04 DIAGNOSIS — G4700 Insomnia, unspecified: Secondary | ICD-10-CM | POA: Diagnosis not present

## 2018-01-04 DIAGNOSIS — I10 Essential (primary) hypertension: Secondary | ICD-10-CM | POA: Diagnosis not present

## 2018-01-04 DIAGNOSIS — E782 Mixed hyperlipidemia: Secondary | ICD-10-CM | POA: Diagnosis not present

## 2018-01-06 DIAGNOSIS — I10 Essential (primary) hypertension: Secondary | ICD-10-CM | POA: Diagnosis not present

## 2018-01-06 DIAGNOSIS — E782 Mixed hyperlipidemia: Secondary | ICD-10-CM | POA: Diagnosis not present

## 2018-01-06 DIAGNOSIS — Z0000 Encounter for general adult medical examination without abnormal findings: Secondary | ICD-10-CM | POA: Diagnosis not present

## 2018-01-06 DIAGNOSIS — G4700 Insomnia, unspecified: Secondary | ICD-10-CM | POA: Diagnosis not present

## 2018-01-14 ENCOUNTER — Other Ambulatory Visit: Payer: Self-pay | Admitting: Family Medicine

## 2018-01-22 DIAGNOSIS — M1990 Unspecified osteoarthritis, unspecified site: Secondary | ICD-10-CM | POA: Diagnosis not present

## 2018-01-22 DIAGNOSIS — Z79899 Other long term (current) drug therapy: Secondary | ICD-10-CM | POA: Diagnosis not present

## 2018-01-22 DIAGNOSIS — M5136 Other intervertebral disc degeneration, lumbar region: Secondary | ICD-10-CM | POA: Diagnosis not present

## 2018-01-22 DIAGNOSIS — G894 Chronic pain syndrome: Secondary | ICD-10-CM | POA: Diagnosis not present

## 2018-02-16 DIAGNOSIS — H35031 Hypertensive retinopathy, right eye: Secondary | ICD-10-CM | POA: Diagnosis not present

## 2018-02-16 DIAGNOSIS — I10 Essential (primary) hypertension: Secondary | ICD-10-CM | POA: Diagnosis not present

## 2018-02-16 DIAGNOSIS — E119 Type 2 diabetes mellitus without complications: Secondary | ICD-10-CM | POA: Diagnosis not present

## 2018-02-19 DIAGNOSIS — Z0000 Encounter for general adult medical examination without abnormal findings: Secondary | ICD-10-CM | POA: Diagnosis not present

## 2018-02-19 DIAGNOSIS — Z6821 Body mass index (BMI) 21.0-21.9, adult: Secondary | ICD-10-CM | POA: Diagnosis not present

## 2018-02-22 DIAGNOSIS — G894 Chronic pain syndrome: Secondary | ICD-10-CM | POA: Diagnosis not present

## 2018-02-22 DIAGNOSIS — Z79899 Other long term (current) drug therapy: Secondary | ICD-10-CM | POA: Diagnosis not present

## 2018-02-22 DIAGNOSIS — M1990 Unspecified osteoarthritis, unspecified site: Secondary | ICD-10-CM | POA: Diagnosis not present

## 2018-02-24 ENCOUNTER — Ambulatory Visit: Payer: Self-pay | Admitting: Orthopedic Surgery

## 2018-03-12 DIAGNOSIS — E782 Mixed hyperlipidemia: Secondary | ICD-10-CM | POA: Diagnosis not present

## 2018-03-12 DIAGNOSIS — E119 Type 2 diabetes mellitus without complications: Secondary | ICD-10-CM | POA: Diagnosis not present

## 2018-03-12 DIAGNOSIS — I951 Orthostatic hypotension: Secondary | ICD-10-CM | POA: Diagnosis not present

## 2018-03-12 DIAGNOSIS — I6529 Occlusion and stenosis of unspecified carotid artery: Secondary | ICD-10-CM | POA: Diagnosis not present

## 2018-03-12 DIAGNOSIS — I10 Essential (primary) hypertension: Secondary | ICD-10-CM | POA: Diagnosis not present

## 2018-03-12 DIAGNOSIS — G4700 Insomnia, unspecified: Secondary | ICD-10-CM | POA: Diagnosis not present

## 2018-03-12 DIAGNOSIS — Z6821 Body mass index (BMI) 21.0-21.9, adult: Secondary | ICD-10-CM | POA: Diagnosis not present

## 2018-03-15 ENCOUNTER — Encounter: Payer: Self-pay | Admitting: Orthopedic Surgery

## 2018-03-15 ENCOUNTER — Ambulatory Visit: Payer: Medicare Other | Admitting: Orthopedic Surgery

## 2018-03-15 DIAGNOSIS — S46012A Strain of muscle(s) and tendon(s) of the rotator cuff of left shoulder, initial encounter: Secondary | ICD-10-CM

## 2018-03-15 NOTE — Progress Notes (Signed)
NEW PATIENT OFFICE VISIT  Chief Complaint  Patient presents with  . Shoulder Injury    [place] on left shoulder 7 months ago unable to move without pain    67 year old male status post cervical fusion history of CVA affecting his left side however he was in his normal state of health until 7 months ago he fell and injured his left shoulder and since that time he can lift his arm up.  He tried exercises at home he did not improve he saw his primary care doctor who referred him to [place] for management of his left shoulder pain and weakness. Severity of pain moderate  Quality dull ache    Review of Systems  Constitutional: Negative for fever.  Musculoskeletal: Positive for back pain, falls, joint pain and neck pain.  Neurological: Negative for focal weakness.     Past Medical History:  Diagnosis Date  . Carotid artery disease ([place])   . Cervical spondylosis   . Closed head injury 1996  . Essential hypertension, benign   . History of colonic polyps   . History of scarlet fever   . History of seizures   . History of TIAs   . Polysubstance abuse ([place])    History of cocaine in his 30's  . Type 2 diabetes mellitus ([place])    Pt stopped metformin 09/2013    Past Surgical History:  Procedure Laterality Date  . ANTERIOR CERVICAL DECOMP/DISCECTOMY FUSION N/A 12/05/2013   Procedure: ANTERIOR CERVICAL DECOMPRESSION/DISCECTOMY FUSION 2 LEVELS;  Surgeon: Elaina Hoops, MD;  Location: [facility];  Service: Neurosurgery;  Laterality: N/A;  Anterior Cervical Discectomy and Fusion with PEEK Cages and allograft Cervical Four-Five/Five-Six  . FRACTURE SURGERY    . Hand laceration repair    . Left carotid endarterectomy     6/06 - Dr. Scot Dock  . skin graft to leg Left   . Tibia/fibula fracture repair    . VASCULAR SURGERY      Family History  Problem Relation Age of Onset  . Cancer Father   . Coronary artery disease Mother   . Diabetes Mother   . Fibromyalgia Sister   . Cancer Brother   .  Cancer Brother    Social History   Tobacco Use  . Smoking status: Current Every Day Smoker    Packs/day: 1.00    Years: 55.00    Pack years: 55.00    Types: Cigarettes  . Smokeless tobacco: Never Used  . Tobacco comment: smokes 1/2 pack per day now; reports that he starting to quick  Substance Use Topics  . Alcohol use: No    Comment: drinks occasional reports former drinker  . Drug use: Yes    Types: Marijuana, Oxycodone    Allergies  Allergen Reactions  . Aspirin Nausea And Vomiting  . Hydrocodone Nausea Only    Current Meds  Medication Sig  . amitriptyline (ELAVIL) 150 MG tablet TAKE ONE TABLET BY MOUTH AT BEDTIME AS NEEDED FOR SLEEP  . amLODipine (NORVASC) 5 MG tablet Take 1 tablet (5 mg total) by mouth daily.  [place] Kitchen atorvastatin (LIPITOR) 40 MG tablet Take 1 tablet (40 mg total) by mouth daily at 6 PM.  . baclofen (LIORESAL) 10 MG tablet Take 10 mg by mouth 2 (two) times daily.  . clopidogrel (PLAVIX) 75 MG tablet Take 1 tablet (75 mg total) by mouth daily with breakfast.  . losartan-hydrochlorothiazide (HYZAAR) 100-25 MG tablet Take 1 tablet by mouth daily. for high blood pressure  . metFORMIN (  GLUCOPHAGE-XR) 500 MG 24 hr tablet Take 1 tablet (500 mg total) by mouth daily with breakfast.  . Oxycodone HCl 10 MG TABS Take 10 mg by mouth 3 (three) times daily as needed.    BP (!) 141/83   Pulse 91   Ht $R'5\' 10"'yv$  (1.778 m)   Wt 156 lb (70.8 kg)   BMI 22.38 kg/m   Physical Exam  Constitutional: He is oriented to person, place, and time. He appears well-developed and well-nourished.  Vital signs have been reviewed and are stable. Gen. appearance the patient is well-developed and well-nourished with normal grooming and hygiene.   Neurological: He is alert and oriented to person, place, and time. Gait abnormal.  Skin: Skin is warm and dry. No erythema.  Psychiatric: He has a normal mood and affect.  Vitals reviewed.   Right Shoulder Exam   Tenderness  The patient is  experiencing no tenderness.  Range of Motion  The patient has normal right shoulder ROM.  Muscle Strength  The patient has normal right shoulder strength.  Tests  Apprehension: negative  Other  Erythema: absent Sensation: normal Pulse: present   Left Shoulder Exam   Tenderness  The patient is experiencing tenderness in the acromion.  Range of Motion  Active abduction: abnormal  Passive abduction: abnormal  External rotation: abnormal  Forward flexion:  50 abnormal   Tests  Apprehension: negative Impingement: positive Drop arm: positive  Other  Erythema: absent Scars: absent Sensation: normal Pulse: present      C-spine incision from fusion decreased range of motion crepitance no increase in muscle tension or contracture   MEDICAL DECISION SECTION  Xrays were done at Outside facility x-rays are on a CD disc that took a picture of the C-spine and the shoulder for the chart.  He has a previous fusion C4-6 with degenerative disc changes above and below the fusion  His shoulder shows arthritis with inferior osteophytes and joint space narrowing probably grade 2 disease  Encounter Diagnosis  Name Primary?  . Traumatic complete tear of left rotator cuff, initial encounter     PLAN: (Rx., injectx, surgery, frx, mri/ct) Patient has a traumatic injury to the left shoulder cannot lift his arm recommend MRI to check for rotator cuff tear  No orders of the defined types were placed in this encounter.   Arther Abbott, MD  03/15/2018 2:53 PM

## 2018-03-16 ENCOUNTER — Telehealth: Payer: Self-pay | Admitting: Radiology

## 2018-03-16 NOTE — Telephone Encounter (Signed)
MRI scheduled for Oct 28th 4:00 arrive 3:30   Also needs follow up appointment with Dr Aline Brochure  Left message for him to call back

## 2018-03-16 NOTE — Telephone Encounter (Signed)
Angie spoke to him and gave him the MRI information and the follow up appt.

## 2018-03-22 ENCOUNTER — Ambulatory Visit (HOSPITAL_COMMUNITY)
Admission: RE | Admit: 2018-03-22 | Discharge: 2018-03-22 | Disposition: A | Discharge status: Home / Self Care | Payer: Medicare Other | Source: Ambulatory Visit | Attending: Orthopedic Surgery | Admitting: Orthopedic Surgery

## 2018-03-22 DIAGNOSIS — S46012A Strain of muscle(s) and tendon(s) of the rotator cuff of left shoulder, initial encounter: Secondary | ICD-10-CM

## 2018-03-22 DIAGNOSIS — M75122 Complete rotator cuff tear or rupture of left shoulder, not specified as traumatic: Secondary | ICD-10-CM | POA: Diagnosis not present

## 2018-03-24 ENCOUNTER — Encounter: Payer: Self-pay | Admitting: Orthopedic Surgery

## 2018-03-24 ENCOUNTER — Ambulatory Visit: Payer: Medicare Other | Admitting: Orthopedic Surgery

## 2018-03-24 VITALS — BP 174/104 | HR 102 | Ht 70.0 in | Wt 156.0 lb

## 2018-03-24 DIAGNOSIS — G894 Chronic pain syndrome: Secondary | ICD-10-CM | POA: Diagnosis not present

## 2018-03-24 DIAGNOSIS — Z79899 Other long term (current) drug therapy: Secondary | ICD-10-CM | POA: Diagnosis not present

## 2018-03-24 DIAGNOSIS — S46012D Strain of muscle(s) and tendon(s) of the rotator cuff of left shoulder, subsequent encounter: Secondary | ICD-10-CM | POA: Diagnosis not present

## 2018-03-24 DIAGNOSIS — M5136 Other intervertebral disc degeneration, lumbar region: Secondary | ICD-10-CM | POA: Diagnosis not present

## 2018-03-24 DIAGNOSIS — M19019 Primary osteoarthritis, unspecified shoulder: Secondary | ICD-10-CM

## 2018-03-24 NOTE — Progress Notes (Signed)
Chief Complaint  Patient presents with  . Shoulder Pain    left   . Results    review MRI left shoulder     67 year old male with history of CVA affected his left side presents back with his MRI which shows he has a torn rotator cuff and osteoarthritis of the left shoulder as seen on x-ray  He says he is in a lot of pain is followed by the pain clinic.  Review of Systems  Musculoskeletal: Positive for joint pain.  Neurological: Positive for weakness.    BP (!) 174/104   Pulse (!) 102   Ht $R'5\' 10"'nF$  (1.778 m)   Wt 156 lb (70.8 kg)   BMI 22.38 kg/m  Physical Exam  Constitutional: He is oriented to person, place, and time. He appears well-developed and well-nourished.  Vital signs have been reviewed and are stable. Gen. appearance the patient is well-developed and well-nourished with normal grooming and hygiene.   Neurological: He is alert and oriented to person, place, and time.  Skin: Skin is warm and dry. No erythema.  Psychiatric: He has a normal mood and affect.  Vitals reviewed.  Internal and external rotation strength is 4-5/5 external rotation is 30 degrees abduction strength is 4 out of 5, painful range of motion is noted  CLINICAL DATA:  Left shoulder pain and limited range of motion for 3 months.   EXAM: MRI OF THE LEFT SHOULDER WITHOUT CONTRAST   TECHNIQUE: Multiplanar, multisequence MR imaging of the shoulder was performed. No intravenous contrast was administered.   COMPARISON:  None.   FINDINGS: Despite efforts by the technologist and patient, motion artifact is present on today's exam and could not be eliminated. This reduces exam sensitivity and specificity.   Rotator cuff: Full-thickness partial width tear of the supraspinatus tendon centrally with the visualized cap up to 0.7 cm in length. There is also generalized thinning of the supraspinatus tendon suggesting partial thickness articular surface tearing and some mild tendon delamination. Mild  supraspinatus, infraspinatus, and subscapularis tendinopathy.   Muscles:  Unremarkable   Biceps long head: Modest expansion and increased signal in the intra-articular segment compatible with moderate tendinopathy.   Acromioclavicular Joint: Mild spurring and minimal edema signal within the joint itself. Type I acromion. Small but abnormal amount of fluid in the subacromial subdeltoid bursa.   Glenohumeral Joint: Moderate to severe degenerative chondral thinning in portions of the glenohumeral joint with inferior spurring of the glenoid and humeral head, and confluent degenerative subcortical cyst formation in the anterior and inferior glenoid.   Labrum: Indistinct inferior and anterior inferior labrum favoring degeneration, difficult to exclude labral tear on some images such as image 11/4 in the inferior labrum based on the linear accentuated signal.   Bones: No significant extra-articular osseous abnormalities identified.   Other: No supplemental non-categorized findings.   IMPRESSION: 1. Full-thickness partial width tear of the distal supraspinatus tendon centrally, with associated partial-thickness articular surface tearing causing thinning of the tendon distally. 2. Mild supraspinatus, infraspinatus, and subscapularis tendinopathy. Moderate biceps tendinopathy. 3. Moderate to severe degenerative glenohumeral arthropathy. 4. Degeneration of the inferior and anterior inferior labrum, making it difficult to exclude a tear in this vicinity. 5. Confluent degenerative subcortical cyst formation along the anterior inferior glenoid.     Electronically Signed   By: Van Clines M.D.   On: 03/23/2018 08:16    Encounter Diagnoses  Name Primary?  . Traumatic complete tear of left rotator cuff, subsequent encounter Yes  . Arthritis, shoulder  region     I discussed his situation he has osteoarthritis and a repairable cuff tear probably would do best with a total  shoulder if he did not have the left sided CVA.  He wants to work on his own therapy so we can let him do that for 2 months were going to see him again and reassess his situation

## 2018-03-26 DIAGNOSIS — I951 Orthostatic hypotension: Secondary | ICD-10-CM | POA: Diagnosis not present

## 2018-04-21 DIAGNOSIS — G894 Chronic pain syndrome: Secondary | ICD-10-CM | POA: Diagnosis not present

## 2018-04-21 DIAGNOSIS — Z79899 Other long term (current) drug therapy: Secondary | ICD-10-CM | POA: Diagnosis not present

## 2018-04-21 DIAGNOSIS — M5136 Other intervertebral disc degeneration, lumbar region: Secondary | ICD-10-CM | POA: Diagnosis not present

## 2018-04-21 DIAGNOSIS — M1990 Unspecified osteoarthritis, unspecified site: Secondary | ICD-10-CM | POA: Diagnosis not present

## 2018-05-09 DIAGNOSIS — Z1211 Encounter for screening for malignant neoplasm of colon: Secondary | ICD-10-CM | POA: Diagnosis not present

## 2018-05-09 DIAGNOSIS — Z1212 Encounter for screening for malignant neoplasm of rectum: Secondary | ICD-10-CM | POA: Diagnosis not present

## 2018-05-10 DIAGNOSIS — H25812 Combined forms of age-related cataract, left eye: Secondary | ICD-10-CM | POA: Diagnosis not present

## 2018-05-10 DIAGNOSIS — H25813 Combined forms of age-related cataract, bilateral: Secondary | ICD-10-CM | POA: Diagnosis not present

## 2018-05-10 NOTE — Patient Instructions (Signed)
Your procedure is scheduled on: 05/24/2018  Report to [facility] at  36  AM.  Call this number if you have problems the morning of surgery: [phone]   Do not eat food or drink liquids :After Midnight.      Take these medicines the morning of surgery with A SIP OF WATER: Losartan, oxycodone ( if needed).   Do not wear jewelry, make-up or nail polish.  Do not wear lotions, powders, or perfumes. You may wear deodorant.  Do not shave 48 hours prior to surgery.  Do not bring valuables to the hospital.  Contacts, dentures or bridgework may not be worn into surgery.  Leave suitcase in the car. After surgery it may be brought to your room.  For patients admitted to the hospital, checkout time is 11:00 AM the day of discharge.   Patients discharged the day of surgery will not be allowed to drive home.  :     Please read over the following fact sheets that you were given: Coughing and Deep Breathing, Surgical Site Infection Prevention, Anesthesia Post-op Instructions and Care and Recovery After Surgery    Cataract A cataract is a clouding of the lens of the eye. When a lens becomes cloudy, vision is reduced based on the degree and nature of the clouding. Many cataracts reduce vision to some degree. Some cataracts make people more near-sighted as they develop. Other cataracts increase glare. Cataracts that are ignored and become worse can sometimes look white. The white color can be seen through the pupil. CAUSES   Aging. However, cataracts may occur at any age, even in newborns.   Certain drugs.   Trauma to the eye.   Certain diseases such as diabetes.   Specific eye diseases such as chronic inflammation inside the eye or a sudden attack of a rare form of glaucoma.   Inherited or acquired medical problems.  SYMPTOMS   Gradual, progressive drop in vision in the affected eye.   Severe, rapid visual loss. This most often happens when trauma is the cause.  DIAGNOSIS  To detect a  cataract, an eye doctor examines the lens. Cataracts are best diagnosed with an exam of the eyes with the pupils enlarged (dilated) by drops.  TREATMENT  For an early cataract, vision may improve by using different eyeglasses or stronger lighting. If that does not help your vision, surgery is the only effective treatment. A cataract needs to be surgically removed when vision loss interferes with your everyday activities, such as driving, reading, or watching TV. A cataract may also have to be removed if it prevents examination or treatment of another eye problem. Surgery removes the cloudy lens and usually replaces it with a substitute lens (intraocular lens, IOL).  At a time when both you and your doctor agree, the cataract will be surgically removed. If you have cataracts in both eyes, only one is usually removed at a time. This allows the operated eye to heal and be out of danger from any possible problems after surgery (such as infection or poor wound healing). In rare cases, a cataract may be doing damage to your eye. In these cases, your caregiver may advise surgical removal right away. The vast majority of people who have cataract surgery have better vision afterward. HOME CARE INSTRUCTIONS  If you are not planning surgery, you may be asked to do the following:  Use different eyeglasses.   Use stronger or brighter lighting.   Ask your eye doctor about reducing  your medicine dose or changing medicines if it is thought that a medicine caused your cataract. Changing medicines does not make the cataract go away on its own.   Become familiar with your surroundings. Poor vision can lead to injury. Avoid bumping into things on the affected side. You are at a higher risk for tripping or falling.   Exercise extreme care when driving or operating machinery.   Wear sunglasses if you are sensitive to bright light or experiencing problems with glare.  SEEK IMMEDIATE MEDICAL CARE IF:   You have a  worsening or sudden vision loss.   You notice redness, swelling, or increasing pain in the eye.   You have a fever.  Document Released: 05/12/2005 Document Revised: 05/01/2011 Document Reviewed: 01/03/2011 [facility] Patient Information 2012 [place].PATIENT INSTRUCTIONS POST-ANESTHESIA  IMMEDIATELY FOLLOWING SURGERY:  Do not drive or operate machinery for the first twenty four hours after surgery.  Do not make any important decisions for twenty four hours after surgery or while taking narcotic pain medications or sedatives.  If you develop intractable nausea and vomiting or a severe headache please notify your doctor immediately.  FOLLOW-UP:  Please make an appointment with your surgeon as instructed. You do not need to follow up with anesthesia unless specifically instructed to do so.  WOUND CARE INSTRUCTIONS (if applicable):  Keep a dry clean dressing on the anesthesia/puncture wound site if there is drainage.  Once the wound has quit draining you may leave it open to air.  Generally you should leave the bandage intact for twenty four hours unless there is drainage.  If the epidural site drains for more than 36-48 hours please call the anesthesia department.  QUESTIONS?:  Please feel free to call your physician or the hospital operator if you have any questions, and they will be happy to assist you.

## 2018-05-17 ENCOUNTER — Encounter (HOSPITAL_COMMUNITY): Payer: Self-pay

## 2018-05-17 ENCOUNTER — Other Ambulatory Visit: Payer: Self-pay

## 2018-05-17 ENCOUNTER — Encounter (HOSPITAL_COMMUNITY)
Admission: RE | Admit: 2018-05-17 | Discharge: 2018-05-17 | Disposition: A | Discharge status: Home / Self Care | Payer: Medicare Other | Source: Ambulatory Visit | Attending: Ophthalmology | Admitting: Ophthalmology

## 2018-05-17 DIAGNOSIS — Z01812 Encounter for preprocedural laboratory examination: Secondary | ICD-10-CM | POA: Diagnosis not present

## 2018-05-17 HISTORY — DX: Cerebral infarction, unspecified: I63.9

## 2018-05-17 LAB — BASIC METABOLIC PANEL
ANION GAP: 7 (ref 5–15)
BUN: 9 mg/dL (ref 8–23)
CALCIUM: 9.3 mg/dL (ref 8.9–10.3)
CO2: 25 mmol/L (ref 22–32)
Chloride: 108 mmol/L (ref 98–111)
Creatinine, Ser: 0.96 mg/dL (ref 0.61–1.24)
Glucose, Bld: 147 mg/dL — ABNORMAL HIGH (ref 70–99)
POTASSIUM: 3.7 mmol/L (ref 3.5–5.1)
SODIUM: 140 mmol/L (ref 135–145)

## 2018-05-21 DIAGNOSIS — M1990 Unspecified osteoarthritis, unspecified site: Secondary | ICD-10-CM | POA: Diagnosis not present

## 2018-05-21 DIAGNOSIS — M5136 Other intervertebral disc degeneration, lumbar region: Secondary | ICD-10-CM | POA: Diagnosis not present

## 2018-05-21 DIAGNOSIS — Z79899 Other long term (current) drug therapy: Secondary | ICD-10-CM | POA: Diagnosis not present

## 2018-05-21 DIAGNOSIS — G894 Chronic pain syndrome: Secondary | ICD-10-CM | POA: Diagnosis not present

## 2018-05-24 ENCOUNTER — Encounter (HOSPITAL_COMMUNITY): Payer: Self-pay | Admitting: *Deleted

## 2018-05-24 ENCOUNTER — Ambulatory Visit (HOSPITAL_COMMUNITY)
Admission: RE | Admit: 2018-05-24 | Discharge: 2018-05-24 | Disposition: A | Discharge status: Home / Self Care | Payer: Medicare Other | Attending: Ophthalmology | Admitting: Ophthalmology

## 2018-05-24 ENCOUNTER — Ambulatory Visit (HOSPITAL_COMMUNITY): Payer: Medicare Other | Admitting: Anesthesiology

## 2018-05-24 ENCOUNTER — Encounter (HOSPITAL_COMMUNITY)
Admission: RE | Disposition: A | Discharge status: Home / Self Care | Payer: Self-pay | Source: Home / Self Care | Attending: Ophthalmology

## 2018-05-24 DIAGNOSIS — F17210 Nicotine dependence, cigarettes, uncomplicated: Secondary | ICD-10-CM | POA: Diagnosis not present

## 2018-05-24 DIAGNOSIS — I739 Peripheral vascular disease, unspecified: Secondary | ICD-10-CM | POA: Insufficient documentation

## 2018-05-24 DIAGNOSIS — Z79899 Other long term (current) drug therapy: Secondary | ICD-10-CM | POA: Insufficient documentation

## 2018-05-24 DIAGNOSIS — H25812 Combined forms of age-related cataract, left eye: Secondary | ICD-10-CM | POA: Insufficient documentation

## 2018-05-24 DIAGNOSIS — I10 Essential (primary) hypertension: Secondary | ICD-10-CM | POA: Diagnosis not present

## 2018-05-24 HISTORY — PX: CATARACT EXTRACTION W/PHACO: SHX586

## 2018-05-24 LAB — GLUCOSE, CAPILLARY: Glucose-Capillary: 112 mg/dL — ABNORMAL HIGH (ref 70–99)

## 2018-05-24 SURGERY — PHACOEMULSIFICATION, CATARACT, WITH IOL INSERTION
Anesthesia: Monitor Anesthesia Care | Site: Eye | Laterality: Left

## 2018-05-24 MED ORDER — BSS IO SOLN
INTRAOCULAR | Status: DC | PRN
  Administered 2018-05-24: 15 mL via INTRAOCULAR

## 2018-05-24 MED ORDER — MIDAZOLAM HCL 2 MG/2ML IJ SOLN
INTRAMUSCULAR | Status: DC | PRN
  Administered 2018-05-24: 2 mg via INTRAVENOUS

## 2018-05-24 MED ORDER — TETRACAINE HCL 0.5 % OP SOLN
1.0000 [drp] | OPHTHALMIC | Status: AC
  Administered 2018-05-24 (×3): 1 [drp] via OPHTHALMIC

## 2018-05-24 MED ORDER — POVIDONE-IODINE 5 % OP SOLN
OPHTHALMIC | Status: DC | PRN
  Administered 2018-05-24: 1 via OPHTHALMIC

## 2018-05-24 MED ORDER — PHENYLEPHRINE HCL 2.5 % OP SOLN
1.0000 [drp] | OPHTHALMIC | Status: AC
  Administered 2018-05-24 (×3): 1 [drp] via OPHTHALMIC

## 2018-05-24 MED ORDER — NEOMYCIN-POLYMYXIN-DEXAMETH 3.5-10000-0.1 OP SUSP
OPHTHALMIC | Status: DC | PRN
  Administered 2018-05-24: 2 [drp] via OPHTHALMIC

## 2018-05-24 MED ORDER — SODIUM CHLORIDE 0.9% FLUSH
10.0000 mL | INTRAVENOUS | Status: DC | PRN
  Administered 2018-05-24: 10 mL via INTRAVENOUS

## 2018-05-24 MED ORDER — CYCLOPENTOLATE-PHENYLEPHRINE 0.2-1 % OP SOLN
1.0000 [drp] | OPHTHALMIC | Status: AC
  Administered 2018-05-24 (×3): 1 [drp] via OPHTHALMIC

## 2018-05-24 MED ORDER — LIDOCAINE HCL (PF) 1 % IJ SOLN
INTRAOCULAR | Status: DC | PRN
  Administered 2018-05-24: .9 mL via OPHTHALMIC

## 2018-05-24 MED ORDER — LIDOCAINE HCL 3.5 % OP GEL
1.0000 "application " | Freq: Once | OPHTHALMIC | Status: AC
  Administered 2018-05-24: 1 via OPHTHALMIC

## 2018-05-24 MED ORDER — MIDAZOLAM HCL 2 MG/2ML IJ SOLN
INTRAMUSCULAR | Status: AC
  Filled 2018-05-24: qty 2

## 2018-05-24 MED ORDER — EPINEPHRINE PF 1 MG/ML IJ SOLN
INTRAMUSCULAR | Status: AC
  Filled 2018-05-24: qty 2

## 2018-05-24 MED ORDER — PROVISC 10 MG/ML IO SOLN
INTRAOCULAR | Status: DC | PRN
  Administered 2018-05-24: 0.85 mL via INTRAOCULAR

## 2018-05-24 MED ORDER — EPINEPHRINE PF 1 MG/ML IJ SOLN
INTRAOCULAR | Status: DC | PRN
  Administered 2018-05-24: 500 mL

## 2018-05-24 SURGICAL SUPPLY — 14 items
CLOTH BEACON ORANGE TIMEOUT ST (SAFETY) ×1 IMPLANT
EYE SHIELD UNIVERSAL CLEAR (GAUZE/BANDAGES/DRESSINGS) ×1 IMPLANT
GLOVE BIOGEL PI IND STRL 6.5 (GLOVE) IMPLANT
GLOVE BIOGEL PI IND STRL 7.0 (GLOVE) IMPLANT
GLOVE BIOGEL PI INDICATOR 6.5 (GLOVE) ×1
GLOVE BIOGEL PI INDICATOR 7.0 (GLOVE) ×1
LENS ALC ACRYL/TECN (Ophthalmic Related) ×1 IMPLANT
NDL HYPO 18GX1.5 BLUNT FILL (NEEDLE) IMPLANT
NEEDLE HYPO 18GX1.5 BLUNT FILL (NEEDLE) ×2 IMPLANT
PAD ARMBOARD 7.5X6 YLW CONV (MISCELLANEOUS) ×1 IMPLANT
SYRINGE LUER LOK 1CC (MISCELLANEOUS) ×1 IMPLANT
TAPE SURG TRANSPORE 1 IN (GAUZE/BANDAGES/DRESSINGS) IMPLANT
TAPE SURGICAL TRANSPORE 1 IN (GAUZE/BANDAGES/DRESSINGS) ×1
WATER STERILE IRR 250ML POUR (IV SOLUTION) ×1 IMPLANT

## 2018-05-24 NOTE — Transfer of Care (Signed)
Immediate Anesthesia Transfer of Care Note  Patient: Ryan Butler  Procedure(s) Performed: CATARACT EXTRACTION PHACO AND INTRAOCULAR LENS PLACEMENT (IOC) (Left Eye)  Patient Location: Short Stay  Anesthesia Type:MAC  Level of Consciousness: awake, alert  and patient cooperative  Airway & Oxygen Therapy: Patient Spontanous Breathing  Post-op Assessment: Report given to RN and Post -op Vital signs reviewed and stable  Post vital signs: Reviewed and stable  Last Vitals:  Vitals Value Taken Time  BP    Temp    Pulse    Resp    SpO2      Last Pain:  Vitals:   05/24/18 0743  TempSrc: Oral  PainSc: 0-No pain         Complications: No apparent anesthesia complications

## 2018-05-24 NOTE — Anesthesia Procedure Notes (Signed)
Procedure Name: MAC Date/Time: 05/24/2018 8:16 AM Performed by: Vista Deck, CRNA Pre-anesthesia Checklist: Patient identified, Emergency Drugs available, Suction available, Timeout performed and Patient being monitored Patient Re-evaluated:Patient Re-evaluated prior to induction Oxygen Delivery Method: Nasal Cannula

## 2018-05-24 NOTE — Discharge Instructions (Signed)

## 2018-05-24 NOTE — Anesthesia Preprocedure Evaluation (Signed)
Anesthesia Evaluation  Patient identified by MRN, date of birth, ID band Patient awake    Reviewed: Allergy & Precautions, H&P , NPO status , Patient's Chart, lab work & pertinent test results  Airway Mallampati: II  TM Distance: >3 FB Neck ROM: full    Dental  (+) Upper Dentures, Lower Dentures   Pulmonary neg pulmonary ROS, Current Smoker,    Pulmonary exam normal breath sounds clear to auscultation       Cardiovascular Exercise Tolerance: Good hypertension, + Peripheral Vascular Disease   Rhythm:regular Rate:Normal     Neuro/Psych  Headaches, CVA negative psych ROS   GI/Hepatic negative GI ROS, Neg liver ROS,   Endo/Other  negative endocrine ROSdiabetes  Renal/GU negative Renal ROS  negative genitourinary   Musculoskeletal   Abdominal   Peds  Hematology negative hematology ROS (+)   Anesthesia Other Findings   Reproductive/Obstetrics negative OB ROS                             Anesthesia Physical Anesthesia Plan  ASA: III  Anesthesia Plan: MAC   Post-op Pain Management:    Induction:   PONV Risk Score and Plan:   Airway Management Planned:   Additional Equipment:   Intra-op Plan:   Post-operative Plan:   Informed Consent: I have reviewed the patients History and Physical, chart, labs and discussed the procedure including the risks, benefits and alternatives for the proposed anesthesia with the patient or authorized representative who has indicated his/her understanding and acceptance.   Dental Advisory Given  Plan Discussed with: CRNA  Anesthesia Plan Comments:         Anesthesia Quick Evaluation

## 2018-05-24 NOTE — H&P (Signed)
I have reviewed the H&P, the patient was re-examined, and I have identified no interval changes in medical condition and plan of care since the history and physical of record  

## 2018-05-24 NOTE — Anesthesia Postprocedure Evaluation (Signed)
Anesthesia Post Note  Patient: Ryan Butler  Procedure(s) Performed: CATARACT EXTRACTION PHACO AND INTRAOCULAR LENS PLACEMENT (IOC) (Left Eye)  Patient location during evaluation: Short Stay Anesthesia Type: MAC Level of consciousness: awake and alert and patient cooperative Pain management: satisfactory to patient Vital Signs Assessment: post-procedure vital signs reviewed and stable Respiratory status: spontaneous breathing Cardiovascular status: stable Postop Assessment: no apparent nausea or vomiting Anesthetic complications: no     Last Vitals:  Vitals:   05/24/18 0743  BP: (!) 147/78  Resp: 20  Temp: 36.5 C    Last Pain:  Vitals:   05/24/18 0743  TempSrc: Oral  PainSc: 0-No pain                 Su Duma

## 2018-05-24 NOTE — Op Note (Signed)
Date of Admission: 05/24/2018  Date of Surgery: 05/24/2018  Pre-Op Dx: Cataract Left  Eye  Post-Op Dx: Senile Combined Cataract  Left  Eye,  Dx Code H06.237  Surgeon: Tonny Branch, M.D.  Assistants: None  Anesthesia: Topical with MAC  Indications: Painless, progressive loss of vision with compromise of daily activities.  Surgery: Cataract Extraction with Intraocular lens Implant Left Eye  Discription: The patient had dilating drops and viscous lidocaine placed into the Left eye in the pre-op holding area. After transfer to the operating room, a time out was performed. The patient was then prepped and draped. Beginning with a 61m blade a paracentesis port was made at the surgeon's 2 o'clock position. The anterior chamber was then filled with 1% non-preserved lidocaine with epinepherine. This was followed by filling the anterior chamber with Provisc.  A 2.484mkeratome blade was used to make a clear corneal incision at the temporal limbus.  A bent cystatome needle was used to create a continuous tear capsulotomy. Hydrodissection was performed with balanced salt solution on a Fine canula. The lens nucleus was then removed using the phacoemulsification handpiece. Residual cortex was removed with the I&A handpiece. The anterior chamber and capsular bag were refilled with Provisc. A posterior chamber intraocular lens was placed into the capsular bag with it's injector. The implant was positioned with the Kuglan hook. The Provisc was then removed from the anterior chamber and capsular bag with the I&A handpiece. Stromal hydration of the main incision and paracentesis port was performed with BSS on a Fine canula. The wounds were tested for leak which was negative. The patient tolerated the procedure well. There were no operative complications. The patient was then transferred to the recovery room in stable condition.  Complications: None  Specimen: None  EBL: None  Prosthetic device: J[employer] Technis,  PCB00, power 21.0, SN [id number]

## 2018-05-24 NOTE — Addendum Note (Signed)
Addendum  created 05/24/18 0845 by Vista Deck, CRNA   Intraprocedure Meds edited

## 2018-05-25 ENCOUNTER — Encounter (HOSPITAL_COMMUNITY): Payer: Self-pay | Admitting: Ophthalmology

## 2018-05-31 ENCOUNTER — Ambulatory Visit: Payer: Medicare Other | Admitting: Orthopedic Surgery

## 2018-05-31 ENCOUNTER — Encounter: Payer: Self-pay | Admitting: Orthopedic Surgery

## 2018-05-31 VITALS — BP 158/84 | HR 91 | Ht 70.0 in | Wt 156.0 lb

## 2018-05-31 DIAGNOSIS — S46012D Strain of muscle(s) and tendon(s) of the rotator cuff of left shoulder, subsequent encounter: Secondary | ICD-10-CM | POA: Diagnosis not present

## 2018-05-31 NOTE — Progress Notes (Signed)
Chief Complaint  Patient presents with  . Shoulder Pain    left    68 year old male history of CVA left side had an MRI of his left shoulder shoulder rotator cuff tear  He did not want surgery at the time he wanted to try physical therapy which he did  He still complains of pain and loss of function and range of motion  Review of systems he is currently undergoing treatments for his eyes and will need a second surgery  BP (!) 158/84   Pulse 91   Ht $R'5\' 10"'zv$  (1.778 m)   Wt 156 lb (70.8 kg)   BMI 22.38 kg/m   His exam shows a thin male ambulates with a right-handed cane.  He is awake and alert he is oriented his mood is pleasant  Left shoulder Proximal tenderness No instability Weak abduction and flexion He has active flexion of 120 degrees and active abduction of 90 degrees Sensation proximal deltoid area normal Distal pulses intact  His rotator cuff tear is repairable with partial thickness articular surface tear full-thickness supraspinatus tendon tear tendinopathy in the biceps tendon and 3 of the rotator cuff tendons glenohumeral arthritis is noted  He would like to proceed with replacement of the shoulder if that is possible we will make a referral to try to make that happen  [place] were done at Outside facility x-rays are on a CD disc that took a picture of the C-spine and the shoulder for the chart.  He has a previous fusion C4-6 with degenerative disc changes above and below the fusion   His shoulder shows arthritis with inferior osteophytes and joint space narrowing probably grade 2 disease CLINICAL DATA:  Left shoulder pain and limited range of motion for 3 months.   EXAM: MRI OF THE LEFT SHOULDER WITHOUT CONTRAST   TECHNIQUE: Multiplanar, multisequence MR imaging of the shoulder was performed. No intravenous contrast was administered.   COMPARISON:  None.   FINDINGS: Despite efforts by the technologist and patient, motion artifact  is present on today's exam and could not be eliminated. This reduces exam sensitivity and specificity.   Rotator cuff: Full-thickness partial width tear of the supraspinatus tendon centrally with the visualized cap up to 0.7 cm in length. There is also generalized thinning of the supraspinatus tendon suggesting partial thickness articular surface tearing and some mild tendon delamination. Mild supraspinatus, infraspinatus, and subscapularis tendinopathy.   Muscles:  Unremarkable   Biceps long head: Modest expansion and increased signal in the intra-articular segment compatible with moderate tendinopathy.   Acromioclavicular Joint: Mild spurring and minimal edema signal within the joint itself. Type I acromion. Small but abnormal amount of fluid in the subacromial subdeltoid bursa.   Glenohumeral Joint: Moderate to severe degenerative chondral thinning in portions of the glenohumeral joint with inferior spurring of the glenoid and humeral head, and confluent degenerative subcortical cyst formation in the anterior and inferior glenoid.   Labrum: Indistinct inferior and anterior inferior labrum favoring degeneration, difficult to exclude labral tear on some images such as image 11/4 in the inferior labrum based on the linear accentuated signal.   Bones: No significant extra-articular osseous abnormalities identified.   Other: No supplemental non-categorized findings.   IMPRESSION: 1. Full-thickness partial width tear of the distal supraspinatus tendon centrally, with associated partial-thickness articular surface tearing causing thinning of the tendon distally. 2. Mild supraspinatus, infraspinatus, and subscapularis tendinopathy. Moderate biceps tendinopathy. 3. Moderate to severe degenerative glenohumeral arthropathy. 4. Degeneration of the  inferior and anterior inferior labrum, making it difficult to exclude a tear in this vicinity. 5. Confluent degenerative subcortical  cyst formation along the anterior inferior glenoid.     Electronically Signed   By: Van Clines M.D.   On: 03/23/2018 08:16     Encounter Diagnosis  Name Primary?  . Traumatic complete tear of left rotator cuff, subsequent encounter Yes    Referral for surgery  Total shoulder arthroplasty

## 2018-05-31 NOTE — Addendum Note (Signed)
Addended byCandice Camp on: 05/31/2018 02:37 PM   Modules accepted: Orders

## 2018-05-31 NOTE — Patient Instructions (Signed)
Shoulder Joint Replacement Shoulder joint replacement is surgery to replace damaged parts of the shoulder joint with artificial parts (prostheses). Two parts may be used to replace this joint:  The humeral component replaces the head of the upper arm bone (humerus). This is a rounded ball that is attached to a stem that fits into the humerus.  The glenoid component replaces the socket (glenoid depression). The prostheses are usually made of metal and plastic. Depending on the damage to your shoulder, the surgeon may replace just the humeral head (hemiarthroplasty) or replace both the humeral head and the glenoid (total shoulder replacement). The surrounding muscles and tendons hold the prosthetic parts in place. This procedure may be done to relieve joint pain or to treat severe shoulder fractures or arthritis. This surgery may be done if other non-surgical treatments have not worked. Tell a health care provider about:  Any allergies you have.  All medicines you are taking, including vitamins, herbs, eye drops, creams, and over-the-counter medicines.  Any problems you or family members have had with anesthetic medicines.  Any blood disorders you have.  Any surgeries you have had.  Any medical conditions you have.  Whether you are pregnant or may be pregnant. What are the risks? Generally, this is a safe procedure. However, problems may occur, including:  Infection.  Bleeding.  Allergic reactions to medicines.  Damage to other structures, organs, or nerves.  Fracture of the upper arm bone during or after surgery.  Instability of the shoulder after surgery.  Loosening of the glenoid component over time.  Unusual bone growth.  Failure of bone healing after surgery. What happens before the procedure? Medicines  Ask your health care provider about: ? Changing or stopping your regular medicines. This is especially important if you are taking diabetes medicines or blood  thinners. ? Taking medicines such as aspirin and ibuprofen. These medicines can thin your blood. Do not take these medicines before your procedure if your health care provider instructs you not to.  You may be given antibiotic medicine to help prevent infection. Staying hydrated Follow instructions from your health care provider about hydration, which may include:  Up to 2 hours before the procedure - you may continue to drink clear liquids, such as water, clear fruit juice, black coffee, and plain tea. Eating and drinking restrictions Follow instructions from your health care provider about eating and drinking, which may include:  8 hours before the procedure - stop eating heavy meals or foods such as meat, fried foods, or fatty foods.  6 hours before the procedure - stop eating light meals or foods, such as toast or cereal.  6 hours before the procedure - stop drinking milk or drinks that contain milk.  2 hours before the procedure - stop drinking clear liquids. General instructions  Plan to have someone take you home from the hospital or clinic.  Plan to have someone with you for 24 hours after the procedure. It is also recommended that you have someone to assist you at home for the first few weeks after the procedure.  Do not use any products that contain nicotine or tobacco, such as cigarettes and e-cigarettes. If you need help quitting, ask your healthcare provider.  Ask your health care provider how your surgical site will be marked or identified. What happens during the procedure?  To reduce your risk of infection: ? Your health care team will wash or sanitize their hands. ? Your skin will be washed with soap. ? Hair  may be removed from the surgical area.  An IV tube will be inserted into one of your veins.  You will be given one or more of the following: ? A medicine to help you relax (sedative). ? A medicine to make you fall asleep (general anesthetic). ? A medicine  that is injected into your shoulder area to numb everything around the injection site (regional anesthetic).  An incision will be made on the front of the shoulder from the collarbone (clavicle) to the point where the shoulder muscle (deltoid) attaches to the upper arm bone.  The upper arm bone will be removed from the socket to expose the ball-like end of the upper arm.  The center cavity of the humerus bone will be cleaned and enlarged to create a hollow area that matches the shape of the implant stem. The top end of the bone will be smoothed so the stem will be level with the bone surface when it is inserted.  If the ball of the prosthesis is a separate piece, the proper size will be selected and attached.  If the socket portion of the joint is healthy and the surrounding muscles are in good condition, the surgeon may decide not to replace it. However, if the socket needs to be replaced: ? The surgeon will prepare the socket surface by removing the remaining damaged cartilage. ? The socket bone will be gently reshaped to fit the implant. ? The glenoid component will be implanted and cemented into position.  The arm bone, with its new artificial head, will be replaced in the socket. The surgeon will reattach the supporting tendons and close the incision with sutures or stitches.  A bandage (dressing) will be placed over your incision.  Your arm will be placed in a sling or immobilizer, and a support pillow will be placed under your elbow.  Tubes will be placed to remove excess drainage. These are usually removed after a couple of days. The procedure may vary among health care providers and hospitals. What happens after the procedure?  Your blood pressure, heart rate, breathing rate, and blood oxygen level will be monitored until the medicines you were given have worn off.  Your arm will be numb if you were given a regional anesthetic. This may last until the next day.  You will be  given pain medicine as needed.  Your arm and shoulder will be stiff and bruised. This will improve over time.  An icing device will be placed around your shoulder. This helps to control pain and swelling.  Your arm will be in a sling or immobilizer. You will need to wear this for 2-4 weeks after surgery or as told by your health care provider.  Your health care team may begin to show you exercises for your shoulder.  Do not use your arm to push yourself up in bed or from a chair.  Do not lift anything that is heavier than a cup of coffee.  Do not drive for 24 hours if you were given a sedative. Ask your health care provider when it is safe for you to drive. Summary  Shoulder joint replacement is surgery to replace damaged parts of the shoulder joint with artificial parts (prostheses).  The surgeon may replace just the humeral head (hemiarthroplasty) or replace both the humeral head and the glenoid (total shoulder replacement), based on the damage to your shoulder.  Taking medicine, icing the painful area, and doing exercises as told by your health care provider  will help control your shoulder pain, swelling, and stiffness after surgery.  After the procedure, do not lift anything that is heavier than a cup of coffee and do not use your arm to push yourself up in bed or from a chair. This information is not intended to replace advice given to you by your health care provider. Make sure you discuss any questions you have with your health care provider. Document Released: 02/08/2003 Document Revised: 02/25/2016 Document Reviewed: 02/25/2016 Elsevier Interactive Patient Education  2019 [employer].

## 2018-06-09 ENCOUNTER — Ambulatory Visit (INDEPENDENT_AMBULATORY_CARE_PROVIDER_SITE_OTHER): Payer: Medicare Other | Admitting: Orthopedic Surgery

## 2018-06-22 DIAGNOSIS — M5136 Other intervertebral disc degeneration, lumbar region: Secondary | ICD-10-CM | POA: Diagnosis not present

## 2018-06-22 DIAGNOSIS — Z79899 Other long term (current) drug therapy: Secondary | ICD-10-CM | POA: Diagnosis not present

## 2018-06-22 DIAGNOSIS — G894 Chronic pain syndrome: Secondary | ICD-10-CM | POA: Diagnosis not present

## 2018-07-22 DIAGNOSIS — M5136 Other intervertebral disc degeneration, lumbar region: Secondary | ICD-10-CM | POA: Diagnosis not present

## 2018-07-22 DIAGNOSIS — G894 Chronic pain syndrome: Secondary | ICD-10-CM | POA: Diagnosis not present

## 2018-07-22 DIAGNOSIS — Z79899 Other long term (current) drug therapy: Secondary | ICD-10-CM | POA: Diagnosis not present

## 2018-07-23 DIAGNOSIS — I10 Essential (primary) hypertension: Secondary | ICD-10-CM | POA: Diagnosis not present

## 2018-07-23 DIAGNOSIS — E782 Mixed hyperlipidemia: Secondary | ICD-10-CM | POA: Diagnosis not present

## 2018-07-23 DIAGNOSIS — E119 Type 2 diabetes mellitus without complications: Secondary | ICD-10-CM | POA: Diagnosis not present

## 2018-07-23 DIAGNOSIS — E785 Hyperlipidemia, unspecified: Secondary | ICD-10-CM | POA: Diagnosis not present

## 2018-07-23 DIAGNOSIS — E1165 Type 2 diabetes mellitus with hyperglycemia: Secondary | ICD-10-CM | POA: Diagnosis not present

## 2018-07-27 DIAGNOSIS — I129 Hypertensive chronic kidney disease with stage 1 through stage 4 chronic kidney disease, or unspecified chronic kidney disease: Secondary | ICD-10-CM | POA: Diagnosis not present

## 2018-07-27 DIAGNOSIS — N182 Chronic kidney disease, stage 2 (mild): Secondary | ICD-10-CM | POA: Diagnosis not present

## 2018-07-27 DIAGNOSIS — E1169 Type 2 diabetes mellitus with other specified complication: Secondary | ICD-10-CM | POA: Diagnosis not present

## 2018-07-27 DIAGNOSIS — G4700 Insomnia, unspecified: Secondary | ICD-10-CM | POA: Diagnosis not present

## 2018-07-27 DIAGNOSIS — E782 Mixed hyperlipidemia: Secondary | ICD-10-CM | POA: Diagnosis not present

## 2018-08-20 DIAGNOSIS — M5136 Other intervertebral disc degeneration, lumbar region: Secondary | ICD-10-CM | POA: Diagnosis not present

## 2018-08-20 DIAGNOSIS — Z79899 Other long term (current) drug therapy: Secondary | ICD-10-CM | POA: Diagnosis not present

## 2018-08-20 DIAGNOSIS — W19XXXA Unspecified fall, initial encounter: Secondary | ICD-10-CM | POA: Diagnosis not present

## 2018-08-20 DIAGNOSIS — G894 Chronic pain syndrome: Secondary | ICD-10-CM | POA: Diagnosis not present

## 2018-08-25 DIAGNOSIS — R0781 Pleurodynia: Secondary | ICD-10-CM | POA: Diagnosis not present

## 2018-08-25 DIAGNOSIS — W19XXXA Unspecified fall, initial encounter: Secondary | ICD-10-CM | POA: Diagnosis not present

## 2018-08-25 DIAGNOSIS — R109 Unspecified abdominal pain: Secondary | ICD-10-CM | POA: Diagnosis not present

## 2018-09-20 DIAGNOSIS — M5136 Other intervertebral disc degeneration, lumbar region: Secondary | ICD-10-CM | POA: Diagnosis not present

## 2018-09-20 DIAGNOSIS — Z79899 Other long term (current) drug therapy: Secondary | ICD-10-CM | POA: Diagnosis not present

## 2018-09-20 DIAGNOSIS — G894 Chronic pain syndrome: Secondary | ICD-10-CM | POA: Diagnosis not present

## 2018-10-06 DIAGNOSIS — Z0000 Encounter for general adult medical examination without abnormal findings: Secondary | ICD-10-CM | POA: Diagnosis not present

## 2018-10-20 DIAGNOSIS — G894 Chronic pain syndrome: Secondary | ICD-10-CM | POA: Diagnosis not present

## 2018-10-20 DIAGNOSIS — M5136 Other intervertebral disc degeneration, lumbar region: Secondary | ICD-10-CM | POA: Diagnosis not present

## 2018-10-20 DIAGNOSIS — Z79899 Other long term (current) drug therapy: Secondary | ICD-10-CM | POA: Diagnosis not present

## 2018-11-19 DIAGNOSIS — G894 Chronic pain syndrome: Secondary | ICD-10-CM | POA: Diagnosis not present

## 2018-11-19 DIAGNOSIS — Z79899 Other long term (current) drug therapy: Secondary | ICD-10-CM | POA: Diagnosis not present

## 2018-11-19 DIAGNOSIS — M5136 Other intervertebral disc degeneration, lumbar region: Secondary | ICD-10-CM | POA: Diagnosis not present

## 2018-12-08 ENCOUNTER — Other Ambulatory Visit: Payer: Self-pay

## 2018-12-08 NOTE — Patient Outreach (Signed)
[facility]) Care Management  12/08/2018  JASSON SIEGMANN Apr 16, 1951 [id number]   Medication Adherence call to Mr. Kem Hensen HIPPA Compliant Voice message left with a call back number. Mr. Macqueen is showing past due on Atorvastatin 40 mg under [place].   [address] Management Direct Dial [phone]  Fax [phone] [email or web address]

## 2018-12-22 DIAGNOSIS — M5136 Other intervertebral disc degeneration, lumbar region: Secondary | ICD-10-CM | POA: Diagnosis not present

## 2018-12-22 DIAGNOSIS — G894 Chronic pain syndrome: Secondary | ICD-10-CM | POA: Diagnosis not present

## 2018-12-22 DIAGNOSIS — Z79899 Other long term (current) drug therapy: Secondary | ICD-10-CM | POA: Diagnosis not present

## 2019-01-20 DIAGNOSIS — Z760 Encounter for issue of repeat prescription: Secondary | ICD-10-CM | POA: Diagnosis not present

## 2019-01-20 DIAGNOSIS — Z79899 Other long term (current) drug therapy: Secondary | ICD-10-CM | POA: Diagnosis not present

## 2019-01-20 DIAGNOSIS — G894 Chronic pain syndrome: Secondary | ICD-10-CM | POA: Diagnosis not present

## 2019-01-20 DIAGNOSIS — M5136 Other intervertebral disc degeneration, lumbar region: Secondary | ICD-10-CM | POA: Diagnosis not present

## 2019-02-01 DIAGNOSIS — E782 Mixed hyperlipidemia: Secondary | ICD-10-CM | POA: Diagnosis not present

## 2019-02-01 DIAGNOSIS — I10 Essential (primary) hypertension: Secondary | ICD-10-CM | POA: Diagnosis not present

## 2019-02-01 DIAGNOSIS — E119 Type 2 diabetes mellitus without complications: Secondary | ICD-10-CM | POA: Diagnosis not present

## 2019-02-01 DIAGNOSIS — E1165 Type 2 diabetes mellitus with hyperglycemia: Secondary | ICD-10-CM | POA: Diagnosis not present

## 2019-02-01 DIAGNOSIS — E785 Hyperlipidemia, unspecified: Secondary | ICD-10-CM | POA: Diagnosis not present

## 2019-02-02 DIAGNOSIS — I129 Hypertensive chronic kidney disease with stage 1 through stage 4 chronic kidney disease, or unspecified chronic kidney disease: Secondary | ICD-10-CM | POA: Diagnosis not present

## 2019-02-02 DIAGNOSIS — G4700 Insomnia, unspecified: Secondary | ICD-10-CM | POA: Diagnosis not present

## 2019-02-02 DIAGNOSIS — N183 Chronic kidney disease, stage 3 (moderate): Secondary | ICD-10-CM | POA: Diagnosis not present

## 2019-02-02 DIAGNOSIS — E1122 Type 2 diabetes mellitus with diabetic chronic kidney disease: Secondary | ICD-10-CM | POA: Diagnosis not present

## 2019-02-02 DIAGNOSIS — E782 Mixed hyperlipidemia: Secondary | ICD-10-CM | POA: Diagnosis not present

## 2019-02-14 DIAGNOSIS — E782 Mixed hyperlipidemia: Secondary | ICD-10-CM | POA: Diagnosis not present

## 2019-02-14 DIAGNOSIS — I10 Essential (primary) hypertension: Secondary | ICD-10-CM | POA: Diagnosis not present

## 2019-02-14 DIAGNOSIS — E119 Type 2 diabetes mellitus without complications: Secondary | ICD-10-CM | POA: Diagnosis not present

## 2019-02-14 DIAGNOSIS — I6529 Occlusion and stenosis of unspecified carotid artery: Secondary | ICD-10-CM | POA: Diagnosis not present

## 2019-02-14 DIAGNOSIS — G4700 Insomnia, unspecified: Secondary | ICD-10-CM | POA: Diagnosis not present

## 2019-02-22 DIAGNOSIS — G894 Chronic pain syndrome: Secondary | ICD-10-CM | POA: Diagnosis not present

## 2019-02-22 DIAGNOSIS — M5136 Other intervertebral disc degeneration, lumbar region: Secondary | ICD-10-CM | POA: Diagnosis not present

## 2019-02-22 DIAGNOSIS — Z79899 Other long term (current) drug therapy: Secondary | ICD-10-CM | POA: Diagnosis not present

## 2019-02-28 DIAGNOSIS — I10 Essential (primary) hypertension: Secondary | ICD-10-CM | POA: Diagnosis not present

## 2019-02-28 DIAGNOSIS — G4700 Insomnia, unspecified: Secondary | ICD-10-CM | POA: Diagnosis not present

## 2019-02-28 DIAGNOSIS — I6529 Occlusion and stenosis of unspecified carotid artery: Secondary | ICD-10-CM | POA: Diagnosis not present

## 2019-02-28 DIAGNOSIS — E119 Type 2 diabetes mellitus without complications: Secondary | ICD-10-CM | POA: Diagnosis not present

## 2019-02-28 DIAGNOSIS — E782 Mixed hyperlipidemia: Secondary | ICD-10-CM | POA: Diagnosis not present

## 2019-03-23 DIAGNOSIS — M5136 Other intervertebral disc degeneration, lumbar region: Secondary | ICD-10-CM | POA: Diagnosis not present

## 2019-03-23 DIAGNOSIS — G894 Chronic pain syndrome: Secondary | ICD-10-CM | POA: Diagnosis not present

## 2019-03-23 DIAGNOSIS — Z79899 Other long term (current) drug therapy: Secondary | ICD-10-CM | POA: Diagnosis not present

## 2019-03-28 DIAGNOSIS — E119 Type 2 diabetes mellitus without complications: Secondary | ICD-10-CM | POA: Diagnosis not present

## 2019-03-28 DIAGNOSIS — I10 Essential (primary) hypertension: Secondary | ICD-10-CM | POA: Diagnosis not present

## 2019-03-28 DIAGNOSIS — E782 Mixed hyperlipidemia: Secondary | ICD-10-CM | POA: Diagnosis not present

## 2019-04-25 DIAGNOSIS — G894 Chronic pain syndrome: Secondary | ICD-10-CM | POA: Diagnosis not present

## 2019-04-25 DIAGNOSIS — M5136 Other intervertebral disc degeneration, lumbar region: Secondary | ICD-10-CM | POA: Diagnosis not present

## 2019-04-25 DIAGNOSIS — Z79899 Other long term (current) drug therapy: Secondary | ICD-10-CM | POA: Diagnosis not present

## 2019-05-18 DIAGNOSIS — E782 Mixed hyperlipidemia: Secondary | ICD-10-CM | POA: Diagnosis not present

## 2019-05-18 DIAGNOSIS — I10 Essential (primary) hypertension: Secondary | ICD-10-CM | POA: Diagnosis not present

## 2019-05-18 DIAGNOSIS — G4700 Insomnia, unspecified: Secondary | ICD-10-CM | POA: Diagnosis not present

## 2019-05-18 DIAGNOSIS — E119 Type 2 diabetes mellitus without complications: Secondary | ICD-10-CM | POA: Diagnosis not present

## 2019-05-23 DIAGNOSIS — G894 Chronic pain syndrome: Secondary | ICD-10-CM | POA: Diagnosis not present

## 2019-05-23 DIAGNOSIS — Z79899 Other long term (current) drug therapy: Secondary | ICD-10-CM | POA: Diagnosis not present

## 2019-05-23 DIAGNOSIS — M5136 Other intervertebral disc degeneration, lumbar region: Secondary | ICD-10-CM | POA: Diagnosis not present

## 2019-06-21 DIAGNOSIS — E559 Vitamin D deficiency, unspecified: Secondary | ICD-10-CM | POA: Diagnosis not present

## 2019-06-21 DIAGNOSIS — M5136 Other intervertebral disc degeneration, lumbar region: Secondary | ICD-10-CM | POA: Diagnosis not present

## 2019-06-21 DIAGNOSIS — G894 Chronic pain syndrome: Secondary | ICD-10-CM | POA: Diagnosis not present

## 2019-06-21 DIAGNOSIS — Z79899 Other long term (current) drug therapy: Secondary | ICD-10-CM | POA: Diagnosis not present

## 2019-07-22 DIAGNOSIS — G894 Chronic pain syndrome: Secondary | ICD-10-CM | POA: Diagnosis not present

## 2019-07-22 DIAGNOSIS — M5136 Other intervertebral disc degeneration, lumbar region: Secondary | ICD-10-CM | POA: Diagnosis not present

## 2019-07-22 DIAGNOSIS — Z79899 Other long term (current) drug therapy: Secondary | ICD-10-CM | POA: Diagnosis not present

## 2019-08-09 DIAGNOSIS — I10 Essential (primary) hypertension: Secondary | ICD-10-CM | POA: Diagnosis not present

## 2019-08-09 DIAGNOSIS — E119 Type 2 diabetes mellitus without complications: Secondary | ICD-10-CM | POA: Diagnosis not present

## 2019-08-09 DIAGNOSIS — G4700 Insomnia, unspecified: Secondary | ICD-10-CM | POA: Diagnosis not present

## 2019-08-09 DIAGNOSIS — I6529 Occlusion and stenosis of unspecified carotid artery: Secondary | ICD-10-CM | POA: Diagnosis not present

## 2019-08-09 DIAGNOSIS — E782 Mixed hyperlipidemia: Secondary | ICD-10-CM | POA: Diagnosis not present

## 2019-08-09 DIAGNOSIS — M545 Low back pain: Secondary | ICD-10-CM | POA: Diagnosis not present

## 2019-08-09 DIAGNOSIS — R944 Abnormal results of kidney function studies: Secondary | ICD-10-CM | POA: Diagnosis not present

## 2019-08-09 DIAGNOSIS — F17200 Nicotine dependence, unspecified, uncomplicated: Secondary | ICD-10-CM | POA: Diagnosis not present

## 2019-08-09 DIAGNOSIS — Z0000 Encounter for general adult medical examination without abnormal findings: Secondary | ICD-10-CM | POA: Diagnosis not present

## 2019-08-10 DIAGNOSIS — D631 Anemia in chronic kidney disease: Secondary | ICD-10-CM | POA: Diagnosis not present

## 2019-08-10 DIAGNOSIS — I10 Essential (primary) hypertension: Secondary | ICD-10-CM | POA: Diagnosis not present

## 2019-08-10 DIAGNOSIS — F17200 Nicotine dependence, unspecified, uncomplicated: Secondary | ICD-10-CM | POA: Diagnosis not present

## 2019-08-10 DIAGNOSIS — E119 Type 2 diabetes mellitus without complications: Secondary | ICD-10-CM | POA: Diagnosis not present

## 2019-08-10 DIAGNOSIS — G8929 Other chronic pain: Secondary | ICD-10-CM | POA: Diagnosis not present

## 2019-08-10 DIAGNOSIS — E785 Hyperlipidemia, unspecified: Secondary | ICD-10-CM | POA: Diagnosis not present

## 2019-08-10 DIAGNOSIS — M545 Low back pain: Secondary | ICD-10-CM | POA: Diagnosis not present

## 2019-08-10 DIAGNOSIS — F17218 Nicotine dependence, cigarettes, with other nicotine-induced disorders: Secondary | ICD-10-CM | POA: Diagnosis not present

## 2019-08-10 DIAGNOSIS — E782 Mixed hyperlipidemia: Secondary | ICD-10-CM | POA: Diagnosis not present

## 2019-08-10 DIAGNOSIS — E1122 Type 2 diabetes mellitus with diabetic chronic kidney disease: Secondary | ICD-10-CM | POA: Diagnosis not present

## 2019-08-10 DIAGNOSIS — I6529 Occlusion and stenosis of unspecified carotid artery: Secondary | ICD-10-CM | POA: Diagnosis not present

## 2019-08-10 DIAGNOSIS — G4700 Insomnia, unspecified: Secondary | ICD-10-CM | POA: Diagnosis not present

## 2019-08-10 DIAGNOSIS — Z8673 Personal history of transient ischemic attack (TIA), and cerebral infarction without residual deficits: Secondary | ICD-10-CM | POA: Diagnosis not present

## 2019-08-10 DIAGNOSIS — I129 Hypertensive chronic kidney disease with stage 1 through stage 4 chronic kidney disease, or unspecified chronic kidney disease: Secondary | ICD-10-CM | POA: Diagnosis not present

## 2019-08-10 DIAGNOSIS — E1165 Type 2 diabetes mellitus with hyperglycemia: Secondary | ICD-10-CM | POA: Diagnosis not present

## 2019-08-19 DIAGNOSIS — G894 Chronic pain syndrome: Secondary | ICD-10-CM | POA: Diagnosis not present

## 2019-08-19 DIAGNOSIS — F1120 Opioid dependence, uncomplicated: Secondary | ICD-10-CM | POA: Diagnosis not present

## 2019-08-19 DIAGNOSIS — M5136 Other intervertebral disc degeneration, lumbar region: Secondary | ICD-10-CM | POA: Diagnosis not present

## 2019-08-19 DIAGNOSIS — Z79899 Other long term (current) drug therapy: Secondary | ICD-10-CM | POA: Diagnosis not present

## 2019-08-24 DIAGNOSIS — N1831 Chronic kidney disease, stage 3a: Secondary | ICD-10-CM | POA: Diagnosis not present

## 2019-08-24 DIAGNOSIS — I129 Hypertensive chronic kidney disease with stage 1 through stage 4 chronic kidney disease, or unspecified chronic kidney disease: Secondary | ICD-10-CM | POA: Diagnosis not present

## 2019-08-24 DIAGNOSIS — H259 Unspecified age-related cataract: Secondary | ICD-10-CM | POA: Diagnosis not present

## 2019-09-02 DIAGNOSIS — I10 Essential (primary) hypertension: Secondary | ICD-10-CM | POA: Diagnosis not present

## 2019-09-02 DIAGNOSIS — E1165 Type 2 diabetes mellitus with hyperglycemia: Secondary | ICD-10-CM | POA: Diagnosis not present

## 2019-09-02 DIAGNOSIS — Z8673 Personal history of transient ischemic attack (TIA), and cerebral infarction without residual deficits: Secondary | ICD-10-CM | POA: Diagnosis not present

## 2019-09-02 DIAGNOSIS — E119 Type 2 diabetes mellitus without complications: Secondary | ICD-10-CM | POA: Diagnosis not present

## 2019-09-02 DIAGNOSIS — F17200 Nicotine dependence, unspecified, uncomplicated: Secondary | ICD-10-CM | POA: Diagnosis not present

## 2019-09-02 DIAGNOSIS — E782 Mixed hyperlipidemia: Secondary | ICD-10-CM | POA: Diagnosis not present

## 2019-09-02 DIAGNOSIS — E785 Hyperlipidemia, unspecified: Secondary | ICD-10-CM | POA: Diagnosis not present

## 2019-09-02 DIAGNOSIS — I6529 Occlusion and stenosis of unspecified carotid artery: Secondary | ICD-10-CM | POA: Diagnosis not present

## 2019-09-02 DIAGNOSIS — G4700 Insomnia, unspecified: Secondary | ICD-10-CM | POA: Diagnosis not present

## 2019-09-19 DIAGNOSIS — G894 Chronic pain syndrome: Secondary | ICD-10-CM | POA: Diagnosis not present

## 2019-09-19 DIAGNOSIS — Z79899 Other long term (current) drug therapy: Secondary | ICD-10-CM | POA: Diagnosis not present

## 2019-09-19 DIAGNOSIS — M5136 Other intervertebral disc degeneration, lumbar region: Secondary | ICD-10-CM | POA: Diagnosis not present

## 2019-10-06 DIAGNOSIS — Z961 Presence of intraocular lens: Secondary | ICD-10-CM | POA: Diagnosis not present

## 2019-10-06 DIAGNOSIS — E119 Type 2 diabetes mellitus without complications: Secondary | ICD-10-CM | POA: Diagnosis not present

## 2019-10-06 DIAGNOSIS — H25811 Combined forms of age-related cataract, right eye: Secondary | ICD-10-CM | POA: Diagnosis not present

## 2019-10-10 IMAGING — MR MR MRA HEAD W/O CM
10 of 11 series · 37 of 48 positions shown · non-contrast
Comparison: Head CT from yesterday.  Brain MRI 01/07/2017

CLINICAL DATA: Stroke follow-up.  Left-sided weakness for 2-3 days.

EXAM:
MRI HEAD WITHOUT CONTRAST
MRA HEAD WITHOUT CONTRAST
TECHNIQUE: Multiplanar, multiecho pulse sequences of the brain and surrounding
structures were obtained without intravenous contrast. Angiographic
images of the head were obtained using MRA technique without
contrast.

[Series 2: t1_fl2d_sag · sagittal · 5.0mm · 0.42mm/px · 1 of 20 slices shown]
[im 1/20]
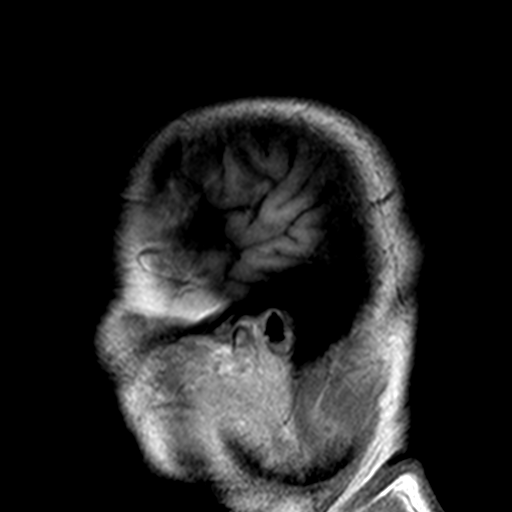

[Series 3: MRA · axial · 0.8mm · 0.38mm/px · 1 of 131 slices shown]
[im 1/131]
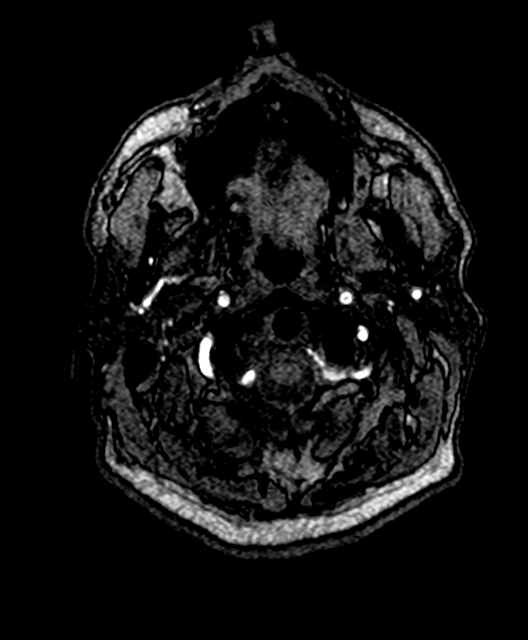

[Series 8: DWI · axial · 3.0mm · 0.72mm/px · z∈[-62,+100]mm · 9 of 110 slices shown (1 of 4)]
[im 1/110]
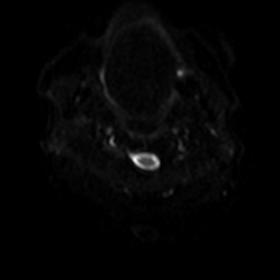
[im 14/110]
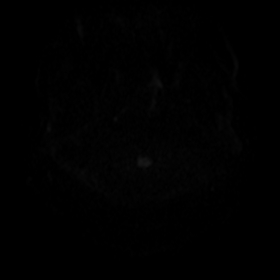
[im 28/110]
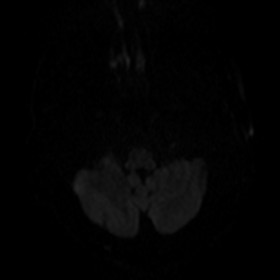
[im 41/110]
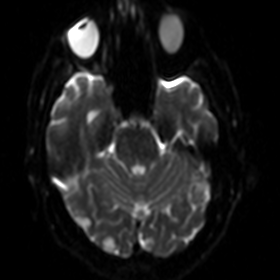
[im 55/110]
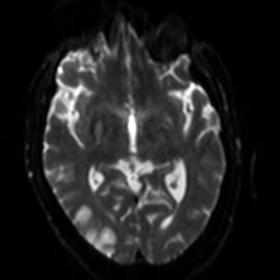
[im 69/110]
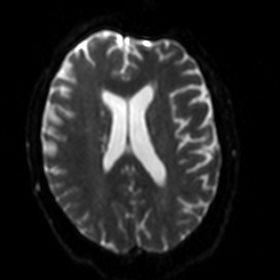
[im 82/110]
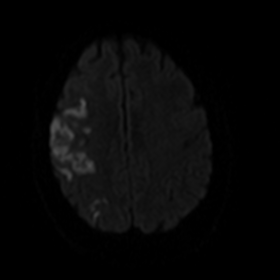
[im 96/110]
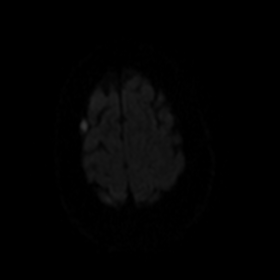
[im 110/110]
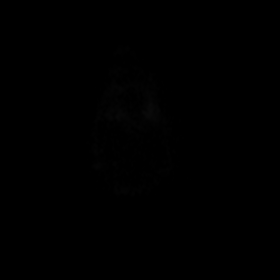

[Series 9: DWI · axial · 3.0mm · 0.77mm/px · z∈[-62,+99]mm · 5 of 55 slices shown (2 of 4)]
[im 1/55]
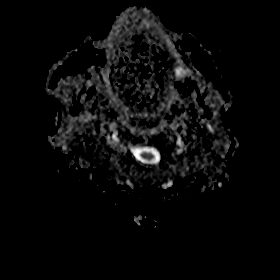
[im 14/55]
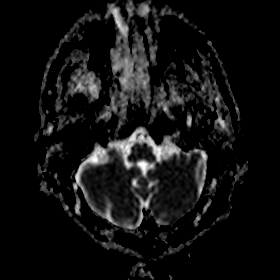
[im 28/55]
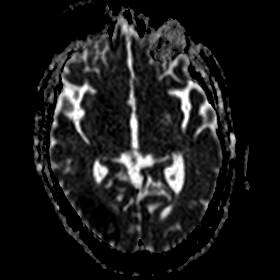
[im 41/55]
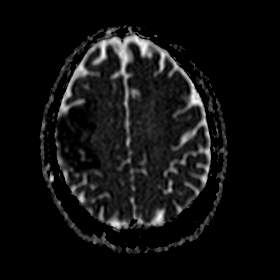
[im 55/55]
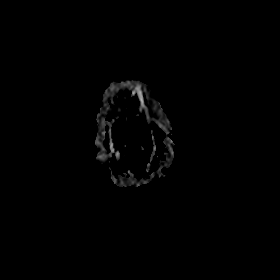

[Series 10: DWI · coronal · 5.0mm · 0.45mm/px · 3 of 34 slices shown (3 of 4)]
[im 1/34]
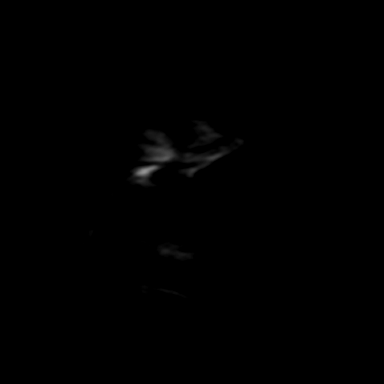
[im 17/34]
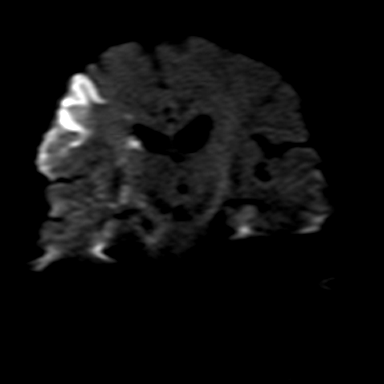
[im 34/34]
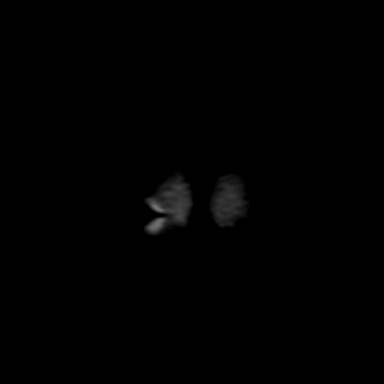

[Series 11: DWI · coronal · 5.0mm · 0.52mm/px · 3 of 34 slices shown (4 of 4)]
[im 1/34]
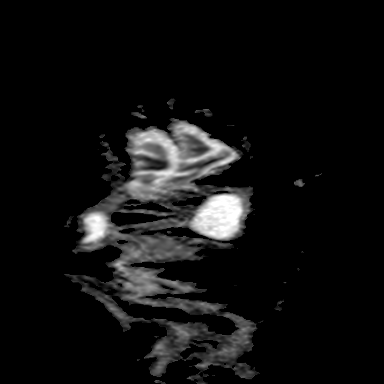
[im 17/34]
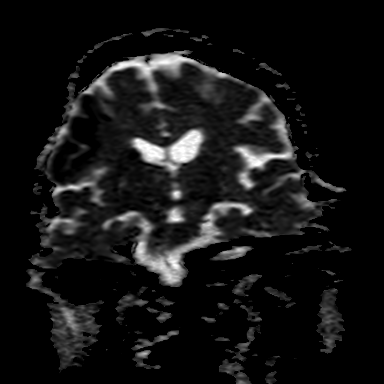
[im 34/34]
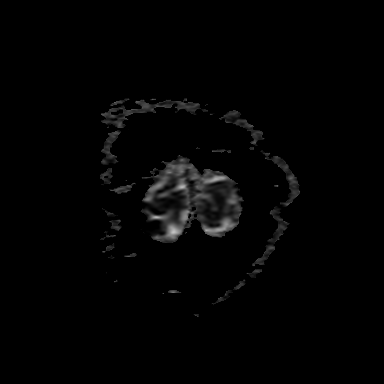

[Series 12: T2 · axial · 5.0mm · 0.67mm/px · z∈[-53,+89]mm · 2 of 23 slices shown (1 of 2)]
[im 1/23]
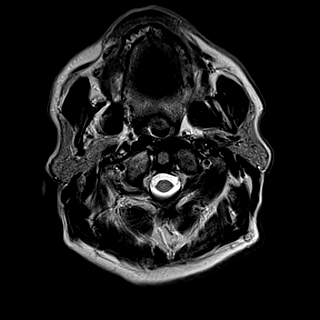
[im 23/23]
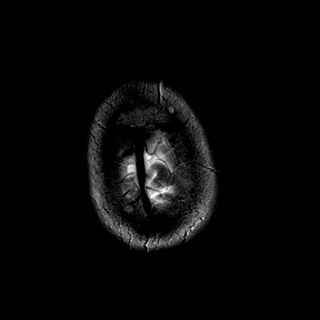

[Series 13: FLAIR · axial · 3.0mm · 0.81mm/px · z∈[-50,+87]mm · 4 of 47 slices shown]
[im 1/47]
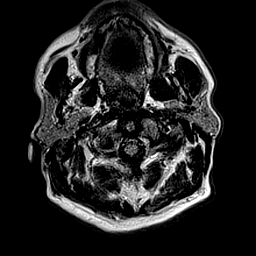
[im 16/47]
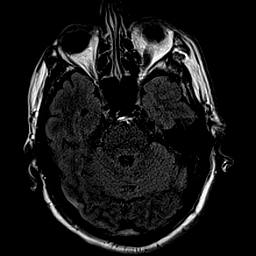
[im 31/47]
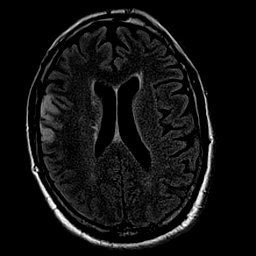
[im 47/47]
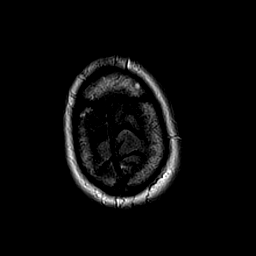

[Series 14: T1 · axial · 2.0mm · 0.42mm/px · z∈[-55,+100]mm · 7 of 79 slices shown]
[im 1/79]
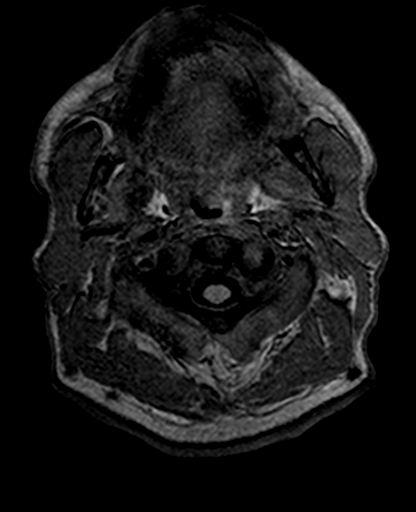
[im 14/79]
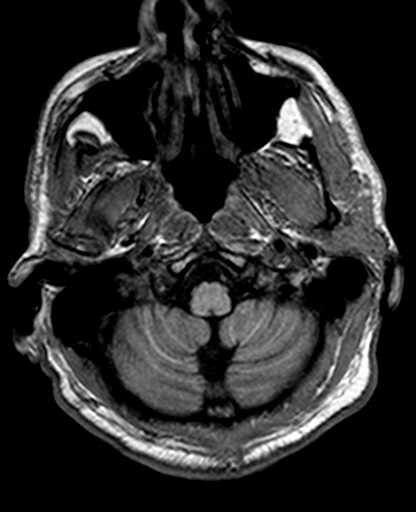
[im 27/79]
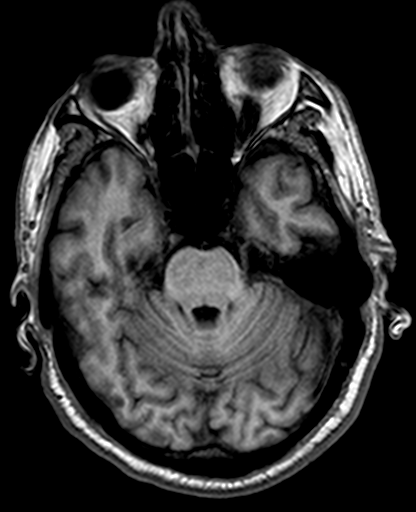
[im 40/79]
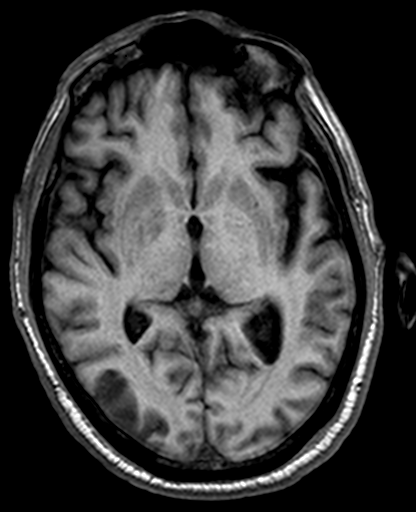
[im 53/79]
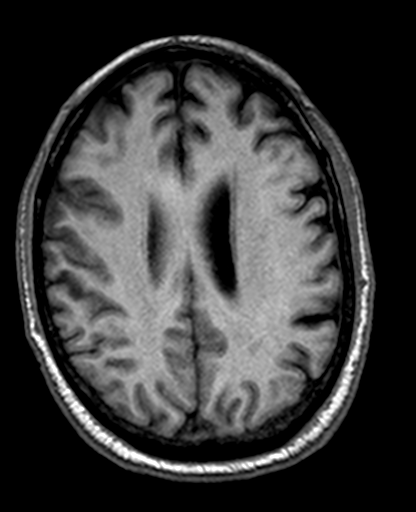
[im 66/79]
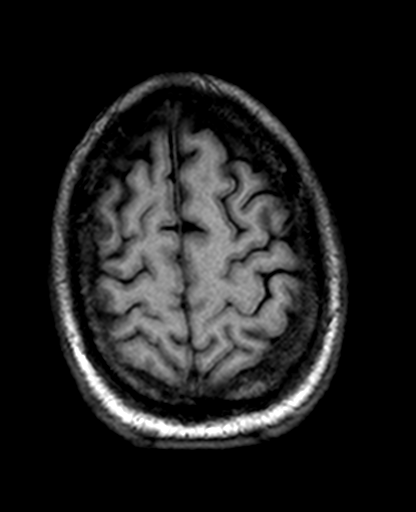
[im 79/79]
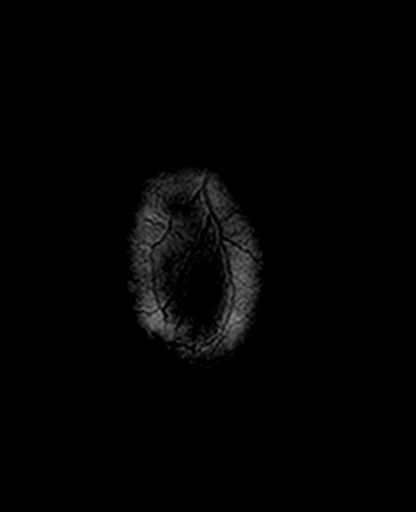

[Series 16: T2 · coronal · 5.0mm · 0.64mm/px · 2 of 28 slices shown (2 of 2)]
[im 1/28]
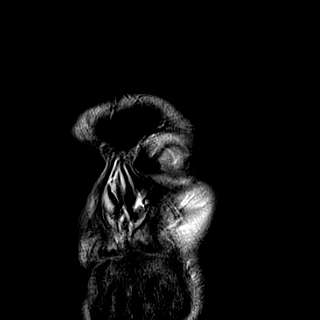
[im 28/28]
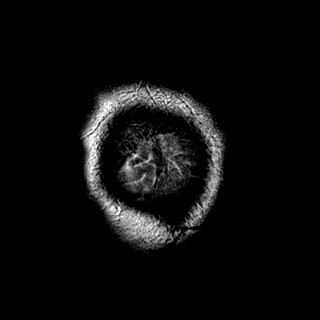

[37 of 48 positions shown; findings below may reference images not displayed]

FINDINGS: MRI HEAD FINDINGS

Brain: Multifocal moderate to large area of cortically based infarct
in the right posterior frontal and occipital parietal region. There
is also patchy acute infarction in the right putamen, internal
capsule, and caudate nucleus.

No hemorrhage, hydrocephalus, or masslike finding. Minor chronic
small vessel ischemic type change in the periventricular white
matter.

Punctate acute infarct in the right cerebellum.

Asymmetric increased DWI signal of the right hippocampus.

Vascular: Arterial findings below. Normal dural venous sinus flow
voids.

Skull and upper cervical spine: No evidence of marrow lesion.
Asymmetric right cervical facet arthropathy. C4-5 ACDF.

Sinuses/Orbits: Negative

MRA HEAD FINDINGS

Advanced intracranial atherosclerosis. No acute finding when
compared to prior.

Extensive atherosclerotic irregularity of the bilateral carotid
siphons with severe stenosis of the right mid cavernous segment
where there is flow gap. There is moderate left cavernous narrowing.
Moderate narrowing of the left V4 segment. Advanced narrowing of
left distal P2 and right proximal P3 segments. Moderate narrowing of
left M2 branches. Moderate narrowing of proximal right pica. Mild
narrowing of the mid basilar.
IMPRESSION: 1. Multifocal acute infarction along the right MCA and border zone
cortex. Multifocal acute right basal ganglia infarct. Infarcts in
this distribution were also seen 01/07/2017.
2. Small acute infarct in the right cerebellum.
3. Restricted diffusion in the right hippocampus, please correlate
for seizure activity.
4. Stable intracranial MRA with advanced atherosclerosis. Most
notable in this setting is a severe right ICA cavernous segment
stenosis with flow gap. Additional narrowings described above.

## 2019-10-10 IMAGING — DX DG CHEST 2V
2 series · 2 of 2 positions shown · non-contrast
Comparison: 01/07/2017 and prior chest radiographs

CLINICAL DATA: 67-year-old male with acute CVA.  Fall yesterday.

EXAM:
CHEST  2 VIEW

[chest pa]
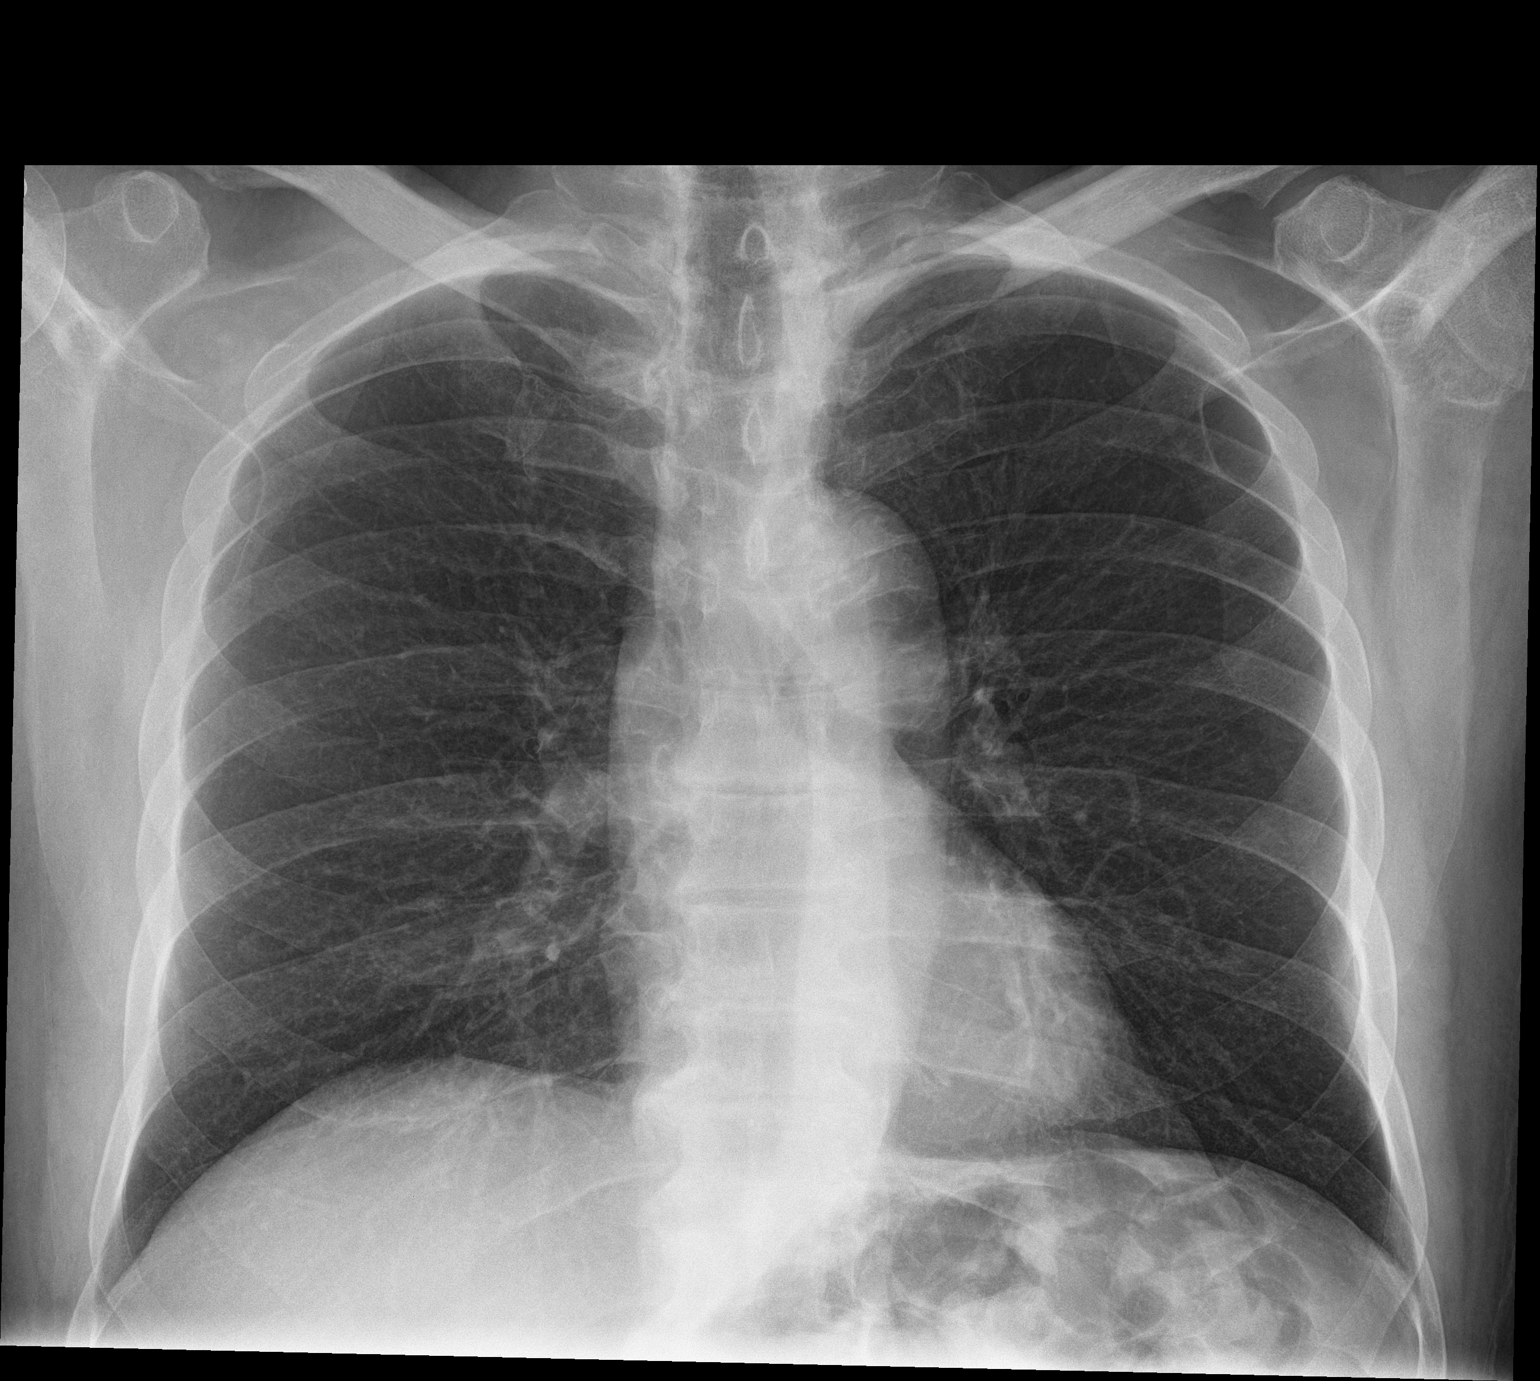

[chest lat]
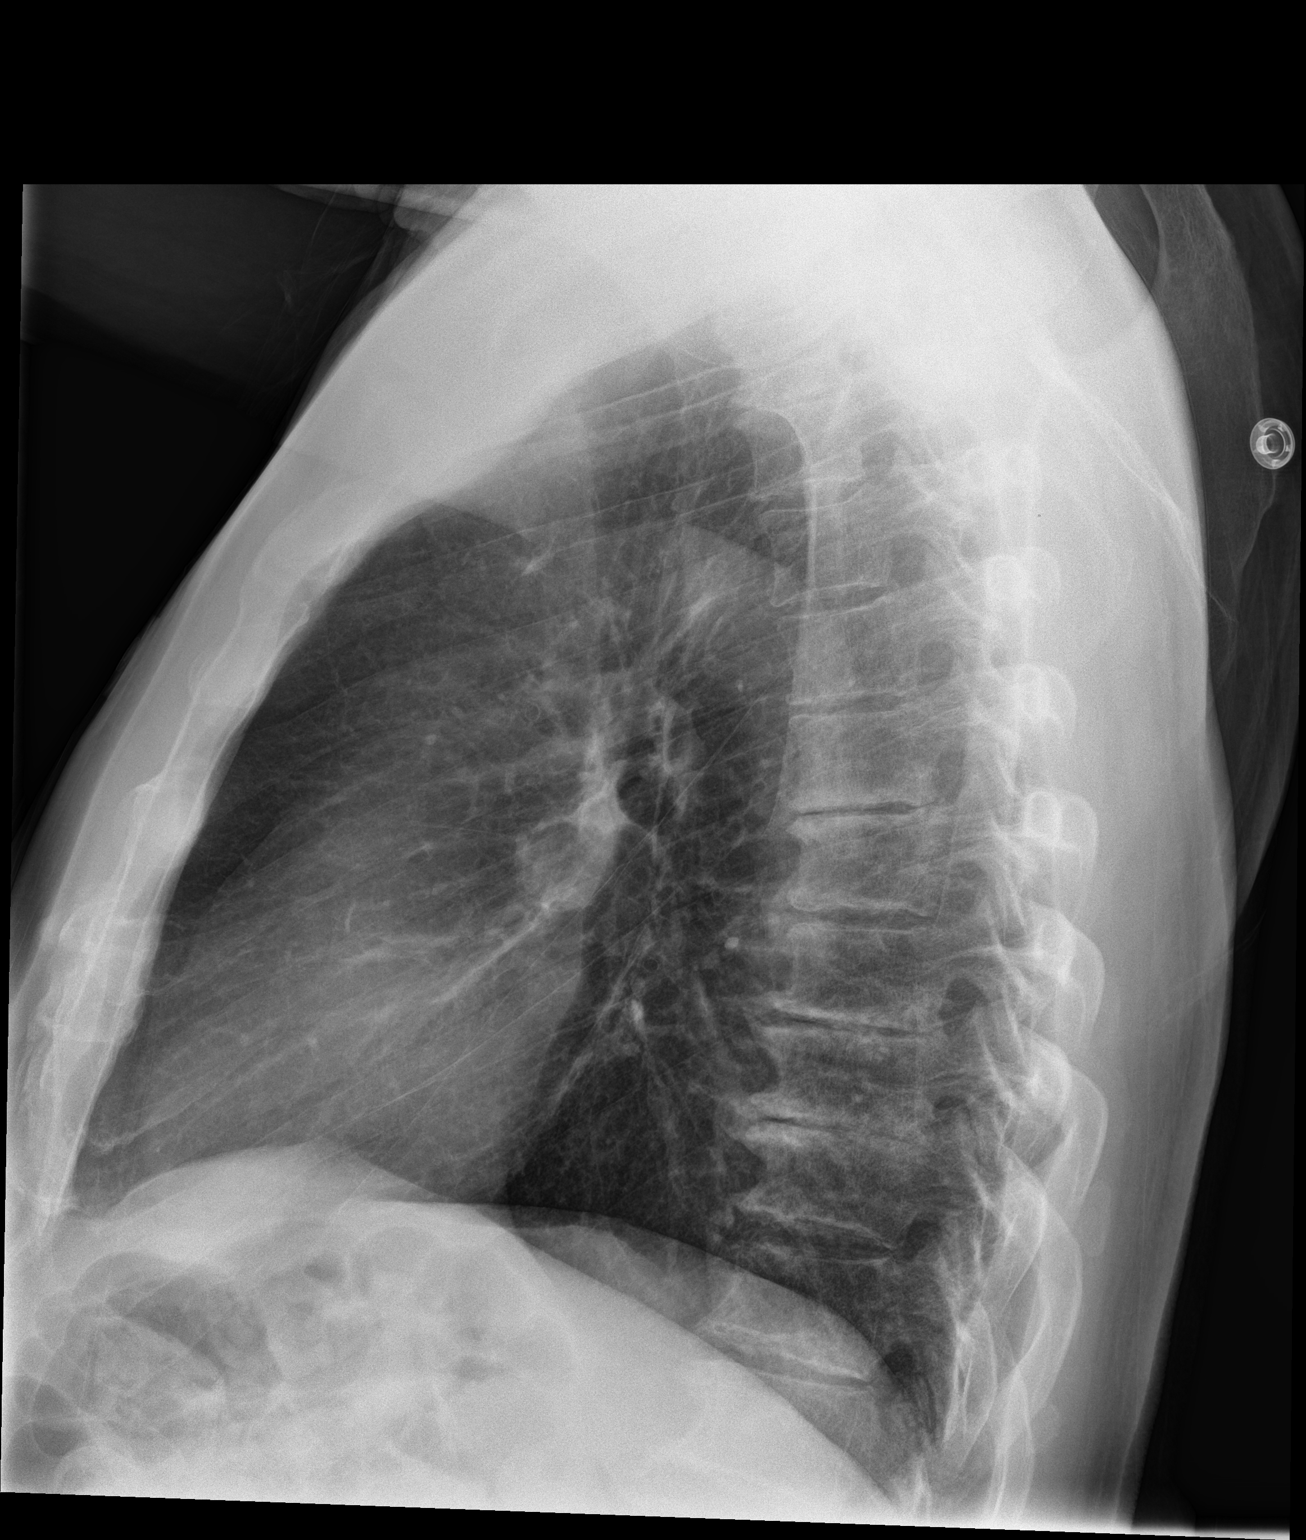

[2 of 2 positions shown; findings below may reference images not displayed]

FINDINGS: The cardiomediastinal silhouette is unremarkable.

There is no evidence of focal airspace disease, pulmonary edema,
suspicious pulmonary nodule/mass, pleural effusion, or pneumothorax.

No acute bony abnormalities are identified.

Cervical spine fusion hardware again noted.
IMPRESSION: No active cardiopulmonary disease.

## 2019-10-19 DIAGNOSIS — G894 Chronic pain syndrome: Secondary | ICD-10-CM | POA: Diagnosis not present

## 2019-10-19 DIAGNOSIS — E559 Vitamin D deficiency, unspecified: Secondary | ICD-10-CM | POA: Diagnosis not present

## 2019-10-19 DIAGNOSIS — M5136 Other intervertebral disc degeneration, lumbar region: Secondary | ICD-10-CM | POA: Diagnosis not present

## 2019-10-19 DIAGNOSIS — Z79899 Other long term (current) drug therapy: Secondary | ICD-10-CM | POA: Diagnosis not present

## 2019-11-14 DIAGNOSIS — H25811 Combined forms of age-related cataract, right eye: Secondary | ICD-10-CM | POA: Diagnosis not present

## 2019-11-14 NOTE — Patient Instructions (Addendum)
Your procedure is scheduled on:  [id number]               Report to [facility] at    6:30 AM.                Call this number if you have problems the morning of surgery: [phone]   Do not eat or drink :After Midnight.    Take these medicines the morning of surgery with A SIP OF WATER: oxycodone if needed               No diabetic medication am of surgery         Do not wear jewelry, make-up or nail polish.  Do not wear lotions, powders, or perfumes. You may wear deodorant.  Do not bring valuables to the hospital.  Contacts, dentures or bridgework may not be worn into surgery.  Patients discharged the day of surgery will not be allowed to drive home.  Name and phone number of your driver.                                                                                                                                       Cataract Surgery  A cataract is a clouding of the lens of the eye. When a lens becomes cloudy, vision is reduced based on the degree and nature of the clouding. Surgery may be needed to improve vision. Surgery removes the cloudy lens and usually replaces it with a substitute lens (intraocular lens, IOL). LET YOUR EYE DOCTOR KNOW ABOUT:  Allergies to food or medicine.   Medicines taken including herbs, eyedrops, over-the-counter medicines, and creams.   Use of steroids (by mouth or creams).   Previous problems with anesthetics or numbing medicine.   History of bleeding problems or blood clots.   Previous surgery.   Other health problems, including diabetes and kidney problems.   Possibility of pregnancy, if this applies.  RISKS AND COMPLICATIONS  Infection.   Inflammation of the eyeball (endophthalmitis) that can spread to both eyes (sympathetic ophthalmia).   Poor wound healing.   If an IOL is inserted, it can later fall out of proper position. This is very uncommon.   Clouding of the part of your eye that holds an IOL in place. This is called an  "after-cataract." These are uncommon, but easily treated.  BEFORE THE PROCEDURE  Do not eat or drink anything except small amounts of water for 8 to 12 before your surgery, or as directed by your caregiver.    Unless you are told otherwise, continue any eyedrops you have been prescribed.   Talk to your primary caregiver about all other medicines that you take (both prescription and non-prescription). In some cases, you may need to stop or change medicines near the time of your surgery. This is most important if you are taking blood-thinning medicine. Do not stop medicines unless  you are told to do so.   Arrange for someone to drive you to and from the procedure.   Do not put contact lenses in either eye on the day of your surgery.  PROCEDURE There is more than one method for safely removing a cataract. Your doctor can explain the differences and help determine which is best for you. Phacoemulsification surgery is the most common form of cataract surgery.  An injection is given behind the eye or eyedrops are given to make this a painless procedure.   A small cut (incision) is made on the edge of the clear, dome-shaped surface that covers the front of the eye (cornea).   A tiny probe is painlessly inserted into the eye. This device gives off ultrasound waves that soften and break up the cloudy center of the lens. This makes it easier for the cloudy lens to be removed by suction.   An IOL may be implanted.   The normal lens of the eye is covered by a clear capsule. Part of that capsule is intentionally left in the eye to support the IOL.   Your surgeon may or may not use stitches to close the incision.  There are other forms of cataract surgery that require a larger incision and stiches to close the eye. This approach is taken in cases where the doctor feels that the cataract cannot be easily removed using phacoemulsification. AFTER THE PROCEDURE  When an IOL is implanted, it does not need  care. It becomes a permanent part of your eye and cannot be seen or felt.   Your doctor will schedule follow-up exams to check on your progress.   Review your other medicines with your doctor to see which can be resumed after surgery.   Use eyedrops or take medicine as prescribed by your doctor.  Document Released: 05/01/2011 Document Reviewed: 04/28/2011 [facility] Patient Information 2012 [place].  .Cataract Surgery Care After Refer to this sheet in the next few weeks. These instructions provide you with information on caring for yourself after your procedure. Your caregiver may also give you more specific instructions. Your treatment has been planned according to current medical practices, but problems sometimes occur. Call your caregiver if you have any problems or questions after your procedure.  HOME CARE INSTRUCTIONS   Avoid strenuous activities as directed by your caregiver.   Ask your caregiver when you can resume driving.   Use eyedrops or other medicines to help healing and control pressure inside your eye as directed by your caregiver.   Only take over-the-counter or prescription medicines for pain, discomfort, or fever as directed by your caregiver.   Do not to touch or rub your eyes.   You may be instructed to use a protective shield during the first few days and nights after surgery. If not, wear sunglasses to protect your eyes. This is to protect the eye from pressure or from being accidentally bumped.   Keep the area around your eye clean and dry. Avoid swimming or allowing water to hit you directly in the face while showering. Keep soap and shampoo out of your eyes.   Do not bend or lift heavy objects. Bending increases pressure in the eye. You can walk, climb stairs, and do light household chores.   Do not put a contact lens into the eye that had surgery until your caregiver says it is okay to do so.   Ask your doctor when you can return to work. This will  depend  on the kind of work that you do. If you work in a dusty environment, you may be advised to wear protective eyewear for a period of time.   Ask your caregiver when it will be safe to engage in sexual activity.   Continue with your regular eye exams as directed by your caregiver.  What to expect:  It is normal to feel itching and mild discomfort for a few days after cataract surgery. Some fluid discharge is also common, and your eye may be sensitive to light and touch.   After 1 to 2 days, even moderate discomfort should disappear. In most cases, healing will take about 6 weeks.   If you received an intraocular lens (IOL), you may notice that colors are very bright or have a blue tinge. Also, if you have been in bright sunlight, everything may appear reddish for a few hours. If you see these color tinges, it is because your lens is clear and no longer cloudy. Within a few months after receiving an IOL, these extra colors should go away. When you have healed, you will probably need new glasses.  SEEK MEDICAL CARE IF:   You have increased bruising around your eye.   You have discomfort not helped by medicine.  SEEK IMMEDIATE MEDICAL CARE IF:   You have a  fever.   You have a worsening or sudden vision loss.   You have redness, swelling, or increasing pain in the eye.   You have a thick discharge from the eye that had surgery.  MAKE SURE YOU:  Understand these instructions.   Will watch your condition.   Will get help right away if you are not doing well or get worse.  Document Released: 11/29/2004 Document Revised: 05/01/2011 Document Reviewed: 01/03/2011 [facility] Patient Information 2012 [place].    Monitored Anesthesia Care  Monitored anesthesia care is an anesthesia service for a medical procedure. Anesthesia is the loss of the ability to feel pain. It is produced by medications called anesthetics. It may affect a small area of your body (local anesthesia), a large  area of your body (regional anesthesia), or your entire body (general anesthesia). The need for monitored anesthesia care depends your procedure, your condition, and the potential need for regional or general anesthesia. It is often provided during procedures where:   General anesthesia may be needed if there are complications. This is because you need special care when you are under general anesthesia.    You will be under local or regional anesthesia. This is so that you are able to have higher levels of anesthesia if needed.    You will receive calming medications (sedatives). This is especially the case if sedatives are given to put you in a semi-conscious state of relaxation (deep sedation). This is because the amount of sedative needed to produce this state can be hard to predict. Too much of a sedative can produce general anesthesia. Monitored anesthesia care is performed by one or more caregivers who have special training in all types of anesthesia. You will need to meet with these caregivers before your procedure. During this meeting, they will ask you about your medical history. They will also give you instructions to follow. (For example, you will need to stop eating and drinking before your procedure. You may also need to stop or change medications you are taking.) During your procedure, your caregivers will stay with you. They will:   Watch your condition. This includes watching you blood pressure, breathing,  and level of pain.    Diagnose and treat problems that occur.    Give medications if they are needed. These may include calming medications (sedatives) and anesthetics.    Make sure you are comfortable.   Having monitored anesthesia care does not necessarily mean that you will be under anesthesia. It does mean that your caregivers will be able to manage anesthesia if you need it or if it occurs. It also means that you will be able to have a different type of anesthesia than you are  having if you need it. When your procedure is complete, your caregivers will continue to watch your condition. They will make sure any medications wear off before you are allowed to go home.  Document Released: 02/05/2005 Document Revised: 09/06/2012 Document Reviewed: 06/23/2012 [facility] Patient Information 2014 [place], [place].

## 2019-11-15 NOTE — H&P (Signed)
Surgical History & Physical  Patient Name: Ryan Butler DOB: 1950/07/16  Surgery: Cataract extraction with intraocular lens implant phacoemulsification; Right Eye  Surgeon: Baruch Goldmann MD Surgery Date:  11/21/2019 Pre-Op Date:  11/14/2019  HPI: A 30 Yr. old male patient Patient is here for a cataract evaluation of the right eye. He is s/p PC IOL OS in 2019. He complains of blurry vision in the right eye at all distances. He has a hard time driving with both eyes open and often has to close his right eye in order to see. He also has trouble with glare from oncoming headlights and bright lights. This is negatively affecting the patient's quality of life. He is diabetic and reports his BS was 98 this morning. He uses PF ATs at home to help with dryness. HPI Completed by Dr. Baruch Goldmann  Medical History: PC IOL OS (2019) Cataracts OD H/O infection due to splinter RLL H/O splinter in OD Arthritis Diabetes - DM Type 2 GERD Ringing in ears High Blood Pressure LDL Stroke  Review of Systems Ear, Nose, Mouth & Throat Ringing in ears Musculoskeletal Joint Ache, Stiffness All recorded systems are negative except as noted above.  Social   Current every day smoker   Medication Artifical Tear PF,  Metformin, Oxycodone, Losartan Potassium, Clopidogrel, Amitriptyline, Atorvastatin, Baclofen, Hydrochlorothiazide,   Sx/Procedures Endarterctomy, Neck, Back Surgery, Leg Surgery,   Drug Allergies  Hydrocodone, Neurontin,   History & Physical: Heent:  Cataract, Right eye NECK: supple without bruits LUNGS: lungs clear to auscultation CV: regular rate and rhythm Abdomen: soft and non-tender  Impression & Plan: Assessment: 1.  COMBINED FORMS AGE RELATED CATARACT; Right Eye (H25.811) 2.  Pseudophakia- post-op ; Left Eye (Z96.1) 3.  Diabetes Type 2 No retinopathy (E11.9)  Plan: 1.  Cataract accounts for the patient's decreased vision. This visual impairment is not correctable with a  tolerable change in glasses or contact lenses. Cataract surgery with an implantation of a new lens should significantly improve the visual and functional status of the patient. Discussed all risks, benefits, alternatives, and potential complications. Discussed the procedures and recovery. Patient desires to have surgery. A-scan ordered and performed today for intra-ocular lens calculations. The surgery will be performed in order to improve vision for driving, reading, and for eye examinations. Recommend phacoemulsification with intra-ocular lens. Right Eye. Surgery required to correct imbalance of vision. Dilates poorly - shugacaine by protocol. Malyugin Ring in room. Omidira. 2.  Good outcome - check previous IOL calcs with reference to OD. 3.  Stressed importance of blood sugar and blood pressure control, and also yearly eye examinations. Discussed the need for ongoing proactive ocular exams and treatment, hopefully before visual symptoms develop.

## 2019-11-17 DIAGNOSIS — M545 Low back pain: Secondary | ICD-10-CM | POA: Diagnosis not present

## 2019-11-17 DIAGNOSIS — Z79899 Other long term (current) drug therapy: Secondary | ICD-10-CM | POA: Diagnosis not present

## 2019-11-17 DIAGNOSIS — E559 Vitamin D deficiency, unspecified: Secondary | ICD-10-CM | POA: Diagnosis not present

## 2019-11-17 DIAGNOSIS — G8929 Other chronic pain: Secondary | ICD-10-CM | POA: Diagnosis not present

## 2019-11-17 DIAGNOSIS — G894 Chronic pain syndrome: Secondary | ICD-10-CM | POA: Diagnosis not present

## 2019-11-18 ENCOUNTER — Other Ambulatory Visit: Payer: Self-pay

## 2019-11-18 ENCOUNTER — Other Ambulatory Visit (HOSPITAL_COMMUNITY)
Admission: RE | Admit: 2019-11-18 | Discharge: 2019-11-18 | Disposition: A | Discharge status: Home / Self Care | Payer: Medicare HMO | Source: Ambulatory Visit | Attending: Ophthalmology | Admitting: Ophthalmology

## 2019-11-18 ENCOUNTER — Encounter (HOSPITAL_COMMUNITY)
Admission: RE | Admit: 2019-11-18 | Discharge: 2019-11-18 | Disposition: A | Discharge status: Home / Self Care | Payer: Medicare HMO | Source: Ambulatory Visit | Attending: Ophthalmology | Admitting: Ophthalmology

## 2019-11-18 DIAGNOSIS — Z01812 Encounter for preprocedural laboratory examination: Secondary | ICD-10-CM | POA: Insufficient documentation

## 2019-11-18 DIAGNOSIS — Z20822 Contact with and (suspected) exposure to covid-19: Secondary | ICD-10-CM | POA: Diagnosis not present

## 2019-11-18 LAB — SARS CORONAVIRUS 2 (TAT 6-24 HRS): SARS Coronavirus 2: NEGATIVE

## 2019-11-21 ENCOUNTER — Ambulatory Visit (HOSPITAL_COMMUNITY): Payer: Medicare HMO | Admitting: Anesthesiology

## 2019-11-21 ENCOUNTER — Encounter (HOSPITAL_COMMUNITY)
Admission: RE | Disposition: A | Discharge status: Home / Self Care | Payer: Self-pay | Source: Home / Self Care | Attending: Ophthalmology

## 2019-11-21 ENCOUNTER — Ambulatory Visit (HOSPITAL_COMMUNITY)
Admission: RE | Admit: 2019-11-21 | Discharge: 2019-11-21 | Disposition: A | Discharge status: Home / Self Care | Payer: Medicare HMO | Attending: Ophthalmology | Admitting: Ophthalmology

## 2019-11-21 ENCOUNTER — Encounter (HOSPITAL_COMMUNITY): Payer: Self-pay | Admitting: Ophthalmology

## 2019-11-21 DIAGNOSIS — H2181 Floppy iris syndrome: Secondary | ICD-10-CM | POA: Diagnosis not present

## 2019-11-21 DIAGNOSIS — E1136 Type 2 diabetes mellitus with diabetic cataract: Secondary | ICD-10-CM | POA: Insufficient documentation

## 2019-11-21 DIAGNOSIS — E1151 Type 2 diabetes mellitus with diabetic peripheral angiopathy without gangrene: Secondary | ICD-10-CM | POA: Insufficient documentation

## 2019-11-21 DIAGNOSIS — F17200 Nicotine dependence, unspecified, uncomplicated: Secondary | ICD-10-CM | POA: Insufficient documentation

## 2019-11-21 DIAGNOSIS — Z7984 Long term (current) use of oral hypoglycemic drugs: Secondary | ICD-10-CM | POA: Insufficient documentation

## 2019-11-21 DIAGNOSIS — Z79899 Other long term (current) drug therapy: Secondary | ICD-10-CM | POA: Diagnosis not present

## 2019-11-21 DIAGNOSIS — Z8673 Personal history of transient ischemic attack (TIA), and cerebral infarction without residual deficits: Secondary | ICD-10-CM | POA: Insufficient documentation

## 2019-11-21 DIAGNOSIS — Z961 Presence of intraocular lens: Secondary | ICD-10-CM | POA: Insufficient documentation

## 2019-11-21 DIAGNOSIS — E119 Type 2 diabetes mellitus without complications: Secondary | ICD-10-CM | POA: Diagnosis not present

## 2019-11-21 DIAGNOSIS — I10 Essential (primary) hypertension: Secondary | ICD-10-CM | POA: Diagnosis not present

## 2019-11-21 DIAGNOSIS — H25811 Combined forms of age-related cataract, right eye: Secondary | ICD-10-CM | POA: Insufficient documentation

## 2019-11-21 HISTORY — PX: CATARACT EXTRACTION W/PHACO: SHX586

## 2019-11-21 LAB — GLUCOSE, CAPILLARY: Glucose-Capillary: 111 mg/dL — ABNORMAL HIGH (ref 70–99)

## 2019-11-21 SURGERY — PHACOEMULSIFICATION, CATARACT, WITH IOL INSERTION
Anesthesia: Monitor Anesthesia Care | Site: Eye | Laterality: Right

## 2019-11-21 MED ORDER — POVIDONE-IODINE 5 % OP SOLN
OPHTHALMIC | Status: DC | PRN
  Administered 2019-11-21: 1 via OPHTHALMIC

## 2019-11-21 MED ORDER — NEOMYCIN-POLYMYXIN-DEXAMETH 3.5-10000-0.1 OP SUSP
OPHTHALMIC | Status: DC | PRN
  Administered 2019-11-21: 1 [drp] via OPHTHALMIC

## 2019-11-21 MED ORDER — LACTATED RINGERS IV SOLN: INTRAVENOUS | Status: DC

## 2019-11-21 MED ORDER — EPINEPHRINE PF 1 MG/ML IJ SOLN
INTRAMUSCULAR | Status: AC
  Filled 2019-11-21: qty 2

## 2019-11-21 MED ORDER — MIDAZOLAM HCL 2 MG/2ML IJ SOLN
INTRAMUSCULAR | Status: DC | PRN
  Administered 2019-11-21: 1 mg via INTRAVENOUS

## 2019-11-21 MED ORDER — CYCLOPENTOLATE-PHENYLEPHRINE 0.2-1 % OP SOLN
1.0000 [drp] | OPHTHALMIC | Status: AC | PRN
  Administered 2019-11-21 (×3): 1 [drp] via OPHTHALMIC

## 2019-11-21 MED ORDER — LIDOCAINE HCL 3.5 % OP GEL
1.0000 "application " | Freq: Once | OPHTHALMIC | Status: AC
  Administered 2019-11-21: 1 via OPHTHALMIC

## 2019-11-21 MED ORDER — EPINEPHRINE PF 1 MG/ML IJ SOLN
INTRAOCULAR | Status: DC | PRN
  Administered 2019-11-21: 500 mL

## 2019-11-21 MED ORDER — PHENYLEPHRINE HCL 2.5 % OP SOLN
1.0000 [drp] | OPHTHALMIC | Status: AC | PRN
  Administered 2019-11-21 (×3): 1 [drp] via OPHTHALMIC

## 2019-11-21 MED ORDER — LIDOCAINE HCL (PF) 1 % IJ SOLN
INTRAOCULAR | Status: DC | PRN
  Administered 2019-11-21: 1 mL via OPHTHALMIC

## 2019-11-21 MED ORDER — PROVISC 10 MG/ML IO SOLN
INTRAOCULAR | Status: DC | PRN
  Administered 2019-11-21: 0.85 mL via INTRAOCULAR

## 2019-11-21 MED ORDER — BSS IO SOLN
INTRAOCULAR | Status: DC | PRN
  Administered 2019-11-21: 15 mL via INTRAOCULAR

## 2019-11-21 MED ORDER — MIDAZOLAM HCL 2 MG/2ML IJ SOLN
INTRAMUSCULAR | Status: AC
  Filled 2019-11-21: qty 2

## 2019-11-21 MED ORDER — TETRACAINE HCL 0.5 % OP SOLN
1.0000 [drp] | OPHTHALMIC | Status: AC | PRN
  Administered 2019-11-21 (×3): 1 [drp] via OPHTHALMIC

## 2019-11-21 MED ORDER — SODIUM HYALURONATE 23 MG/ML IO SOLN
INTRAOCULAR | Status: DC | PRN
  Administered 2019-11-21: 0.6 mL via INTRAOCULAR

## 2019-11-21 SURGICAL SUPPLY — 14 items
CLOTH BEACON ORANGE TIMEOUT ST (SAFETY) ×1 IMPLANT
EYE SHIELD UNIVERSAL CLEAR (GAUZE/BANDAGES/DRESSINGS) ×1 IMPLANT
GLOVE BIOGEL PI IND STRL 7.0 (GLOVE) IMPLANT
GLOVE BIOGEL PI INDICATOR 7.0 (GLOVE) ×2
LENS ALC ACRYL/TECN (Ophthalmic Related) ×1 IMPLANT
NDL HYPO 18GX1.5 BLUNT FILL (NEEDLE) IMPLANT
NEEDLE HYPO 18GX1.5 BLUNT FILL (NEEDLE) ×2 IMPLANT
PAD ARMBOARD 7.5X6 YLW CONV (MISCELLANEOUS) ×1 IMPLANT
RING MALYGIN 7.0 (MISCELLANEOUS) ×1 IMPLANT
SYR TB 1ML LL NO SAFETY (SYRINGE) ×1 IMPLANT
TAPE SURG TRANSPORE 1 IN (GAUZE/BANDAGES/DRESSINGS) IMPLANT
TAPE SURGICAL TRANSPORE 1 IN (GAUZE/BANDAGES/DRESSINGS) ×2
VISCOELASTIC ADDITIONAL (OPHTHALMIC RELATED) ×1 IMPLANT
WATER STERILE IRR 250ML POUR (IV SOLUTION) ×1 IMPLANT

## 2019-11-21 NOTE — Transfer of Care (Signed)
Immediate Anesthesia Transfer of Care Note  Patient: Ryan Butler  Procedure(s) Performed: CATARACT EXTRACTION PHACO AND INTRAOCULAR LENS PLACEMENT RIGHT EYE (Right Eye)  Patient Location: Short Stay  Anesthesia Type:MAC  Level of Consciousness: awake, alert , oriented and patient cooperative  Airway & Oxygen Therapy: Patient Spontanous Breathing  Post-op Assessment: Report given to RN and Post -op Vital signs reviewed and stable  Post vital signs: Reviewed and stable  Last Vitals:  Vitals Value Taken Time  BP    Temp    Pulse    Resp    SpO2      Last Pain:  Vitals:   11/21/19 0658  TempSrc: Oral  PainSc: 0-No pain      Patients Stated Pain Goal: 8 (02/40/97 3532)  Complications: No complications documented.

## 2019-11-21 NOTE — Anesthesia Postprocedure Evaluation (Signed)
Anesthesia Post Note  Patient: Ryan Butler  Procedure(s) Performed: CATARACT EXTRACTION PHACO AND INTRAOCULAR LENS PLACEMENT RIGHT EYE (Right Eye)  Patient location during evaluation: Short Stay Anesthesia Type: MAC Level of consciousness: awake and alert Pain management: pain level controlled Vital Signs Assessment: post-procedure vital signs reviewed and stable Respiratory status: spontaneous breathing Cardiovascular status: stable Postop Assessment: no apparent nausea or vomiting Anesthetic complications: no   No complications documented.   Last Vitals:  Vitals:   11/21/19 0658 11/21/19 0825  BP: 121/61 (!) 141/68  Pulse:  72  Resp: (!) 22 18  Temp: 36.5 C 36.6 C  SpO2: 100% 98%    Last Pain:  Vitals:   11/21/19 0825  TempSrc: Oral  PainSc: 0-No pain                 Everette Rank

## 2019-11-21 NOTE — Anesthesia Preprocedure Evaluation (Signed)
Anesthesia Evaluation  Patient identified by MRN, date of birth, ID band Patient awake    Reviewed: Allergy & Precautions, H&P , NPO status , Patient's Chart, lab work & pertinent test results, reviewed documented beta blocker date and time   Airway Mallampati: II  TM Distance: >3 FB Neck ROM: full    Dental no notable dental hx.    Pulmonary neg pulmonary ROS, Current Smoker,    Pulmonary exam normal breath sounds clear to auscultation       Cardiovascular Exercise Tolerance: Good hypertension, + Peripheral Vascular Disease   Rhythm:regular Rate:Normal     Neuro/Psych  Headaches, CVA, Residual Symptoms negative psych ROS   GI/Hepatic negative GI ROS, Neg liver ROS,   Endo/Other  negative endocrine ROSdiabetes  Renal/GU negative Renal ROS  negative genitourinary   Musculoskeletal   Abdominal   Peds  Hematology negative hematology ROS (+)   Anesthesia Other Findings   Reproductive/Obstetrics negative OB ROS                             Anesthesia Physical Anesthesia Plan  ASA: III  Anesthesia Plan: MAC   Post-op Pain Management:    Induction:   PONV Risk Score and Plan:   Airway Management Planned:   Additional Equipment:   Intra-op Plan:   Post-operative Plan:   Informed Consent: I have reviewed the patients History and Physical, chart, labs and discussed the procedure including the risks, benefits and alternatives for the proposed anesthesia with the patient or authorized representative who has indicated his/her understanding and acceptance.     Dental Advisory Given  Plan Discussed with: CRNA  Anesthesia Plan Comments:         Anesthesia Quick Evaluation

## 2019-11-21 NOTE — Interval H&P Note (Signed)
History and Physical Interval Note:  11/21/2019 7:55 AM  Ryan Butler  has presented today for surgery, with the diagnosis of Nuclear sclerotic cataract - Right eye.  The various methods of treatment have been discussed with the patient and family. After consideration of risks, benefits and other options for treatment, the patient has consented to  Procedure(s) with comments: CATARACT EXTRACTION PHACO AND INTRAOCULAR LENS PLACEMENT RIGHT EYE (Right) - right as a surgical intervention.  The patient's history has been reviewed, patient examined, no change in status, stable for surgery.  I have reviewed the patient's chart and labs.  Questions were answered to the patient's satisfaction.     Baruch Goldmann

## 2019-11-21 NOTE — Discharge Instructions (Addendum)
PATIENT INSTRUCTIONS POST-ANESTHESIA  IMMEDIATELY FOLLOWING SURGERY:  Do not drive or operate machinery for the first twenty four hours after surgery.  Do not make any important decisions for twenty four hours after surgery or while taking narcotic pain medications or sedatives.  If you develop intractable nausea and vomiting or a severe headache please notify your doctor immediately.  FOLLOW-UP:  Please make an appointment with your surgeon as instructed. You do not need to follow up with anesthesia unless specifically instructed to do so.  WOUND CARE INSTRUCTIONS (if applicable):  Keep a dry clean dressing on the anesthesia/puncture wound site if there is drainage.  Once the wound has quit draining you may leave it open to air.  Generally you should leave the bandage intact for twenty four hours unless there is drainage.  If the epidural site drains for more than 36-48 hours please call the anesthesia department.  QUESTIONS?:  Please feel free to call your physician or the hospital operator if you have any questions, and they will be happy to assist you.      Please discharge patient when stable, will follow up today with Dr. Marisa Hua at the [facility] office immediately following discharge.  Leave shield in place until visit.  All paperwork with discharge instructions will be given at the office.  [facility] Address:  [address]  [place], [place] [address]

## 2019-11-21 NOTE — Op Note (Signed)
Date of procedure: 11/21/19  Pre-operative diagnosis: Visually significant combined-form age-related cataract, Right Eye; Poor Dilation, Right Eye (H25.811; H21.81)  Post-operative diagnosis: Visually significant cataract, Right Eye; Intra-operative Floppy Iris Syndrome, Right Eye  Procedure: Removal of cataract via phacoemulsification and insertion of intra-ocular lens Johnson and McGrath  +19.5D into the capsular bag of the Right Eye (CPT [phone])  Attending surgeon: Gerda Diss. Deantae Shackleton, MD, MA  Anesthesia: MAC, Topical Akten  Complications: None  Estimated Blood Loss: <44m (minimal)  Specimens: None  Implants: As above  Indications:  Visually significant cataract, Right Eye  Procedure:  The patient was seen and identified in the pre-operative area. The operative eye was identified and dilated.  The operative eye was marked.  Topical anesthesia was administered to the operative eye.     The patient was then to the operative suite and placed in the supine position.  A timeout was performed confirming the patient, procedure to be performed, and all other relevant information.   The patient's face was prepped and draped in the usual fashion for intra-ocular surgery.  A lid speculum was placed into the operative eye and the surgical microscope moved into place and focused.  Poor dilation of the iris was confirmed.  A superotemporal paracentesis was created using a 20 gauge paracentesis blade.  Shugarcaine was injected into the anterior chamber.  Viscoelastic was injected into the anterior chamber.  A temporal clear-corneal main wound incision was created using a 2.469mmicrokeratome.  A Malyugin ring was placed.  A continuous curvilinear capsulorrhexis was initiated using an irrigating cystitome and completed using capsulorrhexis forceps.  Hydrodissection and hydrodeliniation were performed.  Viscoelastic was injected into the anterior chamber.  A phacoemulsification handpiece and a  chopper as a second instrument were used to remove the nucleus and epinucleus. The irrigation/aspiration handpiece was used to remove any remaining cortical material.   The capsular bag was reinflated with viscoelastic, checked, and found to be intact.  The intraocular lens was inserted into the capsular bag and dialed into place using a Kuglen hook.  The Malyugin ring was removed.  The irrigation/aspiration handpiece was used to remove any remaining viscoelastic.  The clear corneal wound and paracentesis wounds were then hydrated and checked with Weck-Cels to be watertight.  The lid-speculum and drape was removed, and the patient's face was cleaned with a wet and dry 4x4.  Maxitrol was instilled in the eye before a clear shield was taped over the eye. The patient was taken to the post-operative care unit in good condition, having tolerated the procedure well.  Post-Op Instructions: [facility] and will receive all other orders and instructions.

## 2019-11-22 ENCOUNTER — Encounter (HOSPITAL_COMMUNITY): Payer: Self-pay | Admitting: Ophthalmology

## 2019-12-05 DIAGNOSIS — I10 Essential (primary) hypertension: Secondary | ICD-10-CM | POA: Diagnosis not present

## 2019-12-05 DIAGNOSIS — E785 Hyperlipidemia, unspecified: Secondary | ICD-10-CM | POA: Diagnosis not present

## 2019-12-05 DIAGNOSIS — E782 Mixed hyperlipidemia: Secondary | ICD-10-CM | POA: Diagnosis not present

## 2019-12-05 DIAGNOSIS — G4700 Insomnia, unspecified: Secondary | ICD-10-CM | POA: Diagnosis not present

## 2019-12-05 DIAGNOSIS — Z8673 Personal history of transient ischemic attack (TIA), and cerebral infarction without residual deficits: Secondary | ICD-10-CM | POA: Diagnosis not present

## 2019-12-05 DIAGNOSIS — E119 Type 2 diabetes mellitus without complications: Secondary | ICD-10-CM | POA: Diagnosis not present

## 2019-12-05 DIAGNOSIS — E1165 Type 2 diabetes mellitus with hyperglycemia: Secondary | ICD-10-CM | POA: Diagnosis not present

## 2019-12-05 DIAGNOSIS — I6529 Occlusion and stenosis of unspecified carotid artery: Secondary | ICD-10-CM | POA: Diagnosis not present

## 2019-12-05 DIAGNOSIS — F17200 Nicotine dependence, unspecified, uncomplicated: Secondary | ICD-10-CM | POA: Diagnosis not present

## 2019-12-15 DIAGNOSIS — E559 Vitamin D deficiency, unspecified: Secondary | ICD-10-CM | POA: Diagnosis not present

## 2019-12-15 DIAGNOSIS — G894 Chronic pain syndrome: Secondary | ICD-10-CM | POA: Diagnosis not present

## 2019-12-15 DIAGNOSIS — Z79899 Other long term (current) drug therapy: Secondary | ICD-10-CM | POA: Diagnosis not present

## 2019-12-15 DIAGNOSIS — G8929 Other chronic pain: Secondary | ICD-10-CM | POA: Diagnosis not present

## 2019-12-15 DIAGNOSIS — M545 Low back pain: Secondary | ICD-10-CM | POA: Diagnosis not present

## 2019-12-24 ENCOUNTER — Emergency Department (HOSPITAL_COMMUNITY)
Admission: EM | Admit: 2019-12-24 | Discharge: 2019-12-24 | Disposition: A | Discharge status: Home / Self Care | Payer: Medicare HMO | Attending: Emergency Medicine | Admitting: Emergency Medicine

## 2019-12-24 ENCOUNTER — Other Ambulatory Visit: Payer: Self-pay

## 2019-12-24 ENCOUNTER — Encounter (HOSPITAL_COMMUNITY): Payer: Self-pay | Admitting: Emergency Medicine

## 2019-12-24 DIAGNOSIS — H538 Other visual disturbances: Secondary | ICD-10-CM | POA: Diagnosis not present

## 2019-12-24 DIAGNOSIS — Z5321 Procedure and treatment not carried out due to patient leaving prior to being seen by health care provider: Secondary | ICD-10-CM | POA: Insufficient documentation

## 2019-12-24 DIAGNOSIS — R519 Headache, unspecified: Secondary | ICD-10-CM | POA: Insufficient documentation

## 2019-12-24 NOTE — ED Triage Notes (Signed)
Pt c/o of headache and blurred vision since 0800 this morning. Denies hitting his head.  Pt has been out of his oxycodone 10 mg for a week.

## 2019-12-25 DIAGNOSIS — I639 Cerebral infarction, unspecified: Secondary | ICD-10-CM

## 2019-12-25 HISTORY — DX: Cerebral infarction, unspecified: I63.9

## 2019-12-29 ENCOUNTER — Inpatient Hospital Stay (HOSPITAL_COMMUNITY)
Admission: EM | Admit: 2019-12-29 | Discharge: 2019-12-31 | DRG: 065 | Disposition: A | Discharge status: Home / Self Care | Payer: Medicare HMO | Attending: Family Medicine | Admitting: Family Medicine

## 2019-12-29 ENCOUNTER — Other Ambulatory Visit (HOSPITAL_COMMUNITY): Payer: Self-pay | Admitting: Family Medicine

## 2019-12-29 ENCOUNTER — Ambulatory Visit (HOSPITAL_COMMUNITY)
Admission: RE | Admit: 2019-12-29 | Discharge: 2019-12-29 | Disposition: A | Discharge status: Home / Self Care | Payer: Medicare HMO | Source: Ambulatory Visit | Attending: Family Medicine | Admitting: Family Medicine

## 2019-12-29 ENCOUNTER — Encounter (HOSPITAL_COMMUNITY): Payer: Self-pay

## 2019-12-29 ENCOUNTER — Other Ambulatory Visit: Payer: Self-pay

## 2019-12-29 DIAGNOSIS — M47812 Spondylosis without myelopathy or radiculopathy, cervical region: Secondary | ICD-10-CM | POA: Diagnosis present

## 2019-12-29 DIAGNOSIS — G894 Chronic pain syndrome: Secondary | ICD-10-CM

## 2019-12-29 DIAGNOSIS — I63531 Cerebral infarction due to unspecified occlusion or stenosis of right posterior cerebral artery: Principal | ICD-10-CM | POA: Diagnosis present

## 2019-12-29 DIAGNOSIS — Z7902 Long term (current) use of antithrombotics/antiplatelets: Secondary | ICD-10-CM | POA: Diagnosis not present

## 2019-12-29 DIAGNOSIS — Z79899 Other long term (current) drug therapy: Secondary | ICD-10-CM

## 2019-12-29 DIAGNOSIS — Z9889 Other specified postprocedural states: Secondary | ICD-10-CM

## 2019-12-29 DIAGNOSIS — Z20822 Contact with and (suspected) exposure to covid-19: Secondary | ICD-10-CM | POA: Diagnosis present

## 2019-12-29 DIAGNOSIS — I361 Nonrheumatic tricuspid (valve) insufficiency: Secondary | ICD-10-CM | POA: Diagnosis not present

## 2019-12-29 DIAGNOSIS — Z7984 Long term (current) use of oral hypoglycemic drugs: Secondary | ICD-10-CM

## 2019-12-29 DIAGNOSIS — H539 Unspecified visual disturbance: Secondary | ICD-10-CM | POA: Diagnosis present

## 2019-12-29 DIAGNOSIS — F17200 Nicotine dependence, unspecified, uncomplicated: Secondary | ICD-10-CM | POA: Diagnosis not present

## 2019-12-29 DIAGNOSIS — Z885 Allergy status to narcotic agent status: Secondary | ICD-10-CM

## 2019-12-29 DIAGNOSIS — E119 Type 2 diabetes mellitus without complications: Secondary | ICD-10-CM | POA: Diagnosis present

## 2019-12-29 DIAGNOSIS — G8929 Other chronic pain: Secondary | ICD-10-CM | POA: Diagnosis present

## 2019-12-29 DIAGNOSIS — F1120 Opioid dependence, uncomplicated: Secondary | ICD-10-CM | POA: Diagnosis present

## 2019-12-29 DIAGNOSIS — Z981 Arthrodesis status: Secondary | ICD-10-CM | POA: Diagnosis not present

## 2019-12-29 DIAGNOSIS — I10 Essential (primary) hypertension: Secondary | ICD-10-CM | POA: Diagnosis present

## 2019-12-29 DIAGNOSIS — Z833 Family history of diabetes mellitus: Secondary | ICD-10-CM

## 2019-12-29 DIAGNOSIS — Z809 Family history of malignant neoplasm, unspecified: Secondary | ICD-10-CM

## 2019-12-29 DIAGNOSIS — Z8719 Personal history of other diseases of the digestive system: Secondary | ICD-10-CM | POA: Diagnosis not present

## 2019-12-29 DIAGNOSIS — E782 Mixed hyperlipidemia: Secondary | ICD-10-CM | POA: Diagnosis present

## 2019-12-29 DIAGNOSIS — E785 Hyperlipidemia, unspecified: Secondary | ICD-10-CM | POA: Diagnosis present

## 2019-12-29 DIAGNOSIS — H53132 Sudden visual loss, left eye: Secondary | ICD-10-CM

## 2019-12-29 DIAGNOSIS — Z8673 Personal history of transient ischemic attack (TIA), and cerebral infarction without residual deficits: Secondary | ICD-10-CM

## 2019-12-29 DIAGNOSIS — I63011 Cerebral infarction due to thrombosis of right vertebral artery: Secondary | ICD-10-CM | POA: Diagnosis not present

## 2019-12-29 DIAGNOSIS — F17210 Nicotine dependence, cigarettes, uncomplicated: Secondary | ICD-10-CM | POA: Diagnosis present

## 2019-12-29 DIAGNOSIS — I639 Cerebral infarction, unspecified: Secondary | ICD-10-CM | POA: Diagnosis present

## 2019-12-29 DIAGNOSIS — Z8249 Family history of ischemic heart disease and other diseases of the circulatory system: Secondary | ICD-10-CM

## 2019-12-29 DIAGNOSIS — Z886 Allergy status to analgesic agent status: Secondary | ICD-10-CM | POA: Diagnosis not present

## 2019-12-29 LAB — CBC WITH DIFFERENTIAL/PLATELET
Abs Immature Granulocytes: 0.03 10*3/uL (ref 0.00–0.07)
Basophils Absolute: 0 10*3/uL (ref 0.0–0.1)
Basophils Relative: 0 %
Eosinophils Absolute: 0.1 10*3/uL (ref 0.0–0.5)
Eosinophils Relative: 3 %
HCT: 34.9 % — ABNORMAL LOW (ref 39.0–52.0)
Hemoglobin: 11.5 g/dL — ABNORMAL LOW (ref 13.0–17.0)
Immature Granulocytes: 1 %
Lymphocytes Relative: 39 %
Lymphs Abs: 2.2 10*3/uL (ref 0.7–4.0)
MCH: 31.7 pg (ref 26.0–34.0)
MCHC: 33 g/dL (ref 30.0–36.0)
MCV: 96.1 fL (ref 80.0–100.0)
Monocytes Absolute: 0.6 10*3/uL (ref 0.1–1.0)
Monocytes Relative: 10 %
Neutro Abs: 2.7 10*3/uL (ref 1.7–7.7)
Neutrophils Relative %: 47 %
Platelets: 141 10*3/uL — ABNORMAL LOW (ref 150–400)
RBC: 3.63 MIL/uL — ABNORMAL LOW (ref 4.22–5.81)
RDW: 13.5 % (ref 11.5–15.5)
WBC: 5.7 10*3/uL (ref 4.0–10.5)
nRBC: 0 % (ref 0.0–0.2)

## 2019-12-29 LAB — COMPREHENSIVE METABOLIC PANEL
ALT: 24 U/L (ref 0–44)
AST: 21 U/L (ref 15–41)
Albumin: 4.4 g/dL (ref 3.5–5.0)
Alkaline Phosphatase: 98 U/L (ref 38–126)
Anion gap: 8 (ref 5–15)
BUN: 16 mg/dL (ref 8–23)
CO2: 27 mmol/L (ref 22–32)
Calcium: 9.4 mg/dL (ref 8.9–10.3)
Chloride: 101 mmol/L (ref 98–111)
Creatinine, Ser: 1.11 mg/dL (ref 0.61–1.24)
GFR calc Af Amer: >60 mL/min (ref >60)
GFR calc non Af Amer: >60 mL/min (ref >60)
Glucose, Bld: 83 mg/dL (ref 70–99)
Potassium: 3.5 mmol/L (ref 3.5–5.1)
Sodium: 136 mmol/L (ref 135–145)
Total Bilirubin: 0.5 mg/dL (ref 0.3–1.2)
Total Protein: 7.5 g/dL (ref 6.5–8.1)

## 2019-12-29 MED ORDER — OXYCODONE HCL 5 MG PO TABS
10.0000 mg | ORAL_TABLET | Freq: Three times a day (TID) | ORAL | Status: DC | PRN
  Administered 2019-12-30 – 2019-12-31 (×4): 10 mg via ORAL
  Filled 2019-12-29 (×5): qty 2

## 2019-12-29 MED ORDER — CLOPIDOGREL BISULFATE 75 MG PO TABS
75.0000 mg | ORAL_TABLET | Freq: Every day | ORAL | Status: DC
  Administered 2019-12-30 – 2019-12-31 (×2): 75 mg via ORAL
  Filled 2019-12-29 (×2): qty 1

## 2019-12-29 MED ORDER — STROKE: EARLY STAGES OF RECOVERY BOOK
Freq: Once | Status: AC
  Filled 2019-12-29: qty 1

## 2019-12-29 MED ORDER — NICOTINE 21 MG/24HR TD PT24
21.0000 mg | MEDICATED_PATCH | Freq: Every day | TRANSDERMAL | Status: DC
  Filled 2019-12-29 (×2): qty 1

## 2019-12-29 MED ORDER — CLOPIDOGREL BISULFATE 75 MG PO TABS: 75.0000 mg | ORAL_TABLET | Freq: Once | ORAL | Status: DC

## 2019-12-29 MED ORDER — ACETAMINOPHEN 650 MG RE SUPP: 650.0000 mg | RECTAL | Status: DC | PRN

## 2019-12-29 MED ORDER — ACETAMINOPHEN 325 MG PO TABS
650.0000 mg | ORAL_TABLET | ORAL | Status: DC | PRN
  Administered 2019-12-30 (×2): 650 mg via ORAL
  Filled 2019-12-29 (×2): qty 2

## 2019-12-29 MED ORDER — OXYCODONE HCL 10 MG PO TABS: 10.0000 mg | ORAL_TABLET | Freq: Three times a day (TID) | ORAL | Status: DC

## 2019-12-29 MED ORDER — HEPARIN SODIUM (PORCINE) 5000 UNIT/ML IJ SOLN
5000.0000 [IU] | Freq: Three times a day (TID) | INTRAMUSCULAR | Status: DC
  Administered 2019-12-30 – 2019-12-31 (×4): 5000 [IU] via SUBCUTANEOUS
  Filled 2019-12-29 (×3): qty 1

## 2019-12-29 MED ORDER — INSULIN ASPART 100 UNIT/ML ~~LOC~~ SOLN: 0.0000 [IU] | Freq: Three times a day (TID) | SUBCUTANEOUS | Status: DC

## 2019-12-29 MED ORDER — ATORVASTATIN CALCIUM 40 MG PO TABS
40.0000 mg | ORAL_TABLET | Freq: Every day | ORAL | Status: DC
  Filled 2019-12-29: qty 1

## 2019-12-29 MED ORDER — INSULIN ASPART 100 UNIT/ML ~~LOC~~ SOLN: 0.0000 [IU] | Freq: Every day | SUBCUTANEOUS | Status: DC

## 2019-12-29 MED ORDER — ACETAMINOPHEN 160 MG/5ML PO SOLN: 650.0000 mg | ORAL | Status: DC | PRN

## 2019-12-29 NOTE — H&P (Signed)
TRH H&P   Patient Demographics:    Linn Goetze, is a 69 y.o. male  MRN: [id number]   DOB - [birth year]  Admit Date - 12/29/2019  Outpatient Primary MD for the patient is Celene Squibb, MD  Referring MD/NP/PA: Dr Karle Starch  Patient coming from: Home  Chief Complaint  Patient presents with  . Cerebrovascular Accident      HPI:    Markel Kurtenbach  is a 69 y.o. male, with medical history significant of carotid artery disease, cervical spondylosis, history of closed head injury, essential hypertension, colon polyps, history of scarlet fever, history of seizures (last episode many years ago, not on pharmacotherapy), history of polysubstance abuse, type 2 diabetes, history of TIAs prior to carotid endarterectomy, history of CVA on Oct 07, 2016 who is brought to the emergency department for acute CVA, patient reported history of stroke in the past affecting his left arm and leg, patient presents to ED secondary to complaints of visual changes since 7/31, he came to ED once before, but left before being seen, symptoms worsened, but he is unable to see in the left side at all, he has been living alone and getting along pretty well despite having previous CVAs, so his PCP yesterday, who sent him to the ophthalmologist, who was sent in for MRI of the brain, patient was sent further to ED given concerns that the MRI showing acute CVA, she denies any slurred speech, focal deficits beside his vision issue, no fever, no chills, no nausea or vomiting, and received Plavix in ED given his aspirin and allergy, and [facility] hospitalist consulted to admit.    Review of systems:    In addition to the HPI above,  No Fever-chills, No Headache, does report new visual deficit No problems swallowing food or Liquids, No Chest pain, Cough or Shortness of Breath, No Abdominal pain, No Nausea or Vommitting, Bowel movements  are regular, No Blood in stool or Urine, No dysuria, No new skin rashes or bruises, No new joints pains-aches,  No new weakness, tingling, numbness in any extremity, No recent weight gain or loss, No polyuria, polydypsia or polyphagia, No significant Mental Stressors.  A full 10 point Review of Systems was done, except as stated above, all other Review of Systems were negative.   With Past History of the following :    Past Medical History:  Diagnosis Date  . Carotid artery disease ([place])   . Cervical spondylosis   . Closed head injury 1996  . Essential hypertension, benign   . History of colonic polyps   . History of scarlet fever   . History of seizures    unknown etiology and on no meds; last seizure was 2011  . History of TIAs   . Polysubstance abuse ([place])    History of cocaine in his 30's  . Stroke [facility])    weakness of left side  .  Type 2 diabetes mellitus ([place])    Pt stopped metformin 09/2013      Past Surgical History:  Procedure Laterality Date  . ANTERIOR CERVICAL DECOMP/DISCECTOMY FUSION N/A 12/05/2013   Procedure: ANTERIOR CERVICAL DECOMPRESSION/DISCECTOMY FUSION 2 LEVELS;  Surgeon: Elaina Hoops, MD;  Location: [place] [facility] ORS;  Service: Neurosurgery;  Laterality: N/A;  Anterior Cervical Discectomy and Fusion with PEEK Cages and allograft Cervical Four-Five/Five-Six  . CATARACT EXTRACTION W/PHACO Left 05/24/2018   Procedure: CATARACT EXTRACTION PHACO AND INTRAOCULAR LENS PLACEMENT (IOC);  Surgeon: Tonny Branch, MD;  Location: [facility];  Service: Ophthalmology;  Laterality: Left;  CDE: 7.67  . CATARACT EXTRACTION W/PHACO Right 11/21/2019   Procedure: CATARACT EXTRACTION PHACO AND INTRAOCULAR LENS PLACEMENT RIGHT EYE;  Surgeon: Baruch Goldmann, MD;  Location: [facility];  Service: Ophthalmology;  Laterality: Right;  CDE: 6.26  . FRACTURE SURGERY    . Hand laceration repair Left   . Left carotid endarterectomy     6/06 - Dr. Scot Dock  . skin graft to leg Left   . Tibia/fibula  fracture repair Left   . VASCULAR SURGERY        Social History:     Social History   Tobacco Use  . Smoking status: Current Every Day Smoker    Packs/day: 0.50    Years: 55.00    Pack years: 27.50    Types: Cigarettes  . Smokeless tobacco: Never Used  . Tobacco comment: smokes 1/2 pack per day now; reports that he starting to quick  Substance Use Topics  . Alcohol use: Yes    Comment: seldom      Family History :     Family History  Problem Relation Age of Onset  . Cancer Father   . Coronary artery disease Mother   . Diabetes Mother   . Fibromyalgia Sister   . Cancer Brother   . Cancer Brother       Home Medications:   Prior to Admission medications   Medication Sig Start Date End Date Taking? Authorizing Provider  hydrochlorothiazide (HYDRODIURIL) 12.5 MG tablet Take 12.5 mg by mouth daily. 08/10/19  Yes [provider]  [place] test strip USE ONE STRIP TO [place] GLUCOSE TWICE DAILY 06/28/17   [place], Betty G, MD  ACCU-CHEK SOFTCLIX LANCETS lancets USE ONE LANCET TWICE DAILY AS DIRECTED TO CHECK BLOOD GLUCOSE 06/28/17   [place], Betty G, MD  amitriptyline (ELAVIL) 150 MG tablet TAKE ONE TABLET BY MOUTH AT BEDTIME AS NEEDED FOR SLEEP Patient taking differently: Take 150 mg by mouth at bedtime.  01/07/17   [place], Betty G, MD  atorvastatin (LIPITOR) 40 MG tablet Take 1 tablet (40 mg total) by mouth daily at 6 PM. 01/08/17   Reyne Dumas, MD  baclofen (LIORESAL) 10 MG tablet Take 10 mg by mouth 2 (two) times daily. 02/16/18   [provider]  clopidogrel (PLAVIX) 75 MG tablet Take 1 tablet (75 mg total) by mouth daily with breakfast. 01/09/17   Reyne Dumas, MD  losartan (COZAAR) 100 MG tablet Take 100 mg by mouth daily.    [provider]  metFORMIN (GLUCOPHAGE-XR) 500 MG 24 hr tablet Take 1 tablet (500 mg total) by mouth daily with breakfast. 01/13/17   [place], Betty G, MD  Multiple Vitamins-Minerals (MULTIVITAMIN WITH MINERALS) tablet  Take 1 tablet by mouth daily.    [provider]  Oxycodone HCl 10 MG TABS Take 10 mg by mouth in the morning, at noon, in the evening, and at  bedtime.  03/10/18   [provider]     Allergies:     Allergies  Allergen Reactions  . Aspirin Nausea And Vomiting  . Hydrocodone Nausea Only     Physical Exam:   Vitals  Blood pressure (!) 196/88, pulse 65, temperature 97.9 F (36.6 C), temperature source Oral, resp. rate 16, SpO2 100 %.   1. General well developed male, lying in bed in NAD,    2. Normal affect and insight, Not Suicidal or Homicidal, Awake Alert, Oriented X 3.  3.  Patient with very mild left facial droop, decreased tone in left upper and lower extremities, with dense left visual field deficit .  4. Ears and Eyes appear Normal, Conjunctivae clear, PERRLA. Moist Oral Mucosa.  5. Supple Neck, No JVD, No cervical lymphadenopathy appriciated, No Carotid Bruits.  6. Symmetrical Chest wall movement, Good air movement bilaterally, CTAB.  7. RRR, No Gallops, Rubs or Murmurs, No Parasternal Heave.  8. Positive Bowel Sounds, Abdomen Soft, No tenderness, No organomegaly appriciated,No rebound -guarding or rigidity.  9.  No Cyanosis, Normal Skin Turgor, No Skin Rash or Bruise.  10. Good muscle tone,  joints appear normal , no effusions, Normal ROM.  11. No Palpable Lymph Nodes in Neck or Axillae    Data Review:    CBC Recent Labs  Lab 12/29/19 1615  WBC 5.7  HGB 11.5*  HCT 34.9*  PLT 141*  MCV 96.1  MCH 31.7  MCHC 33.0  RDW 13.5  LYMPHSABS 2.2  MONOABS 0.6  EOSABS 0.1  BASOSABS 0.0   ------------------------------------------------------------------------------------------------------------------  Chemistries  Recent Labs  Lab 12/29/19 1615  NA 136  K 3.5  CL 101  CO2 27  GLUCOSE 83  BUN 16  CREATININE 1.11  CALCIUM 9.4  AST 21  ALT 24  ALKPHOS 98  BILITOT 0.5    ------------------------------------------------------------------------------------------------------------------ estimated creatinine clearance is 57.6 mL/min (by C-G formula based on SCr of 1.11 mg/dL). ------------------------------------------------------------------------------------------------------------------ No results for input(s): TSH, T4TOTAL, T3FREE, THYROIDAB in the last 72 hours.  Invalid input(s): FREET3  Coagulation profile No results for input(s): INR, PROTIME in the last 168 hours. ------------------------------------------------------------------------------------------------------------------- No results for input(s): DDIMER in the last 72 hours. -------------------------------------------------------------------------------------------------------------------  Cardiac Enzymes No results for input(s): CKMB, TROPONINI, MYOGLOBIN in the last 168 hours.  Invalid input(s): CK ------------------------------------------------------------------------------------------------------------------ No results found for: BNP   ---------------------------------------------------------------------------------------------------------------  Urinalysis    Component Value Date/Time   COLORURINE STRAW (A) 01/07/2017 0944   APPEARANCEUR CLEAR 01/07/2017 0944   LABSPEC 1.006 01/07/2017 0944   PHURINE 6.0 01/07/2017 0944   GLUCOSEU 50 (A) 01/07/2017 0944   HGBUR NEGATIVE 01/07/2017 [place] NEGATIVE 01/07/2017 [place] 01/07/2017 0944   PROTEINUR NEGATIVE 01/07/2017 0944   NITRITE NEGATIVE 01/07/2017 0944   LEUKOCYTESUR NEGATIVE 01/07/2017 0944    ----------------------------------------------------------------------------------------------------------------   Imaging Results:    MR ANGIO HEAD WO CONTRAST  Result Date: 12/29/2019 CLINICAL DATA:  Peripheral vision loss in left eye. The patient is now in the emergency department. EXAM: MRI HEAD  WITHOUT CONTRAST MRA HEAD WITHOUT CONTRAST TECHNIQUE: Multiplanar, multiecho pulse sequences of the brain and surrounding structures were obtained without intravenous contrast. Angiographic images of the head were obtained using MRA technique without contrast. COMPARISON:  07/13/2017 FINDINGS: MRI HEAD FINDINGS Brain: There is a large acute right PCA territory infarct involving the occipital and posterior temporal lobes with associated cytotoxic edema resulting in regional sulcal effacement and minimal mass effect on the right lateral ventricle.  There are multiple chronic right cerebral infarcts in the MCA territory and border zone (acute on the prior MRI) including a moderate-sized chronic infarct in the posterior frontal and anterior parietal lobes. There is also a small chronic infarct in the right cerebellum. Small foci of T2 hyperintensity elsewhere in the cerebral white matter bilaterally and in the pons have mildly progressed and are nonspecific but compatible with mild chronic small vessel ischemic disease. No intracranial hemorrhage, mass, midline shift, or extra-axial fluid collection is identified. There is mild cerebral atrophy. Vascular: Major intracranial vascular flow voids are preserved. Skull and upper cervical spine: Unremarkable bone marrow signal. Advanced upper cervical facet arthrosis. Susceptibility artifact at C4 related to anterior fusion. Sinuses/Orbits: Bilateral cataract extraction. Paranasal sinuses and mastoid air cells are clear. Other: None. MRA HEAD FINDINGS The visualized distal vertebral arteries are patent to the basilar and codominant. There are severe bilateral V4 stenoses which are new on the right and progressive on the left. The right PICA and left AICA appear dominant. Patent SCAs are seen bilaterally with a chronic severe proximal stenosis noted on the right. The basilar artery is patent with diffuse irregularity but only up to mild stenosis. Posterior communicating  arteries are not identified and may be diminutive or absent. The right PCA is widely patent proximally, however there is new occlusion of the dominant superior P3 branch. The left PCA is patent with a new severe P1 origin stenosis as well as progressive severe left P2 stenosis. Both internal carotid arteries now demonstrate long segmental flow gaps involving the cavernous and proximal supraclinoid segments. There is a new severe stenosis at the left ICA terminus involving the ACA and MCA origins. There are also new severe proximal right A1, distal left M1, and proximal bilateral M2 stenoses. No aneurysm is identified. IMPRESSION: 1. Large acute right PCA infarct. 2. Chronic ischemia with multiple old infarcts as above. 3. Progressive intracranial atherosclerosis with multiple new severe stenoses in the anterior and posterior circulations as well as new right P3 occlusion. Electronically Signed   By: Logan Bores M.D.   On: 12/29/2019 16:05   MR BRAIN WO CONTRAST  Result Date: 12/29/2019 CLINICAL DATA:  Peripheral vision loss in left eye. The patient is now in the emergency department. EXAM: MRI HEAD WITHOUT CONTRAST MRA HEAD WITHOUT CONTRAST TECHNIQUE: Multiplanar, multiecho pulse sequences of the brain and surrounding structures were obtained without intravenous contrast. Angiographic images of the head were obtained using MRA technique without contrast. COMPARISON:  07/13/2017 FINDINGS: MRI HEAD FINDINGS Brain: There is a large acute right PCA territory infarct involving the occipital and posterior temporal lobes with associated cytotoxic edema resulting in regional sulcal effacement and minimal mass effect on the right lateral ventricle. There are multiple chronic right cerebral infarcts in the MCA territory and border zone (acute on the prior MRI) including a moderate-sized chronic infarct in the posterior frontal and anterior parietal lobes. There is also a small chronic infarct in the right cerebellum.  Small foci of T2 hyperintensity elsewhere in the cerebral white matter bilaterally and in the pons have mildly progressed and are nonspecific but compatible with mild chronic small vessel ischemic disease. No intracranial hemorrhage, mass, midline shift, or extra-axial fluid collection is identified. There is mild cerebral atrophy. Vascular: Major intracranial vascular flow voids are preserved. Skull and upper cervical spine: Unremarkable bone marrow signal. Advanced upper cervical facet arthrosis. Susceptibility artifact at C4 related to anterior fusion. Sinuses/Orbits: Bilateral cataract extraction. Paranasal sinuses and mastoid air cells are  clear. Other: None. MRA HEAD FINDINGS The visualized distal vertebral arteries are patent to the basilar and codominant. There are severe bilateral V4 stenoses which are new on the right and progressive on the left. The right PICA and left AICA appear dominant. Patent SCAs are seen bilaterally with a chronic severe proximal stenosis noted on the right. The basilar artery is patent with diffuse irregularity but only up to mild stenosis. Posterior communicating arteries are not identified and may be diminutive or absent. The right PCA is widely patent proximally, however there is new occlusion of the dominant superior P3 branch. The left PCA is patent with a new severe P1 origin stenosis as well as progressive severe left P2 stenosis. Both internal carotid arteries now demonstrate long segmental flow gaps involving the cavernous and proximal supraclinoid segments. There is a new severe stenosis at the left ICA terminus involving the ACA and MCA origins. There are also new severe proximal right A1, distal left M1, and proximal bilateral M2 stenoses. No aneurysm is identified. IMPRESSION: 1. Large acute right PCA infarct. 2. Chronic ischemia with multiple old infarcts as above. 3. Progressive intracranial atherosclerosis with multiple new severe stenoses in the anterior and  posterior circulations as well as new right P3 occlusion. Electronically Signed   By: Logan Bores M.D.   On: 12/29/2019 16:05    My personal review of EKG: Rhythm NSR, Rate  68 /min, QTc 432   Assessment & Plan:    Active Problems:   Mixed hyperlipidemia   TOBACCO ABUSE   Essential hypertension, benign   CAROTID ENDARTERECTOMY, LEFT, HX OF   Chronic pain disorder   CVA (cerebral vascular accident) ([place])   Type 2 diabetes mellitus ([place])   Acute CVA (cerebrovascular accident) ([place])   Acute CVA -Significant for large acute right PCA infarct, as well with chronic ischemia with multiple old infarcts, with progressive intracranial atherosclerosis with multiple new severe stenosis, for now we will continue with Plavix, he does report aspirin allergy, will check A1c, lipid panel, will hold antihypertensive regimen and allow for permissive hypertension, will check 2D echo, will check carotid Dopplers especially in the setting of history of left carotid stenosis status post endarterectomy, - neurology consult requested in Epic.  Hypertension - [place]low for permissive hypertension  Diabetes mellitus -Check A1c, hold Metformin, will keep on insulin sliding scale  Hyperlipidemia -Continue with home dose statin, check A1c  Tobacco abuse -Counseled, will start on nicotine patch.  Chronic pain disorder -Continue with home medication  History of carotid stenosis status post left carotid endarterectomy -We will check carotid Dopplers, continue with Plavix and statin   DVT Prophylaxis Heparin -  SCDs  AM Labs Ordered, also please review Full Orders  Family Communication: Admission, patients condition and plan of care including tests being ordered have been discussed with the patient and  who indicate understanding and agree with the plan and Code Status.  Code Status Full  Likely DC to Home  Condition GUARDED    Consults called:  Neurology requested in EPIC  Admission status:   inpatient  Time spent in minutes : 60 minutes   Phillips Climes M.D on 12/29/2019 at 8:39 PM   [facility] - Office  [phone]

## 2019-12-29 NOTE — ED Triage Notes (Addendum)
Pt started having left sided vision loss that began 5 days ago. Was seen at Dr. Juel Burrow office yesterday. Had an appointment with [facility]. Sent over for outpatient MRI. MRI showed massive stroke. Pt alert and oriented.

## 2019-12-29 NOTE — ED Provider Notes (Signed)
[facility] EMERGENCY DEPARTMENT Provider Note  CSN: [id number] Arrival date & time: 12/29/19 1545    History Chief Complaint  Patient presents with  . Cerebrovascular Accident    HPI  Ryan Butler is a 69 y.o. male with prior history of stroke affecting his left arm and leg reports he has had visual changes since 7/31. Came to the ED that day but left before being seen. Symptoms have worsened since then, now unable to see on the left side at all. He has been living alone and getting along pretty well despite his prior strokes. He was seen yesterday by PCP and then sent to Ophthalmology today who then sent him for MRI. He was brought to the ED from MRI with concerns for acute stroke. Patient only complains of low back pain because he has run out of his medications at home.    Past Medical History:  Diagnosis Date  . Carotid artery disease ([place])   . Cervical spondylosis   . Closed head injury 1996  . Essential hypertension, benign   . History of colonic polyps   . History of scarlet fever   . History of seizures    unknown etiology and on no meds; last seizure was 2011  . History of TIAs   . Polysubstance abuse ([place])    History of cocaine in his 30's  . Stroke [facility])    weakness of left side  . Type 2 diabetes mellitus ([place])    Pt stopped metformin 09/2013    Past Surgical History:  Procedure Laterality Date  . ANTERIOR CERVICAL DECOMP/DISCECTOMY FUSION N/A 12/05/2013   Procedure: ANTERIOR CERVICAL DECOMPRESSION/DISCECTOMY FUSION 2 LEVELS;  Surgeon: Elaina Hoops, MD;  Location: [place] NEURO ORS;  Service: Neurosurgery;  Laterality: N/A;  Anterior Cervical Discectomy and Fusion with PEEK Cages and allograft Cervical Four-Five/Five-Six  . CATARACT EXTRACTION W/PHACO Left 05/24/2018   Procedure: CATARACT EXTRACTION PHACO AND INTRAOCULAR LENS PLACEMENT (IOC);  Surgeon: Tonny Branch, MD;  Location: [facility];  Service: Ophthalmology;  Laterality: Left;  CDE: 7.67  . CATARACT EXTRACTION  W/PHACO Right 11/21/2019   Procedure: CATARACT EXTRACTION PHACO AND INTRAOCULAR LENS PLACEMENT RIGHT EYE;  Surgeon: Baruch Goldmann, MD;  Location: [facility];  Service: Ophthalmology;  Laterality: Right;  CDE: 6.26  . FRACTURE SURGERY    . Hand laceration repair Left   . Left carotid endarterectomy     6/06 - Dr. Scot Dock  . skin graft to leg Left   . Tibia/fibula fracture repair Left   . VASCULAR SURGERY      Family History  Problem Relation Age of Onset  . Cancer Father   . Coronary artery disease Mother   . Diabetes Mother   . Fibromyalgia Sister   . Cancer Brother   . Cancer Brother     Social History   Tobacco Use  . Smoking status: Current Every Day Smoker    Packs/day: 0.50    Years: 55.00    Pack years: 27.50    Types: Cigarettes  . Smokeless tobacco: Never Used  . Tobacco comment: smokes 1/2 pack per day now; reports that he starting to quick  Vaping Use  . Vaping Use: Never used  Substance Use Topics  . Alcohol use: Yes    Comment: seldom  . Drug use: Yes    Types: Marijuana, Oxycodone    Comment: uses daily; instructed not to use prior to procedure.     Home Medications Prior to Admission medications  Medication Sig Start Date End Date Taking? Authorizing Provider  hydrochlorothiazide (HYDRODIURIL) 12.5 MG tablet Take 12.5 mg by mouth daily. 08/10/19  Yes [provider]  [place] test strip USE ONE STRIP TO [address] GLUCOSE TWICE DAILY 06/28/17   [place], Betty G, MD  ACCU-CHEK SOFTCLIX LANCETS lancets USE ONE LANCET TWICE DAILY AS DIRECTED TO CHECK BLOOD GLUCOSE 06/28/17   [place], Betty G, MD  amitriptyline (ELAVIL) 150 MG tablet TAKE ONE TABLET BY MOUTH AT BEDTIME AS NEEDED FOR SLEEP Patient taking differently: Take 150 mg by mouth at bedtime.  01/07/17   [place], Betty G, MD  atorvastatin (LIPITOR) 40 MG tablet Take 1 tablet (40 mg total) by mouth daily at 6 PM. 01/08/17   Reyne Dumas, MD  baclofen (LIORESAL) 10 MG tablet Take 10 mg by mouth 2  (two) times daily. 02/16/18   [provider]  clopidogrel (PLAVIX) 75 MG tablet Take 1 tablet (75 mg total) by mouth daily with breakfast. 01/09/17   Reyne Dumas, MD  losartan (COZAAR) 100 MG tablet Take 100 mg by mouth daily.    [provider]  metFORMIN (GLUCOPHAGE-XR) 500 MG 24 hr tablet Take 1 tablet (500 mg total) by mouth daily with breakfast. 01/13/17   [place], Betty G, MD  Multiple Vitamins-Minerals (MULTIVITAMIN WITH MINERALS) tablet Take 1 tablet by mouth daily.    [provider]  Oxycodone HCl 10 MG TABS Take 10 mg by mouth in the morning, at noon, in the evening, and at bedtime.  03/10/18   [provider]     Allergies    Aspirin and Hydrocodone   Review of Systems   Review of Systems A comprehensive review of systems was completed and negative except as noted in HPI.    Physical Exam BP (!) 198/101   Pulse 89   Temp 97.9 F (36.6 C) (Oral)   Resp 16   SpO2 99%   Physical Exam Vitals and nursing note reviewed.  Constitutional:      Appearance: Normal appearance.  HENT:     Head: Normocephalic and atraumatic.     Nose: Nose normal.     Mouth/Throat:     Mouth: Mucous membranes are moist.  Eyes:     Extraocular Movements: Extraocular movements intact.     Conjunctiva/sclera: Conjunctivae normal.  Cardiovascular:     Rate and Rhythm: Normal rate.  Pulmonary:     Effort: Pulmonary effort is normal.     Breath sounds: Normal breath sounds.  Abdominal:     General: Abdomen is flat.     Palpations: Abdomen is soft.     Tenderness: There is no abdominal tenderness.  Musculoskeletal:        General: No swelling. Normal range of motion.     Cervical back: Neck supple.  Skin:    General: Skin is warm and dry.  Neurological:     Mental Status: He is alert and oriented to person, place, and time.     Cranial Nerves: Cranial nerve deficit (L facial droop) present.     Sensory: No sensory deficit.     Motor: No weakness (mild  increased tone of LUE and LLE).     Comments: Dense left visual field deficit/homonymous hemianopia.   Psychiatric:        Mood and Affect: Mood normal.      ED Results / Procedures / Treatments   Labs (all labs ordered are listed, but only abnormal results are displayed) Labs Reviewed  CBC WITH DIFFERENTIAL/PLATELET - Abnormal; Notable for the following components:      Result Value   RBC 3.63 (*)    Hemoglobin 11.5 (*)    HCT 34.9 (*)    Platelets 141 (*)    All other components within normal limits  COMPREHENSIVE METABOLIC PANEL  HEMOGLOBIN A1C  LIPID PANEL    EKG None  Radiology FINDINGS: MRI HEAD FINDINGS  Brain: There is a large acute right PCA territory infarct involving the occipital and posterior temporal lobes with associated cytotoxic edema resulting in regional sulcal effacement and minimal mass effect on the right lateral ventricle. There are multiple chronic right cerebral infarcts in the MCA territory and border zone (acute on the prior MRI) including a moderate-sized chronic infarct in the posterior frontal and anterior parietal lobes. There is also a small chronic infarct in the right cerebellum.  Small foci of T2 hyperintensity elsewhere in the cerebral white matter bilaterally and in the pons have mildly progressed and are nonspecific but compatible with mild chronic small vessel ischemic disease. No intracranial hemorrhage, mass, midline shift, or extra-axial fluid collection is identified. There is mild cerebral atrophy.  Vascular: Major intracranial vascular flow voids are preserved.  Skull and upper cervical spine: Unremarkable bone marrow signal. Advanced upper cervical facet arthrosis. Susceptibility artifact at C4 related to anterior fusion.  Sinuses/Orbits: Bilateral cataract extraction. Paranasal sinuses and mastoid air cells are clear.  Other: None.  MRA HEAD FINDINGS  The visualized distal vertebral arteries are patent  to the basilar and codominant. There are severe bilateral V4 stenoses which are new on the right and progressive on the left. The right PICA and left AICA appear dominant. Patent SCAs are seen bilaterally with a chronic severe proximal stenosis noted on the right. The basilar artery is patent with diffuse irregularity but only up to mild stenosis. Posterior communicating arteries are not identified and may be diminutive or absent.  The right PCA is widely patent proximally, however there is new occlusion of the dominant superior P3 branch. The left PCA is patent with a new severe P1 origin stenosis as well as progressive severe left P2 stenosis.  Both internal carotid arteries now demonstrate long segmental flow gaps involving the cavernous and proximal supraclinoid segments. There is a new severe stenosis at the left ICA terminus involving the ACA and MCA origins. There are also new severe proximal right A1, distal left M1, and proximal bilateral M2 stenoses. No aneurysm is identified.  IMPRESSION: 1. Large acute right PCA infarct. 2. Chronic ischemia with multiple old infarcts as above. 3. Progressive intracranial atherosclerosis with multiple new severe stenoses in the anterior and posterior circulations as well as new right P3 occlusion.  Procedures Procedures  Medications Ordered in the ED Medications   stroke: mapping our early stages of recovery book (has no administration in time range)  acetaminophen (TYLENOL) tablet 650 mg (has no administration in time range)    Or  acetaminophen (TYLENOL) 160 MG/5ML solution 650 mg (has no administration in time range)    Or  acetaminophen (TYLENOL) suppository 650 mg (has no administration in time range)  heparin injection 5,000 Units (has no administration in time range)  clopidogrel (PLAVIX) tablet 75 mg (has no administration in time range)  nicotine (NICODERM CQ - dosed in mg/24 hours) patch 21 mg (has no administration in  time range)  atorvastatin (LIPITOR) tablet 40 mg (has no administration in time range)  clopidogrel (PLAVIX) tablet 75 mg (has no administration in time  range)  insulin aspart (novoLOG) injection 0-15 Units (has no administration in time range)  insulin aspart (novoLOG) injection 0-5 Units (has no administration in time range)  oxyCODONE (Oxy IR/ROXICODONE) immediate release tablet 10 mg (has no administration in time range)     MDM Rules/Calculators/A&P MDM Patient's exam concerning for acute occipital stroke. Just had an outpatient MRI before coming to the ED. He is not a candidate for tPA given remote symptom onset. Will check his labs, EKG and await official MRI report.  ED Course  I have reviewed the triage vital signs and the nursing notes.  Pertinent labs & imaging results that were available during my care of the patient were reviewed by me and considered in my medical decision making (see chart for details).  Clinical Course as of Dec 29 2214  Thu Dec 29, 2019  1645 MRI confirms large acute infarct and severe cerebrovascular disease. See full report above.  CBC shows mild anemia.    [CS]  9914 CMP is normal.    [CS]  1650 EKG: Rate 68 Rhythm: NSR Axis; normal Intervals: normal ST/T normal   [CS]  1654 Discussed MRI results with patient and granddaughter at bedside. Recommend admission for evaluation by neurology and to discuss ongoing medication regiment to prevent further strokes. Patient reluctant but will agree to admission.    [CS]  1737 Spoke with Hospitalist who will admit. They would like for me to ask Neurology if they would recommend Plavix load for this patient.    [CS]  [id number] Spoke with Dr. Merlene Laughter who recommended the regular dose of Plavix and not a loading dose.    [CS]    Clinical Course User Index [CS] Truddie Hidden, MD    Final Clinical Impression(s) / ED Diagnoses Final diagnoses:  Occipital stroke [facility])  Stroke [facility])    Rx / DC  Orders ED Discharge Orders    None       Truddie Hidden, MD 12/29/19 2216

## 2019-12-29 NOTE — ED Notes (Signed)
C/o headache for last 2 days.

## 2019-12-30 ENCOUNTER — Inpatient Hospital Stay (HOSPITAL_COMMUNITY): Payer: Medicare HMO

## 2019-12-30 DIAGNOSIS — I361 Nonrheumatic tricuspid (valve) insufficiency: Secondary | ICD-10-CM

## 2019-12-30 DIAGNOSIS — I63011 Cerebral infarction due to thrombosis of right vertebral artery: Secondary | ICD-10-CM

## 2019-12-30 DIAGNOSIS — Z9889 Other specified postprocedural states: Secondary | ICD-10-CM

## 2019-12-30 LAB — ECHOCARDIOGRAM COMPLETE
AR max vel: 2.43 cm2
AV Area VTI: 2.69 cm2
AV Area mean vel: 2.41 cm2
AV Mean grad: 2.5 mmHg
AV Peak grad: 4.9 mmHg
Ao pk vel: 1.11 m/s
Area-P 1/2: 3.33 cm2
S' Lateral: 2.62 cm

## 2019-12-30 LAB — LIPID PANEL
Cholesterol: 95 mg/dL (ref 0–200)
HDL: 41 mg/dL (ref >40)
LDL Cholesterol: 40 mg/dL (ref 0–99)
Total CHOL/HDL Ratio: 2.3 RATIO
Triglycerides: 70 mg/dL (ref <150)
VLDL: 14 mg/dL (ref 0–40)

## 2019-12-30 LAB — GLUCOSE, CAPILLARY
Glucose-Capillary: 101 mg/dL — ABNORMAL HIGH (ref 70–99)
Glucose-Capillary: 88 mg/dL (ref 70–99)
Glucose-Capillary: 113 mg/dL — ABNORMAL HIGH (ref 70–99)
Glucose-Capillary: 83 mg/dL (ref 70–99)
Glucose-Capillary: 91 mg/dL (ref 70–99)

## 2019-12-30 LAB — HEMOGLOBIN A1C
Hgb A1c MFr Bld: 6.2 % — ABNORMAL HIGH (ref 4.8–5.6)
Mean Plasma Glucose: 131.24 mg/dL

## 2019-12-30 LAB — SARS CORONAVIRUS 2 BY RT PCR (HOSPITAL ORDER, PERFORMED IN ~~LOC~~ HOSPITAL LAB): SARS Coronavirus 2: NEGATIVE

## 2019-12-30 MED ORDER — ATORVASTATIN CALCIUM 40 MG PO TABS
80.0000 mg | ORAL_TABLET | Freq: Every day | ORAL | Status: DC
  Administered 2019-12-30 (×2): 80 mg via ORAL
  Filled 2019-12-30: qty 2

## 2019-12-30 MED ORDER — MENTHOL 3 MG MT LOZG
1.0000 | LOZENGE | OROMUCOSAL | Status: DC | PRN
  Administered 2019-12-30 – 2019-12-31 (×2): 3 mg via ORAL
  Filled 2019-12-30 (×2): qty 9

## 2019-12-30 NOTE — Progress Notes (Signed)
PROGRESS NOTE   Ryan Butler  HEN:[id number] DOB: [birth year] DOA: 12/29/2019 PCP: Celene Squibb, MD   Chief Complaint  Patient presents with  . Cerebrovascular Accident    Brief Admission History:  69 y.o. male, with medical history significant ofcarotid artery disease, cervical spondylosis, history of closed head injury, essential hypertension, colon polyps, history of scarlet fever, history of seizures (last episode many years ago, not on pharmacotherapy), history of polysubstance abuse, type 2 diabetes, history of TIAsprior to carotid endarterectomy, history of CVA on May 15, 2018who is brought to the emergency department for acute CVA, patient reported history of stroke in the past affecting his left arm and leg, patient presents to ED secondary to complaints of visual changes since 7/31, he came to ED once before, but left before being seen, symptoms worsened, but he is unable to see in the left side at all, he has been living alone and getting along pretty well despite having previous CVAs, so his PCP yesterday, who sent him to the ophthalmologist, who was sent in for MRI of the brain, patient was sent further to ED given concerns that the MRI showing acute CVA, she denies any slurred speech, focal deficits beside his vision issue, no fever, no chills, no nausea or vomiting, and received Plavix in ED given his aspirin and allergy, and [facility] hospitalist consulted to admit.  Assessment & Plan:   Active Problems:   Mixed hyperlipidemia   TOBACCO ABUSE   Essential hypertension, benign   CAROTID ENDARTERECTOMY, LEFT, HX OF   Chronic pain disorder   CVA (cerebral vascular accident) ([place])   Type 2 diabetes mellitus ([place])   Acute CVA (cerebrovascular accident) ([place])   1. Acute CVA - MRI with findings of large acute right PCA territory infarct in addition to chronic ischemia and vascular stenosis.  He remains on plavix.  He reports allergy to aspirin. Continue full stroke workup. Neurology  consultation requested.  2. HTN - allowing for permissive hypertension.  3. Type 2 DM - continue SSI coverage, check A1c.  4. Hyperlipidemia - increase atorvastatin to 80 mg daily.  5. Tobacco use - STRONGLY ADVISED PATIENT TO QUIT NOW.  HE REFUSES NICOTINE PATCH.  6. Chronic pain / opioid dependence - resumed home meds.  7. History of carotid stenosis s/p left CEA - follow up carotid dopplers, continue plavix and atorvastatin.  Increased atorvastatin to 80 mg.   8. Lost vision left eye - Pt reports lost peripheral vision left eye.    DVT prophylaxis: SCDs Code Status: Full  Family Communication: daughter at bedside  Disposition:   Status is: Inpatient  Remains inpatient appropriate because:IV treatments appropriate due to intensity of illness or inability to take PO and Inpatient level of care appropriate due to severity of illness  Dispo: The patient is from: Home              Anticipated d/c is to: Home              Anticipated d/c date is: 1 day              Patient currently is medically stable to d/c.  Consultants:   Neurology   Procedures:     Antimicrobials:     Subjective: Pt reports left eye peripheral vision is gone.   Objective: Vitals:   12/30/19 0604 12/30/19 0800 12/30/19 1000 12/30/19 1500  BP: 140/71 (!) 159/71 (!) 149/76 (!) 185/84  Pulse: 70 70 66 80  Resp: 16 16  18 18  Temp:  98 F (36.7 C) 97.8 F (36.6 C) 98.3 F (36.8 C)  TempSrc:  Oral Oral   SpO2: 98% 99% 100% 100%    Intake/Output Summary (Last 24 hours) at 12/30/2019 1712 Last data filed at 12/30/2019 1300 Gross per 24 hour  Intake 480 ml  Output --  Net 480 ml   There were no vitals filed for this visit.  Examination:  General exam: Appears calm and comfortable  Respiratory system: Clear to auscultation. Respiratory effort normal. Cardiovascular system: S1 & S2 heard, RRR. No JVD, murmurs, rubs, gallops or clicks. No pedal edema. Gastrointestinal system: Abdomen is nondistended,  soft and nontender. No organomegaly or masses felt. Normal bowel sounds heard. Central nervous system: Alert and oriented. No focal neurological deficits. Extremities: Symmetric 5 x 5 power. Skin: No rashes, lesions or ulcers Psychiatry: Judgement and insight appear normal. Mood & affect appropriate.   Data Reviewed: I have personally reviewed following labs and imaging studies  CBC: Recent Labs  Lab 12/29/19 1615  WBC 5.7  NEUTROABS 2.7  HGB 11.5*  HCT 34.9*  MCV 96.1  PLT 141*    Basic Metabolic Panel: Recent Labs  Lab 12/29/19 1615  NA 136  K 3.5  CL 101  CO2 27  GLUCOSE 83  BUN 16  CREATININE 1.11  CALCIUM 9.4    GFR: Estimated Creatinine Clearance: 57.6 mL/min (by C-G formula based on SCr of 1.11 mg/dL).  Liver Function Tests: Recent Labs  Lab 12/29/19 1615  AST 21  ALT 24  ALKPHOS 98  BILITOT 0.5  PROT 7.5  ALBUMIN 4.4    CBG: Recent Labs  Lab 12/30/19 0227 12/30/19 0738 12/30/19 1154 12/30/19 1606  GLUCAP 101* 88 113* 83    Recent Results (from the past 240 hour(s))  SARS Coronavirus 2 by RT PCR (hospital order, performed in [facility] hospital lab) Nasopharyngeal Nasopharyngeal Swab     Status: None   Collection Time: 12/30/19  3:32 AM   Specimen: Nasopharyngeal Swab  Result Value Ref Range Status   SARS Coronavirus 2 NEGATIVE NEGATIVE Final    Comment: (NOTE) SARS-CoV-2 target nucleic acids are NOT DETECTED.  The SARS-CoV-2 RNA is generally detectable in upper and lower respiratory specimens during the acute phase of infection. The lowest concentration of SARS-CoV-2 viral copies this assay can detect is 250 copies / mL. A negative result does not preclude SARS-CoV-2 infection and should not be used as the sole basis for treatment or other patient management decisions.  A negative result may occur with improper specimen collection / handling, submission of specimen other than nasopharyngeal swab, presence of viral mutation(s) within  the areas targeted by this assay, and inadequate number of viral copies (<250 copies / mL). A negative result must be combined with clinical observations, patient history, and epidemiological information.  Fact Sheet for Patients:   StrictlyIdeas.no  Fact Sheet for Healthcare Providers: BankingDealers.co.za  This test is not yet approved or  cleared by the [place] FDA and has been authorized for detection and/or diagnosis of SARS-CoV-2 by FDA under an Emergency Use Authorization (EUA).  This EUA will remain in effect (meaning this test can be used) for the duration of the COVID-19 declaration under Section 564(b)(1) of the Act, 21 U.S.C. section 360bbb-3(b)(1), unless the authorization is terminated or revoked sooner.  Performed at [facility], [address] [place], [place] [address]      Radiology Studies: MR ANGIO HEAD WO CONTRAST  Result Date: 12/29/2019  CLINICAL DATA:  Peripheral vision loss in left eye. The patient is now in the emergency department. EXAM: MRI HEAD WITHOUT CONTRAST MRA HEAD WITHOUT CONTRAST TECHNIQUE: Multiplanar, multiecho pulse sequences of the brain and surrounding structures were obtained without intravenous contrast. Angiographic images of the head were obtained using MRA technique without contrast. COMPARISON:  07/13/2017 FINDINGS: MRI HEAD FINDINGS Brain: There is a large acute right PCA territory infarct involving the occipital and posterior temporal lobes with associated cytotoxic edema resulting in regional sulcal effacement and minimal mass effect on the right lateral ventricle. There are multiple chronic right cerebral infarcts in the MCA territory and border zone (acute on the prior MRI) including a moderate-sized chronic infarct in the posterior frontal and anterior parietal lobes. There is also a small chronic infarct in the right cerebellum. Small foci of T2 hyperintensity elsewhere in the cerebral  white matter bilaterally and in the pons have mildly progressed and are nonspecific but compatible with mild chronic small vessel ischemic disease. No intracranial hemorrhage, mass, midline shift, or extra-axial fluid collection is identified. There is mild cerebral atrophy. Vascular: Major intracranial vascular flow voids are preserved. Skull and upper cervical spine: Unremarkable bone marrow signal. Advanced upper cervical facet arthrosis. Susceptibility artifact at C4 related to anterior fusion. Sinuses/Orbits: Bilateral cataract extraction. Paranasal sinuses and mastoid air cells are clear. Other: None. MRA HEAD FINDINGS The visualized distal vertebral arteries are patent to the basilar and codominant. There are severe bilateral V4 stenoses which are new on the right and progressive on the left. The right PICA and left AICA appear dominant. Patent SCAs are seen bilaterally with a chronic severe proximal stenosis noted on the right. The basilar artery is patent with diffuse irregularity but only up to mild stenosis. Posterior communicating arteries are not identified and may be diminutive or absent. The right PCA is widely patent proximally, however there is new occlusion of the dominant superior P3 branch. The left PCA is patent with a new severe P1 origin stenosis as well as progressive severe left P2 stenosis. Both internal carotid arteries now demonstrate long segmental flow gaps involving the cavernous and proximal supraclinoid segments. There is a new severe stenosis at the left ICA terminus involving the ACA and MCA origins. There are also new severe proximal right A1, distal left M1, and proximal bilateral M2 stenoses. No aneurysm is identified. IMPRESSION: 1. Large acute right PCA infarct. 2. Chronic ischemia with multiple old infarcts as above. 3. Progressive intracranial atherosclerosis with multiple new severe stenoses in the anterior and posterior circulations as well as new right P3 occlusion.  Electronically Signed   By: Logan Bores M.D.   On: 12/29/2019 16:05   MR BRAIN WO CONTRAST  Result Date: 12/29/2019 CLINICAL DATA:  Peripheral vision loss in left eye. The patient is now in the emergency department. EXAM: MRI HEAD WITHOUT CONTRAST MRA HEAD WITHOUT CONTRAST TECHNIQUE: Multiplanar, multiecho pulse sequences of the brain and surrounding structures were obtained without intravenous contrast. Angiographic images of the head were obtained using MRA technique without contrast. COMPARISON:  07/13/2017 FINDINGS: MRI HEAD FINDINGS Brain: There is a large acute right PCA territory infarct involving the occipital and posterior temporal lobes with associated cytotoxic edema resulting in regional sulcal effacement and minimal mass effect on the right lateral ventricle. There are multiple chronic right cerebral infarcts in the MCA territory and border zone (acute on the prior MRI) including a moderate-sized chronic infarct in the posterior frontal and anterior parietal lobes. There is also a  small chronic infarct in the right cerebellum. Small foci of T2 hyperintensity elsewhere in the cerebral white matter bilaterally and in the pons have mildly progressed and are nonspecific but compatible with mild chronic small vessel ischemic disease. No intracranial hemorrhage, mass, midline shift, or extra-axial fluid collection is identified. There is mild cerebral atrophy. Vascular: Major intracranial vascular flow voids are preserved. Skull and upper cervical spine: Unremarkable bone marrow signal. Advanced upper cervical facet arthrosis. Susceptibility artifact at C4 related to anterior fusion. Sinuses/Orbits: Bilateral cataract extraction. Paranasal sinuses and mastoid air cells are clear. Other: None. MRA HEAD FINDINGS The visualized distal vertebral arteries are patent to the basilar and codominant. There are severe bilateral V4 stenoses which are new on the right and progressive on the left. The right PICA and  left AICA appear dominant. Patent SCAs are seen bilaterally with a chronic severe proximal stenosis noted on the right. The basilar artery is patent with diffuse irregularity but only up to mild stenosis. Posterior communicating arteries are not identified and may be diminutive or absent. The right PCA is widely patent proximally, however there is new occlusion of the dominant superior P3 branch. The left PCA is patent with a new severe P1 origin stenosis as well as progressive severe left P2 stenosis. Both internal carotid arteries now demonstrate long segmental flow gaps involving the cavernous and proximal supraclinoid segments. There is a new severe stenosis at the left ICA terminus involving the ACA and MCA origins. There are also new severe proximal right A1, distal left M1, and proximal bilateral M2 stenoses. No aneurysm is identified. IMPRESSION: 1. Large acute right PCA infarct. 2. Chronic ischemia with multiple old infarcts as above. 3. Progressive intracranial atherosclerosis with multiple new severe stenoses in the anterior and posterior circulations as well as new right P3 occlusion. Electronically Signed   By: Logan Bores M.D.   On: 12/29/2019 16:05   US Carotid Bilateral (at [facility] and [facility] only)  Result Date: 12/30/2019 CLINICAL DATA:  69 year old male with a history of stroke Given history of left carotid endarterectomy 2006 EXAM: BILATERAL CAROTID DUPLEX ULTRASOUND TECHNIQUE: Pearline Cables scale imaging, color Doppler and duplex ultrasound were performed of bilateral carotid and vertebral arteries in the neck. COMPARISON:  07/13/2017 FINDINGS: Criteria: Quantification of carotid stenosis is based on velocity parameters that correlate the residual internal carotid diameter with NASCET-based stenosis levels, using the diameter of the distal internal carotid lumen as the denominator for stenosis measurement. The following velocity measurements were obtained: RIGHT ICA:  Systolic 63 cm/sec, Diastolic 17 cm/sec  CCA:  61 cm/sec SYSTOLIC ICA/CCA RATIO:  1.0 ECA:  142 cm/sec LEFT ICA:  Systolic 64 cm/sec, Diastolic 20 cm/sec CCA:  69 cm/sec SYSTOLIC ICA/CCA RATIO:  0.9 ECA:  73 cm/sec Right Brachial SBP: Not acquired Left Brachial SBP: Not acquired RIGHT CAROTID ARTERY: No significant calcifications of the right common carotid artery. Intermediate waveform maintained. Heterogeneous and partially calcified plaque at the right carotid bifurcation. No significant lumen shadowing. Low resistance waveform of the right ICA. No significant tortuosity. RIGHT VERTEBRAL ARTERY: Antegrade flow with low resistance waveform. LEFT CAROTID ARTERY: No significant calcifications of the left common carotid artery. Intermediate waveform maintained. Patulous carotid bulb with no significant wall thickening. Flow channel maintained. Low resistance waveform of the left ICA. No significant tortuosity. LEFT VERTEBRAL ARTERY:  Antegrade flow with low resistance waveform. IMPRESSION: Right: Color duplex indicates minimal heterogeneous and calcified plaque, with no hemodynamically significant stenosis by duplex criteria in the extracranial cerebrovascular circulation. Left:  Note that established duplex criteria have not been validated in the setting of prior carotid endarterectomy, however, there is no evidence of recurrent stenosis at the left sided surgical site. Signed, Dulcy Fanny. Dellia Nims, RPVI [facility] Radiology Electronically Signed   By: Corrie Mckusick D.O.   On: 12/30/2019 10:27   ECHOCARDIOGRAM COMPLETE  Result Date: 12/30/2019    ECHOCARDIOGRAM REPORT   Patient Name:   Ryan Butler Date of Exam: 12/30/2019 Medical Rec #:  [id number]      Height:       70.0 in Accession #:    [id number]     Weight:       147.0 lb Date of Birth:  05-14-1951      BSA:          1.831 m Patient Age:    37 years       BP:           149/76 mmHg Patient Gender: M              HR:           66 bpm. Exam Location:   [facility] Procedure: 2D Echo Indications:    Stroke 434.91 / I163.9  History:        Patient has prior history of Echocardiogram examinations, most                 recent 07/13/2017. Stroke; Risk Factors:Current Smoker, Diabetes,                 Hypertension and Dyslipidemia.  Sonographer:    Leavy Cella RDCS (AE) Referring Phys: [id number] [address] [place] IMPRESSIONS  1. Left ventricular ejection fraction, by estimation, is 60 to 65%. The left ventricle has normal function. The left ventricle has no regional wall motion abnormalities. There is mild left ventricular hypertrophy. Left ventricular diastolic parameters are consistent with Grade I diastolic dysfunction (impaired relaxation). Elevated left atrial pressure.  2. Right ventricular systolic function is normal. The right ventricular size is normal. There is normal pulmonary artery systolic pressure.  3. The mitral valve is normal in structure. Trivial mitral valve regurgitation. No evidence of mitral stenosis.  4. The aortic valve is tricuspid. Aortic valve regurgitation is not visualized. No aortic stenosis is present.  5. The inferior vena cava is normal in size with greater than 50% respiratory variability, suggesting right atrial pressure of 3 mmHg. FINDINGS  Left Ventricle: Left ventricular ejection fraction, by estimation, is 60 to 65%. The left ventricle has normal function. The left ventricle has no regional wall motion abnormalities. The left ventricular internal cavity size was normal in size. There is  mild left ventricular hypertrophy. Left ventricular diastolic parameters are consistent with Grade I diastolic dysfunction (impaired relaxation). Elevated left atrial pressure. Right Ventricle: The right ventricular size is normal. No increase in right ventricular wall thickness. Right ventricular systolic function is normal. There is normal pulmonary artery systolic pressure. The tricuspid regurgitant velocity is 2.23 m/s, and  with an assumed  right atrial pressure of 10 mmHg, the estimated right ventricular systolic pressure is 61.6 mmHg. Left Atrium: Left atrial size was normal in size. Right Atrium: Right atrial size was normal in size. Pericardium: There is no evidence of pericardial effusion. Mitral Valve: The mitral valve is normal in structure. Trivial mitral valve regurgitation. No evidence of mitral valve stenosis. Tricuspid Valve: The tricuspid valve is normal in structure. Tricuspid valve regurgitation is mild . No  evidence of tricuspid stenosis. Aortic Valve: The aortic valve is tricuspid. . There is mild thickening and mild calcification of the aortic valve. Aortic valve regurgitation is not visualized. No aortic stenosis is present. Mild aortic valve annular calcification. There is mild thickening of the aortic valve. There is mild calcification of the aortic valve. Aortic valve mean gradient measures 2.5 mmHg. Aortic valve peak gradient measures 4.9 mmHg. Aortic valve area, by VTI measures 2.69 cm. Pulmonic Valve: The pulmonic valve was not well visualized. Pulmonic valve regurgitation is not visualized. No evidence of pulmonic stenosis. Aorta: The aortic root is normal in size and structure. Pulmonary Artery: Indeterminant PASP, inadequate TR jet. Venous: The inferior vena cava is normal in size with greater than 50% respiratory variability, suggesting right atrial pressure of 3 mmHg. IAS/Shunts: No atrial level shunt detected by color flow Doppler.  LEFT VENTRICLE PLAX 2D LVIDd:         3.77 cm  Diastology LVIDs:         2.62 cm  LV e' lateral:   5.98 cm/s LV PW:         1.12 cm  LV E/e' lateral: 17.2 LV IVS:        1.19 cm  LV e' medial:    6.09 cm/s LVOT diam:     2.00 cm  LV E/e' medial:  16.9 LV SV:         61 LV SV Index:   34 LVOT Area:     3.14 cm  RIGHT VENTRICLE RV S prime:     13.10 cm/s TAPSE (M-mode): 2.4 cm LEFT ATRIUM             Index       RIGHT ATRIUM           Index LA diam:        3.00 cm 1.64 cm/m  RA Area:      13.70 cm LA Vol (A2C):   39.8 ml 21.73 ml/m RA Volume:   31.10 ml  16.98 ml/m LA Vol (A4C):   39.8 ml 21.73 ml/m LA Biplane Vol: 41.1 ml 22.44 ml/m  AORTIC VALVE AV Area (Vmax):    2.43 cm AV Area (Vmean):   2.41 cm AV Area (VTI):     2.69 cm AV Vmax:           111.20 cm/s AV Vmean:          75.341 cm/s AV VTI:            0.228 m AV Peak Grad:      4.9 mmHg AV Mean Grad:      2.5 mmHg LVOT Vmax:         86.12 cm/s LVOT Vmean:        57.849 cm/s LVOT VTI:          0.196 m LVOT/AV VTI ratio: 0.86  AORTA Ao Root diam: 2.90 cm MITRAL VALVE                TRICUSPID VALVE MV Area (PHT): 3.33 cm     TR Peak grad:   19.9 mmHg MV Decel Time: 228 msec     TR Vmax:        223.00 cm/s MV E velocity: 103.00 cm/s MV A velocity: 105.00 cm/s  SHUNTS MV E/A ratio:  0.98         Systemic VTI:  0.20 m  Systemic Diam: 2.00 cm Carlyle Dolly MD Electronically signed by Carlyle Dolly MD Signature Date/Time: 12/30/2019/3:46:48 PM    Final    Scheduled Meds: . atorvastatin  40 mg Oral q1800  . clopidogrel  75 mg Oral Once  . clopidogrel  75 mg Oral Q breakfast  . heparin  5,000 Units Subcutaneous Q8H  . insulin aspart  0-15 Units Subcutaneous TID WC  . insulin aspart  0-5 Units Subcutaneous QHS  . nicotine  21 mg Transdermal Daily   Continuous Infusions:   LOS: 1 day   Time spent: 27 mins   Kennie Snedden Wynetta Emery, MD How to contact the [facility] Attending or Consulting provider [place] or covering provider during after hours [place], for this patient?  1. Check the care team in [facility] and look for a) attending/consulting [facility] provider listed and b) the [facility] team listed 2. Log into [email or web address] and use [place]'s universal password to access. If you do not have the password, please contact the hospital operator. 3. Locate the [facility] provider you are looking for under [facility] and page to a number that you can be directly reached. 4. If you still have difficulty reaching the provider,  please page the [facility] (Director on Call) for the Hospitalists listed on amion for assistance.  12/30/2019, 5:12 PM

## 2019-12-30 NOTE — Care Management Important Message (Signed)
Important Message  Patient Details  Name: Ryan Butler MRN: [id number] Date of Birth: [birth year]   Medicare Important Message Given:  Yes     Tommy Medal 12/30/2019, 3:53 PM

## 2019-12-30 NOTE — Progress Notes (Signed)
Stroke Map book given. Education given to patient and family at bedside. Teach back provided by the patient. Videos, written and verbal teaching provided with all questions answered. Patient stated his understanding.  Elodia Florence RN 12/29/2019 1130

## 2019-12-30 NOTE — Evaluation (Signed)
Physical Therapy Evaluation Patient Details Name: Ryan Butler MRN: [id number] DOB: [birth year] Today's Date: 12/30/2019   History of Present Illness  Ryan Butler  is a 69 y.o. male, with medical history significant of carotid artery disease, cervical spondylosis, history of closed head injury, essential hypertension, colon polyps, history of scarlet fever, history of seizures (last episode many years ago, not on pharmacotherapy), history of polysubstance abuse, type 2 diabetes, history of TIAs prior to carotid endarterectomy, history of CVA on Oct 07, 2016 who is brought to the emergency department for acute CVA, patient presents to ED secondary to complaints of visual changes since 7/31. MRI positive for acute CVA in right PCA region.    Clinical Impression  Patient functioning near baseline for functional mobility and gait but does ambulate with decreased gait speed with min unsteadiness. Patient with decreased light touch sensation throughout LLE but strength WFL bilaterally. Patient able to ambulate without AD with drifting L throughout ambulation secondary to visual impairment. Patient walked into objects on L 2x while ambulating. Patient required min guard for ambulation for safety/balance as well as cueing for ambulating straight and for looking L. Patient educated on looking L frequently to assess environment to decrease the risk of walking into objects/falls. Patient stating only impairment currently is with vision and L hand. Patient returned to bed at end of session. Patient discharged to care of nursing for ambulation daily as tolerated for length of stay.      Follow Up Recommendations No PT follow up;Supervision - Intermittent    Equipment Recommendations  None recommended by PT    Recommendations for Other Services       Precautions / Restrictions Precautions Precautions: Fall Restrictions Weight Bearing Restrictions: No      Mobility  Bed Mobility Overal bed mobility:  Independent                Transfers Overall transfer level: Modified independent Equipment used: None Transfers: Sit to/from [facility] Sit to Stand: Modified independent (Device/Increase time) Stand pivot transfers: Modified independent (Device/Increase time)       General transfer comment: slightly slow transfer to standing  Ambulation/Gait Ambulation/Gait assistance: Min guard Gait Distance (Feet): 125 Feet Assistive device: None Gait Pattern/deviations: Drifts right/left;Step-through pattern;Decreased step length - right;Decreased step length - left;Antalgic Gait velocity: near baseline but decreased   General Gait Details: minimally antalgic gait on LLE, drifting left throughout ambulation secondary to visual impairement, frequent cueing to look left and watch where he is going, walked into object on L 2x  Stairs            Wheelchair Mobility    Modified Rankin (Stroke Patients Only)       Balance Overall balance assessment: Needs assistance Sitting-balance support: No upper extremity supported Sitting balance-Leahy Scale: Normal Sitting balance - Comments: seated EOB   Standing balance support: No upper extremity supported Standing balance-Leahy Scale: Good Standing balance comment: good/fair without AD                             Pertinent Vitals/Pain Pain Assessment: 0-10 Pain Score: 8  Pain Location: back and head Pain Intervention(s): Limited activity within patient's tolerance;Monitored during session;Repositioned    Home Living Family/patient expects to be discharged to:: Private residence Living Arrangements: Alone Available Help at Discharge: Friend(s);Available PRN/intermittently Type of Home: Mobile home Home Access: Ramped entrance     Home Layout: One level Home Equipment: Kasandra Knudsen -  single point      Prior Function Level of Independence: Independent         Comments: pt independent in mobility,  ADLs, and driving     Hand Dominance   Dominant Hand: Right    Extremity/Trunk Assessment   Upper Extremity Assessment Upper Extremity Assessment: Defer to OT evaluation    Lower Extremity Assessment Lower Extremity Assessment: Overall WFL for tasks assessed;LLE deficits/detail LLE Sensation: decreased light touch    Cervical / Trunk Assessment Cervical / Trunk Assessment: Normal  Communication   Communication: No difficulties  Cognition Arousal/Alertness: Awake/alert Behavior During Therapy: WFL for tasks assessed/performed Overall Cognitive Status: Within Functional Limits for tasks assessed                                        General Comments      Exercises     Assessment/Plan    PT Assessment Patent does not need any further PT services  PT Problem List         PT Treatment Interventions      PT Goals (Current goals can be found in the Care Plan section)  Acute Rehab PT Goals Patient Stated Goal: Return home PT Goal Formulation: With patient Time For Goal Achievement: 12/30/19 Potential to Achieve Goals: Good    Frequency     Barriers to discharge        Co-evaluation               AM-PAC PT "6 Clicks" Mobility  Outcome Measure Help needed turning from your back to your side while in a flat bed without using bedrails?: None Help needed moving from lying on your back to sitting on the side of a flat bed without using bedrails?: None Help needed moving to and from a bed to a chair (including a wheelchair)?: None Help needed standing up from a chair using your arms (e.g., wheelchair or bedside chair)?: None Help needed to walk in hospital room?: A Little Help needed climbing 3-5 steps with a railing? : A Little 6 Click Score: 22    End of Session Equipment Utilized During Treatment: Gait belt Activity Tolerance: Patient tolerated treatment well Patient left: in bed;with family/visitor present;with call bell/phone within  reach Nurse Communication: Mobility status PT Visit Diagnosis: Unsteadiness on feet (R26.81);Other abnormalities of gait and mobility (R26.89);Muscle weakness (generalized) (M62.81)    Time: 9038-3338 PT Time Calculation (min) (ACUTE ONLY): 17 min   Charges:   PT Evaluation $PT Eval Low Complexity: 1 Low PT Treatments $Therapeutic Activity: 8-22 mins       8:52 AM, 12/30/19 Mearl Latin PT, DPT Physical Therapist at [facility]

## 2019-12-30 NOTE — Evaluation (Signed)
Occupational Therapy Evaluation Patient Details Name: Ryan Butler MRN: [id number] DOB: [birth year] Today's Date: 12/30/2019    History of Present Illness Ryan Butler  is a 69 y.o. male, with medical history significant of carotid artery disease, cervical spondylosis, history of closed head injury, essential hypertension, colon polyps, history of scarlet fever, history of seizures (last episode many years ago, not on pharmacotherapy), history of polysubstance abuse, type 2 diabetes, history of TIAs prior to carotid endarterectomy, history of CVA on Oct 07, 2016 who is brought to the emergency department for acute CVA, patient presents to ED secondary to complaints of visual changes since 7/31. MRI positive for acute CVA in right PCA region.   Clinical Impression   Pt agreeable to OT evaluation, reports vision impairment as his only symptom of most recent CVA. Pt performing ADLs and mobility tasks with cues to turn head to look to the left to identify objects in the environment and to locate items during task completion. Pt educated on homoymous hemianopsia and on compensatory strategies to implement, verbalized understanding. No further OT needs at this time.     Follow Up Recommendations  No OT follow up    Equipment Recommendations  None recommended by OT       Precautions / Restrictions Precautions Precautions: None Restrictions Weight Bearing Restrictions: No      Mobility Bed Mobility Overal bed mobility: Independent                Transfers Overall transfer level: Modified independent Equipment used: None Transfers: Sit to/from [facility] Sit to Stand: Modified independent (Device/Increase time) Stand pivot transfers: Modified independent (Device/Increase time)                ADL either performed or assessed with clinical judgement   ADL Overall ADL's : Needs assistance/impaired Eating/Feeding: Modified independent;Sitting Eating/Feeding  Details (indicate cue type and reason): cuing to look to left side of plate for locating all available items                     Toilet Transfer: Supervision/safety;Ambulation;Regular Glass blower/designer Details (indicate cue type and reason): cuing to look to left in unfamiliar environment Toileting- Clothing Manipulation and Hygiene: Supervision/safety;Sit to/from stand       Functional mobility during ADLs: Supervision/safety;Cueing for safety General ADL Comments: Pt performing ADLs without difficulty, cuing for vision deficits     Vision Baseline Vision/History: Wears glasses Wears Glasses: Reading only Patient Visual Report: Peripheral vision impairment Vision Assessment?: Yes Eye Alignment: Within Functional Limits Ocular Range of Motion: Within Functional Limits Alignment/Gaze Preference: Within Defined Limits Tracking/Visual Pursuits: Able to track stimulus in all quads without difficulty Saccades: Within functional limits Convergence: Within functional limits Visual Fields: Left homonymous hemianopsia            Pertinent Vitals/Pain Pain Assessment: No/denies pain     Hand Dominance Right   Extremity/Trunk Assessment Upper Extremity Assessment Upper Extremity Assessment: Overall WFL for tasks assessed   Lower Extremity Assessment Lower Extremity Assessment: Defer to PT evaluation   Cervical / Trunk Assessment Cervical / Trunk Assessment: Normal   Communication Communication Communication: No difficulties   Cognition Arousal/Alertness: Awake/alert Behavior During Therapy: WFL for tasks assessed/performed Overall Cognitive Status: Within Functional Limits for tasks assessed  Home Living Family/patient expects to be discharged to:: Private residence Living Arrangements: Alone Available Help at Discharge: Friend(s);Available PRN/intermittently Type of Home: Mobile home Home  Access: Ramped entrance     Home Layout: One level     Bathroom Shower/Tub: Teacher, early years/pre: Standard     Home Equipment: Cane - single point          Prior Functioning/Environment Level of Independence: Independent        Comments: pt independent in mobility, ADLs, and driving        OT Problem List: Impaired vision/perception          End of Session    Activity Tolerance: Patient tolerated treatment well Patient left: in chair;with call bell/phone within reach  OT Visit Diagnosis: Muscle weakness (generalized) (M62.81);Other (comment) (vision impairment)                Time: 7218-2883 OT Time Calculation (min): 15 min Charges:  OT General Charges $OT Visit: 1 Visit OT Evaluation $OT Eval Low Complexity: [place], OTR/L  [phone] 12/30/2019, 8:17 AM

## 2019-12-30 NOTE — Progress Notes (Signed)
Patient and family state left sided facial droop, limb ataxia are from previous CVA in 2019.  Left eye peripheral vision is lacking at this time.   Elodia Florence RN

## 2019-12-30 NOTE — Progress Notes (Signed)
*  PRELIMINARY RESULTS* Echocardiogram 2D Echocardiogram has been performed.  Leavy Cella 12/30/2019, 1:53 PM

## 2019-12-30 NOTE — Progress Notes (Signed)
SLP Cancellation Note  Patient Details Name: Ryan Butler MRN: [id number] DOB: Sep 30, 1950   Cancelled treatment:       Reason Eval/Treat Not Completed: SLP screened, no needs identified, will sign off. Pt's speech and cognition is at baseline. Thank you,  Woodroe Vogan H. Roddie Mc, CCC-SLP Speech Language Pathologist   Wende Bushy 12/30/2019, 11:57 AM

## 2019-12-31 LAB — GLUCOSE, CAPILLARY: Glucose-Capillary: 88 mg/dL (ref 70–99)

## 2019-12-31 MED ORDER — PANTOPRAZOLE SODIUM 40 MG PO TBEC
40.0000 mg | DELAYED_RELEASE_TABLET | Freq: Every day | ORAL | 2 refills | Status: DC
Start: 2019-12-31 — End: 2021-10-15

## 2019-12-31 MED ORDER — BACLOFEN 10 MG PO TABS: 10.0000 mg | ORAL_TABLET | Freq: Two times a day (BID) | ORAL | 0 refills | Status: DC | PRN

## 2019-12-31 MED ORDER — ASPIRIN EC 81 MG PO TBEC
81.0000 mg | DELAYED_RELEASE_TABLET | Freq: Every day | ORAL | 0 refills | Status: AC
Start: 2019-12-31 — End: 2020-03-30

## 2019-12-31 MED ORDER — ATORVASTATIN CALCIUM 80 MG PO TABS: 80.0000 mg | ORAL_TABLET | Freq: Every day | ORAL | 1 refills | Status: DC

## 2019-12-31 NOTE — Progress Notes (Signed)
Nsg Discharge Note  Admit Date:  12/29/2019 Discharge date: 12/31/2019   TRAMAIN GERSHMAN to be D/C'd Home per MD order.  AVS completed.  Copy for chart, and copy for patient signed, and dated. Patient/caregiver able to verbalize understanding. Gave patient stroke book and educated to call EMS if symptoms worsen. IV removed.  Discharge Medication: Allergies as of 12/31/2019      Reactions   Aspirin Nausea And Vomiting   Hydrocodone Nausea Only      Medication List    TAKE these medications   Accu-Chek Aviva Plus test strip Generic drug: glucose blood USE ONE STRIP TO CHECK GLUCOSE TWICE DAILY   Accu-Chek Softclix Lancets lancets USE ONE LANCET TWICE DAILY AS DIRECTED TO CHECK BLOOD GLUCOSE   amitriptyline 150 MG tablet Commonly known as: ELAVIL TAKE ONE TABLET BY MOUTH AT BEDTIME AS NEEDED FOR SLEEP What changed:   when to take this  additional instructions   aspirin EC 81 MG tablet Take 1 tablet (81 mg total) by mouth daily. Swallow whole.  Take with Plavix for 90 days.   atorvastatin 80 MG tablet Commonly known as: LIPITOR Take 1 tablet (80 mg total) by mouth daily at 6 PM. What changed:   medication strength  how much to take   baclofen 10 MG tablet Commonly known as: LIORESAL Take 1 tablet (10 mg total) by mouth 2 (two) times daily as needed for muscle spasms. What changed:   when to take this  reasons to take this   clopidogrel 75 MG tablet Commonly known as: PLAVIX Take 1 tablet (75 mg total) by mouth daily with breakfast.   hydrochlorothiazide 12.5 MG tablet Commonly known as: HYDRODIURIL Take 12.5 mg by mouth daily.   losartan 100 MG tablet Commonly known as: COZAAR Take 100 mg by mouth daily.   metFORMIN 500 MG 24 hr tablet Commonly known as: GLUCOPHAGE-XR Take 1 tablet (500 mg total) by mouth daily with breakfast.   multivitamin with minerals tablet Take 1 tablet by mouth daily.   Oxycodone HCl 10 MG Tabs Take 10 mg by mouth in the morning,  at noon, in the evening, and at bedtime.   pantoprazole 40 MG tablet Commonly known as: Protonix Take 1 tablet (40 mg total) by mouth daily.       Discharge Assessment: Vitals:   12/31/19 0548 12/31/19 1015  BP: 138/67 134/74  Pulse: 74 72  Resp: 16 18  Temp: (!) 97.4 F (36.3 C) 98.2 F (36.8 C)  SpO2: 99% 98%   Skin clean, dry and intact without evidence of skin break down, no evidence of skin tears noted. IV catheter discontinued intact. Site without signs and symptoms of complications - no redness or edema noted at insertion site, patient denies c/o pain - only slight tenderness at site.  Dressing with slight pressure applied.  D/c Instructions-Education: Discharge instructions given to patient/family with verbalized understanding. D/c education completed with patient/family including follow up instructions, medication list, d/c activities limitations if indicated, with other d/c instructions as indicated by MD - patient able to verbalize understanding, all questions fully answered. Patient instructed to return to ED, call 911, or call MD for any changes in condition.  Patient escorted via [place], and D/C home via private auto.  Zenaida Deed, RN 12/31/2019 11:17 AM

## 2019-12-31 NOTE — Discharge Summary (Signed)
Physician Discharge Summary  [name] STM:[id number] DOB: 09-Oct-1950 DOA: 12/29/2019  PCP: Celene Squibb, MD  Admit date: 12/29/2019 Discharge date: 12/31/2019  Admitted From:  Home  Disposition:  Home   Recommendations for Outpatient Follow-up:  1. Follow up with PCP in 1 weeks 2. Follow up with [facility] in 1-2 weeks (referral order made) 3. STRONGLY ADVISED SMOKING CESSATION   Discharge Condition: STABLE   CODE STATUS: FULL    Brief Hospitalization Summary: Please see all hospital notes, images, labs for full details of the hospitalization. ADMISSION HPI:  Ryan Butler  is a 69 y.o. male, with medical history significant ofcarotid artery disease, cervical spondylosis, history of closed head injury, essential hypertension, colon polyps, history of scarlet fever, history of seizures (last episode many years ago, not on pharmacotherapy), history of polysubstance abuse, type 2 diabetes, history of TIAsprior to carotid endarterectomy, history of CVA on May 15, 2018who is brought to the emergency department for acute CVA, patient reported history of stroke in the past affecting his left arm and leg, patient presents to ED secondary to complaints of visual changes since 7/31, he came to ED once before, but left before being seen, symptoms worsened, but he is unable to see in the left side at all, he has been living alone and getting along pretty well despite having previous CVAs, so his PCP yesterday, who sent him to the ophthalmologist, who was sent in for MRI of the brain, patient was sent further to ED given concerns that the MRI showing acute CVA, she denies any slurred speech, focal deficits beside his vision issue, no fever, no chills, no nausea or vomiting, and received Plavix in ED given his aspirin and allergy, and [facility] hospitalist consulted to admit.  Assessment & Plan:   Active Problems:   Mixed hyperlipidemia   TOBACCO ABUSE   Essential hypertension, benign    CAROTID ENDARTERECTOMY, LEFT, HX OF   Chronic pain disorder   CVA (cerebral vascular accident) ([place])   Type 2 diabetes mellitus ([place])   Acute CVA (cerebrovascular accident) ([place])  1. Acute CVA - MRI with findings of large acute right PCA territory infarct in addition to chronic ischemia and vascular stenosis.  He remains on plavix.  He reports allergy to aspirin. Continue full stroke workup. Neurology consultation requested but not available until Monday.  Pt says he wants to follow up with his outpatient neurologist at [facility].  He won't stay in hospital any longer.  He has reported allergy to aspirin.  I have asked him to take a coated 81 mg aspirin for 3 months in addition to his plavix and follow up with his neurologist.  protonix ordered for GI protection.  He verbalized understanding.  2. HTN - resume home meds.  3. Type 2 DM - continue SSI coverage, check A1c.  4. Hyperlipidemia - increase atorvastatin to 80 mg daily.  5. Tobacco use - STRONGLY ADVISED PATIENT TO QUIT NOW.  HE REFUSES NICOTINE PATCH.  6. Chronic pain / opioid dependence - resumed home meds.  7. History of carotid stenosis s/p left CEA -  carotid dopplers no significant stenosis, continue plavix, aspirin and atorvastatin.  Increased atorvastatin to 80 mg.   8. Lost vision left eye - Pt reports lost peripheral vision left eye.    DVT prophylaxis: SCDs Code Status: Full  Family Communication: daughter at bedside  Disposition:  Home    Discharge Diagnoses:  Active Problems:   Mixed hyperlipidemia   TOBACCO  ABUSE   Essential hypertension, benign   CAROTID ENDARTERECTOMY, LEFT, HX OF   Chronic pain disorder   CVA (cerebral vascular accident) ([place])   Type 2 diabetes mellitus ([place])   Acute CVA (cerebrovascular accident) [name])   Discharge Instructions: Discharge Instructions    Ambulatory referral to Neurology   Complete by: As directed    An appointment is requested in approximately: 1-2 weeks      Allergies as of 12/31/2019      Reactions   Aspirin Nausea And Vomiting   Hydrocodone Nausea Only      Medication List    TAKE these medications   Accu-Chek Aviva Plus test strip Generic drug: glucose blood USE ONE STRIP TO CHECK GLUCOSE TWICE DAILY   Accu-Chek Softclix Lancets lancets USE ONE LANCET TWICE DAILY AS DIRECTED TO CHECK BLOOD GLUCOSE   amitriptyline 150 MG tablet Commonly known as: ELAVIL TAKE ONE TABLET BY MOUTH AT BEDTIME AS NEEDED FOR SLEEP What changed:   when to take this  additional instructions   aspirin EC 81 MG tablet Take 1 tablet (81 mg total) by mouth daily. Swallow whole.  Take with Plavix for 90 days.   atorvastatin 80 MG tablet Commonly known as: LIPITOR Take 1 tablet (80 mg total) by mouth daily at 6 PM. What changed:   medication strength  how much to take   baclofen 10 MG tablet Commonly known as: LIORESAL Take 1 tablet (10 mg total) by mouth 2 (two) times daily as needed for muscle spasms. What changed:   when to take this  reasons to take this   clopidogrel 75 MG tablet Commonly known as: PLAVIX Take 1 tablet (75 mg total) by mouth daily with breakfast.   hydrochlorothiazide 12.5 MG tablet Commonly known as: HYDRODIURIL Take 12.5 mg by mouth daily.   losartan 100 MG tablet Commonly known as: COZAAR Take 100 mg by mouth daily.   metFORMIN 500 MG 24 hr tablet Commonly known as: GLUCOPHAGE-XR Take 1 tablet (500 mg total) by mouth daily with breakfast.   multivitamin with minerals tablet Take 1 tablet by mouth daily.   Oxycodone HCl 10 MG Tabs Take 10 mg by mouth in the morning, at noon, in the evening, and at bedtime.   pantoprazole 40 MG tablet Commonly known as: Protonix Take 1 tablet (40 mg total) by mouth daily.       Follow-up Information    Celene Squibb, MD. Schedule an appointment as soon as possible for a visit in 1 week(s).   Specialty: Internal Medicine Contact information: [place] [address] [phone]        [place]. Schedule an appointment as soon as possible for a visit in 2 week(s).   Contact information: [address]     [address] [place] [address] [phone]             Allergies  Allergen Reactions  . Aspirin Nausea And Vomiting  . Hydrocodone Nausea Only   Allergies as of 12/31/2019      Reactions   Aspirin Nausea And Vomiting   Hydrocodone Nausea Only      Medication List    TAKE these medications   Accu-Chek Aviva Plus test strip Generic drug: glucose blood USE ONE STRIP TO CHECK GLUCOSE TWICE DAILY   Accu-Chek Softclix Lancets lancets USE ONE LANCET TWICE DAILY AS DIRECTED TO CHECK BLOOD GLUCOSE   amitriptyline 150 MG tablet Commonly known as: ELAVIL TAKE  ONE TABLET BY MOUTH AT BEDTIME AS NEEDED FOR SLEEP What changed:   when to take this  additional instructions   aspirin EC 81 MG tablet Take 1 tablet (81 mg total) by mouth daily. Swallow whole.  Take with Plavix for 90 days.   atorvastatin 80 MG tablet Commonly known as: LIPITOR Take 1 tablet (80 mg total) by mouth daily at 6 PM. What changed:   medication strength  how much to take   baclofen 10 MG tablet Commonly known as: LIORESAL Take 1 tablet (10 mg total) by mouth 2 (two) times daily as needed for muscle spasms. What changed:   when to take this  reasons to take this   clopidogrel 75 MG tablet Commonly known as: PLAVIX Take 1 tablet (75 mg total) by mouth daily with breakfast.   hydrochlorothiazide 12.5 MG tablet Commonly known as: HYDRODIURIL Take 12.5 mg by mouth daily.   losartan 100 MG tablet Commonly known as: COZAAR Take 100 mg by mouth daily.   metFORMIN 500 MG 24 hr tablet Commonly known as: GLUCOPHAGE-XR Take 1 tablet (500 mg total) by mouth daily with breakfast.   multivitamin with minerals tablet Take 1 tablet by mouth daily.   Oxycodone HCl 10 MG Tabs Take 10 mg by  mouth in the morning, at noon, in the evening, and at bedtime.   pantoprazole 40 MG tablet Commonly known as: Protonix Take 1 tablet (40 mg total) by mouth daily.       Procedures/Studies: MR ANGIO HEAD WO CONTRAST  Result Date: 12/29/2019 CLINICAL DATA:  Peripheral vision loss in left eye. The patient is now in the emergency department. EXAM: MRI HEAD WITHOUT CONTRAST MRA HEAD WITHOUT CONTRAST TECHNIQUE: Multiplanar, multiecho pulse sequences of the brain and surrounding structures were obtained without intravenous contrast. Angiographic images of the head were obtained using MRA technique without contrast. COMPARISON:  07/13/2017 FINDINGS: MRI HEAD FINDINGS Brain: There is a large acute right PCA territory infarct involving the occipital and posterior temporal lobes with associated cytotoxic edema resulting in regional sulcal effacement and minimal mass effect on the right lateral ventricle. There are multiple chronic right cerebral infarcts in the MCA territory and border zone (acute on the prior MRI) including a moderate-sized chronic infarct in the posterior frontal and anterior parietal lobes. There is also a small chronic infarct in the right cerebellum. Small foci of T2 hyperintensity elsewhere in the cerebral white matter bilaterally and in the pons have mildly progressed and are nonspecific but compatible with mild chronic small vessel ischemic disease. No intracranial hemorrhage, mass, midline shift, or extra-axial fluid collection is identified. There is mild cerebral atrophy. Vascular: Major intracranial vascular flow voids are preserved. Skull and upper cervical spine: Unremarkable bone marrow signal. Advanced upper cervical facet arthrosis. Susceptibility artifact at C4 related to anterior fusion. Sinuses/Orbits: Bilateral cataract extraction. Paranasal sinuses and mastoid air cells are clear. Other: None. MRA HEAD FINDINGS The visualized distal vertebral arteries are patent to the basilar  and codominant. There are severe bilateral V4 stenoses which are new on the right and progressive on the left. The right PICA and left AICA appear dominant. Patent SCAs are seen bilaterally with a chronic severe proximal stenosis noted on the right. The basilar artery is patent with diffuse irregularity but only up to mild stenosis. Posterior communicating arteries are not identified and may be diminutive or absent. The right PCA is widely patent proximally, however there is new occlusion of the dominant superior P3 branch. The left PCA  is patent with a new severe P1 origin stenosis as well as progressive severe left P2 stenosis. Both internal carotid arteries now demonstrate long segmental flow gaps involving the cavernous and proximal supraclinoid segments. There is a new severe stenosis at the left ICA terminus involving the ACA and MCA origins. There are also new severe proximal right A1, distal left M1, and proximal bilateral M2 stenoses. No aneurysm is identified. IMPRESSION: 1. Large acute right PCA infarct. 2. Chronic ischemia with multiple old infarcts as above. 3. Progressive intracranial atherosclerosis with multiple new severe stenoses in the anterior and posterior circulations as well as new right P3 occlusion. Electronically Signed   By: Logan Bores M.D.   On: 12/29/2019 16:05   MR BRAIN WO CONTRAST  Result Date: 12/29/2019 CLINICAL DATA:  Peripheral vision loss in left eye. The patient is now in the emergency department. EXAM: MRI HEAD WITHOUT CONTRAST MRA HEAD WITHOUT CONTRAST TECHNIQUE: Multiplanar, multiecho pulse sequences of the brain and surrounding structures were obtained without intravenous contrast. Angiographic images of the head were obtained using MRA technique without contrast. COMPARISON:  07/13/2017 FINDINGS: MRI HEAD FINDINGS Brain: There is a large acute right PCA territory infarct involving the occipital and posterior temporal lobes with associated cytotoxic edema resulting in  regional sulcal effacement and minimal mass effect on the right lateral ventricle. There are multiple chronic right cerebral infarcts in the MCA territory and border zone (acute on the prior MRI) including a moderate-sized chronic infarct in the posterior frontal and anterior parietal lobes. There is also a small chronic infarct in the right cerebellum. Small foci of T2 hyperintensity elsewhere in the cerebral white matter bilaterally and in the pons have mildly progressed and are nonspecific but compatible with mild chronic small vessel ischemic disease. No intracranial hemorrhage, mass, midline shift, or extra-axial fluid collection is identified. There is mild cerebral atrophy. Vascular: Major intracranial vascular flow voids are preserved. Skull and upper cervical spine: Unremarkable bone marrow signal. Advanced upper cervical facet arthrosis. Susceptibility artifact at C4 related to anterior fusion. Sinuses/Orbits: Bilateral cataract extraction. Paranasal sinuses and mastoid air cells are clear. Other: None. MRA HEAD FINDINGS The visualized distal vertebral arteries are patent to the basilar and codominant. There are severe bilateral V4 stenoses which are new on the right and progressive on the left. The right PICA and left AICA appear dominant. Patent SCAs are seen bilaterally with a chronic severe proximal stenosis noted on the right. The basilar artery is patent with diffuse irregularity but only up to mild stenosis. Posterior communicating arteries are not identified and may be diminutive or absent. The right PCA is widely patent proximally, however there is new occlusion of the dominant superior P3 branch. The left PCA is patent with a new severe P1 origin stenosis as well as progressive severe left P2 stenosis. Both internal carotid arteries now demonstrate long segmental flow gaps involving the cavernous and proximal supraclinoid segments. There is a new severe stenosis at the left ICA terminus involving  the ACA and MCA origins. There are also new severe proximal right A1, distal left M1, and proximal bilateral M2 stenoses. No aneurysm is identified. IMPRESSION: 1. Large acute right PCA infarct. 2. Chronic ischemia with multiple old infarcts as above. 3. Progressive intracranial atherosclerosis with multiple new severe stenoses in the anterior and posterior circulations as well as new right P3 occlusion. Electronically Signed   By: Logan Bores M.D.   On: 12/29/2019 16:05   US Carotid Bilateral (at [facility] and [facility]  only)  Result Date: 12/30/2019 CLINICAL DATA:  69 year old male with a history of stroke Given history of left carotid endarterectomy 2006 EXAM: BILATERAL CAROTID DUPLEX ULTRASOUND TECHNIQUE: Pearline Cables scale imaging, color Doppler and duplex ultrasound were performed of bilateral carotid and vertebral arteries in the neck. COMPARISON:  07/13/2017 FINDINGS: Criteria: Quantification of carotid stenosis is based on velocity parameters that correlate the residual internal carotid diameter with NASCET-based stenosis levels, using the diameter of the distal internal carotid lumen as the denominator for stenosis measurement. The following velocity measurements were obtained: RIGHT ICA:  Systolic 63 cm/sec, Diastolic 17 cm/sec CCA:  61 cm/sec SYSTOLIC ICA/CCA RATIO:  1.0 ECA:  142 cm/sec LEFT ICA:  Systolic 64 cm/sec, Diastolic 20 cm/sec CCA:  69 cm/sec SYSTOLIC ICA/CCA RATIO:  0.9 ECA:  73 cm/sec Right Brachial SBP: Not acquired Left Brachial SBP: Not acquired RIGHT CAROTID ARTERY: No significant calcifications of the right common carotid artery. Intermediate waveform maintained. Heterogeneous and partially calcified plaque at the right carotid bifurcation. No significant lumen shadowing. Low resistance waveform of the right ICA. No significant tortuosity. RIGHT VERTEBRAL ARTERY: Antegrade flow with low resistance waveform. LEFT CAROTID ARTERY: No significant calcifications of the left common carotid artery.  Intermediate waveform maintained. Patulous carotid bulb with no significant wall thickening. Flow channel maintained. Low resistance waveform of the left ICA. No significant tortuosity. LEFT VERTEBRAL ARTERY:  Antegrade flow with low resistance waveform. IMPRESSION: Right: Color duplex indicates minimal heterogeneous and calcified plaque, with no hemodynamically significant stenosis by duplex criteria in the extracranial cerebrovascular circulation. Left: Note that established duplex criteria have not been validated in the setting of prior carotid endarterectomy, however, there is no evidence of recurrent stenosis at the left sided surgical site. Signed, Dulcy Fanny. Dellia Nims, RPVI [facility] Electronically Signed   By: Corrie Mckusick D.O.   On: 12/30/2019 10:27   ECHOCARDIOGRAM COMPLETE  Result Date: 12/30/2019    ECHOCARDIOGRAM REPORT   Patient Name:   SI JACHIM Date of Exam: 12/30/2019 Medical Rec #:  [id number]      Height:       70.0 in Accession #:    [id number]     Weight:       147.0 lb Date of Birth:  March 19, 1951      BSA:          1.831 m Patient Age:    69 years       BP:           149/76 mmHg Patient Gender: M              HR:           66 bpm. Exam Location:  [facility] Procedure: 2D Echo Indications:    Stroke 434.91 / I163.9  History:        Patient has prior history of Echocardiogram examinations, most                 recent 07/13/2017. Stroke; Risk Factors:Current Smoker, Diabetes,                 Hypertension and Dyslipidemia.  Sonographer:    Leavy Cella RDCS (AE) Referring Phys: [id number] DAWOOD S ELGER[place]WY IMPRESSIONS  1. Left ventricular ejection fraction, by estimation, is 60 to 65%. The left ventricle has normal function. The left ventricle has no regional wall motion abnormalities. There is mild left ventricular hypertrophy. Left ventricular diastolic parameters are consistent with Grade I diastolic dysfunction (impaired  relaxation).  Elevated left atrial pressure.  2. Right ventricular systolic function is normal. The right ventricular size is normal. There is normal pulmonary artery systolic pressure.  3. The mitral valve is normal in structure. Trivial mitral valve regurgitation. No evidence of mitral stenosis.  4. The aortic valve is tricuspid. Aortic valve regurgitation is not visualized. No aortic stenosis is present.  5. The inferior vena cava is normal in size with greater than 50% respiratory variability, suggesting right atrial pressure of 3 mmHg. FINDINGS  Left Ventricle: Left ventricular ejection fraction, by estimation, is 60 to 65%. The left ventricle has normal function. The left ventricle has no regional wall motion abnormalities. The left ventricular internal cavity size was normal in size. There is  mild left ventricular hypertrophy. Left ventricular diastolic parameters are consistent with Grade I diastolic dysfunction (impaired relaxation). Elevated left atrial pressure. Right Ventricle: The right ventricular size is normal. No increase in right ventricular wall thickness. Right ventricular systolic function is normal. There is normal pulmonary artery systolic pressure. The tricuspid regurgitant velocity is 2.23 m/s, and  with an assumed right atrial pressure of 10 mmHg, the estimated right ventricular systolic pressure is 45.8 mmHg. Left Atrium: Left atrial size was normal in size. Right Atrium: Right atrial size was normal in size. Pericardium: There is no evidence of pericardial effusion. Mitral Valve: The mitral valve is normal in structure. Trivial mitral valve regurgitation. No evidence of mitral valve stenosis. Tricuspid Valve: The tricuspid valve is normal in structure. Tricuspid valve regurgitation is mild . No evidence of tricuspid stenosis. Aortic Valve: The aortic valve is tricuspid. . There is mild thickening and mild calcification of the aortic valve. Aortic valve regurgitation is not visualized. No aortic  stenosis is present. Mild aortic valve annular calcification. There is mild thickening of the aortic valve. There is mild calcification of the aortic valve. Aortic valve mean gradient measures 2.5 mmHg. Aortic valve peak gradient measures 4.9 mmHg. Aortic valve area, by VTI measures 2.69 cm. Pulmonic Valve: The pulmonic valve was not well visualized. Pulmonic valve regurgitation is not visualized. No evidence of pulmonic stenosis. Aorta: The aortic root is normal in size and structure. Pulmonary Artery: Indeterminant PASP, inadequate TR jet. Venous: The inferior vena cava is normal in size with greater than 50% respiratory variability, suggesting right atrial pressure of 3 mmHg. IAS/Shunts: No atrial level shunt detected by color flow Doppler.  LEFT VENTRICLE PLAX 2D LVIDd:         3.77 cm  Diastology LVIDs:         2.62 cm  LV e' lateral:   5.98 cm/s LV PW:         1.12 cm  LV E/e' lateral: 17.2 LV IVS:        1.19 cm  LV e' medial:    6.09 cm/s LVOT diam:     2.00 cm  LV E/e' medial:  16.9 LV SV:         61 LV SV Index:   34 LVOT Area:     3.14 cm  RIGHT VENTRICLE RV S prime:     13.10 cm/s TAPSE (M-mode): 2.4 cm LEFT ATRIUM             Index       RIGHT ATRIUM           Index LA diam:        3.00 cm 1.64 cm/m  RA Area:     13.70 cm LA Vol (A2C):  39.8 ml 21.73 ml/m RA Volume:   31.10 ml  16.98 ml/m LA Vol (A4C):   39.8 ml 21.73 ml/m LA Biplane Vol: 41.1 ml 22.44 ml/m  AORTIC VALVE AV Area (Vmax):    2.43 cm AV Area (Vmean):   2.41 cm AV Area (VTI):     2.69 cm AV Vmax:           111.20 cm/s AV Vmean:          75.341 cm/s AV VTI:            0.228 m AV Peak Grad:      4.9 mmHg AV Mean Grad:      2.5 mmHg LVOT Vmax:         86.12 cm/s LVOT Vmean:        57.849 cm/s LVOT VTI:          0.196 m LVOT/AV VTI ratio: 0.86  AORTA Ao Root diam: 2.90 cm MITRAL VALVE                TRICUSPID VALVE MV Area (PHT): 3.33 cm     TR Peak grad:   19.9 mmHg MV Decel Time: 228 msec     TR Vmax:        223.00 cm/s MV E  velocity: 103.00 cm/s MV A velocity: 105.00 cm/s  SHUNTS MV E/A ratio:  0.98         Systemic VTI:  0.20 m                             Systemic Diam: 2.00 cm Carlyle Dolly MD Electronically signed by Carlyle Dolly MD Signature Date/Time: 12/30/2019/3:46:48 PM    Final      Subjective: Pt says he wants to go home, not wanting to wait another day to see neurology.  He discussed with Dr. Nevada Crane his PCP and he wants to be referred [facility] to [facility] neurology.  He has gone there before for treatment.  Pt says he is leaving hospital today.  Daughter at bedside and she will care for him at home.  Pt still has loss of left eye peripheral vision.    Discharge Exam: Vitals:   12/31/19 0200 12/31/19 0548  BP: (!) 160/76 138/67  Pulse: 79 74  Resp: 16 16  Temp: 98.6 F (37 C) (!) 97.4 F (36.3 C)  SpO2: 99% 99%   Vitals:   12/30/19 2035 12/31/19 0006 12/31/19 0200 12/31/19 0548  BP: 140/72 (!) 177/79 (!) 160/76 138/67  Pulse: 71 82 79 74  Resp: $Remo'16 16 16 16  'ijMuB$ Temp: 98.2 F (36.8 C) 98.7 F (37.1 C) 98.6 F (37 C) (!) 97.4 F (36.3 C)  TempSrc:   Oral   SpO2: 99% 100% 99% 99%   General: Pt is alert, awake, not in acute distress Cardiovascular: normal S1/S2 +, no rubs, no gallops Respiratory: CTA bilaterally, no wheezing, no rhonchi Abdominal: Soft, NT, ND, bowel sounds + Extremities: no edema, no cyanosis Neurological: nonfocal exam;    The results of significant diagnostics from this hospitalization (including imaging, microbiology, ancillary and laboratory) are listed below for reference.     Microbiology: Recent Results (from the past 240 hour(s))  SARS Coronavirus 2 by RT PCR (hospital order, performed in [facility] hospital lab) Nasopharyngeal Nasopharyngeal Swab     Status: None   Collection Time: 12/30/19  3:32 AM   Specimen: Nasopharyngeal Swab  Result Value Ref Range Status  SARS Coronavirus 2 NEGATIVE NEGATIVE Final    Comment: (NOTE) SARS-CoV-2 target nucleic acids are  NOT DETECTED.  The SARS-CoV-2 RNA is generally detectable in upper and lower respiratory specimens during the acute phase of infection. The lowest concentration of SARS-CoV-2 viral copies this assay can detect is 250 copies / mL. A negative result does not preclude SARS-CoV-2 infection and should not be used as the sole basis for treatment or other patient management decisions.  A negative result may occur with improper specimen collection / handling, submission of specimen other than nasopharyngeal swab, presence of viral mutation(s) within the areas targeted by this assay, and inadequate number of viral copies (<250 copies / mL). A negative result must be combined with clinical observations, patient history, and epidemiological information.  Fact Sheet for Patients:   StrictlyIdeas.no  Fact Sheet for Healthcare Providers: BankingDealers.co.za  This test is not yet approved or  cleared by the [place] FDA and has been authorized for detection and/or diagnosis of SARS-CoV-2 by FDA under an Emergency Use Authorization (EUA).  This EUA will remain in effect (meaning this test can be used) for the duration of the COVID-19 declaration under Section 564(b)(1) of the Act, 21 U.S.C. section 360bbb-3(b)(1), unless the authorization is terminated or revoked sooner.  Performed at [facility], [address], [place], [place] [address]      Labs: BNP (last 3 results) No results for input(s): BNP in the last 8760 hours. Basic Metabolic Panel: Recent Labs  Lab 12/29/19 1615  NA 136  K 3.5  CL 101  CO2 27  GLUCOSE 83  BUN 16  CREATININE 1.11  CALCIUM 9.4   Liver Function Tests: Recent Labs  Lab 12/29/19 1615  AST 21  ALT 24  ALKPHOS 98  BILITOT 0.5  PROT 7.5  ALBUMIN 4.4   No results for input(s): LIPASE, AMYLASE in the last 168 hours. No results for input(s): AMMONIA in the last 168 hours. CBC: Recent Labs  Lab  12/29/19 1615  WBC 5.7  NEUTROABS 2.7  HGB 11.5*  HCT 34.9*  MCV 96.1  PLT 141*   Cardiac Enzymes: No results for input(s): CKTOTAL, CKMB, CKMBINDEX, TROPONINI in the last 168 hours. BNP: Invalid input(s): POCBNP CBG: Recent Labs  Lab 12/30/19 0738 12/30/19 1154 12/30/19 1606 12/30/19 2032 12/31/19 0804  GLUCAP 88 113* 83 91 88   D-Dimer No results for input(s): DDIMER in the last 72 hours. Hgb A1c Recent Labs    12/29/19 1615  HGBA1C 6.2*   Lipid Profile Recent Labs    12/30/19 0556  CHOL 95  HDL 41  LDLCALC 40  TRIG 70  CHOLHDL 2.3   Thyroid function studies No results for input(s): TSH, T4TOTAL, T3FREE, THYROIDAB in the last 72 hours.  Invalid input(s): FREET3 Anemia work up No results for input(s): VITAMINB12, FOLATE, FERRITIN, TIBC, IRON, RETICCTPCT in the last 72 hours. Urinalysis    Component Value Date/Time   COLORURINE STRAW (A) 01/07/2017 0944   APPEARANCEUR CLEAR 01/07/2017 0944   LABSPEC 1.006 01/07/2017 0944   PHURINE 6.0 01/07/2017 0944   GLUCOSEU 50 (A) 01/07/2017 0944   HGBUR NEGATIVE 01/07/2017 [place] 01/07/2017 0944   KETONESUR NEGATIVE 01/07/2017 0944   PROTEINUR NEGATIVE 01/07/2017 0944   NITRITE NEGATIVE 01/07/2017 0944   LEUKOCYTESUR NEGATIVE 01/07/2017 0944   Sepsis Labs Invalid input(s): PROCALCITONIN,  WBC,  LACTICIDVEN Microbiology Recent Results (from the past 240 hour(s))  SARS Coronavirus 2 by RT PCR (hospital order, performed in  [facility] hospital lab) Nasopharyngeal Nasopharyngeal Swab     Status: None   Collection Time: 12/30/19  3:32 AM   Specimen: Nasopharyngeal Swab  Result Value Ref Range Status   SARS Coronavirus 2 NEGATIVE NEGATIVE Final    Comment: (NOTE) SARS-CoV-2 target nucleic acids are NOT DETECTED.  The SARS-CoV-2 RNA is generally detectable in upper and lower respiratory specimens during the acute phase of infection. The lowest concentration of SARS-CoV-2 viral copies this  assay can detect is 250 copies / mL. A negative result does not preclude SARS-CoV-2 infection and should not be used as the sole basis for treatment or other patient management decisions.  A negative result may occur with improper specimen collection / handling, submission of specimen other than nasopharyngeal swab, presence of viral mutation(s) within the areas targeted by this assay, and inadequate number of viral copies (<250 copies / mL). A negative result must be combined with clinical observations, patient history, and epidemiological information.  Fact Sheet for Patients:   StrictlyIdeas.no  Fact Sheet for Healthcare Providers: BankingDealers.co.za  This test is not yet approved or  cleared by the [place] FDA and has been authorized for detection and/or diagnosis of SARS-CoV-2 by FDA under an Emergency Use Authorization (EUA).  This EUA will remain in effect (meaning this test can be used) for the duration of the COVID-19 declaration under Section 564(b)(1) of the Act, 21 U.S.C. section 360bbb-3(b)(1), unless the authorization is terminated or revoked sooner.  Performed at [facility], [address] [place], [place] [address]    Time coordinating discharge: 34 minutes   SIGNED:  Irwin Brakeman, MD  [facility] 12/31/2019, 10:34 AM How to contact the [facility] Attending or Consulting provider [place] or covering provider during after hours [place], for this patient?  1. Check the care team in [facility] and look for a) attending/consulting [facility] provider listed and b) the [facility] team listed 2. Log into [email or web address] and use [place]'s universal password to access. If you do not have the password, please contact the hospital operator. 3. Locate the [facility] provider you are looking for under [facility] and page to a number that you can be directly reached. 4. If you still have difficulty reaching the provider, please page the  [facility] (Director on Call) for the Hospitalists listed on amion for assistance.

## 2019-12-31 NOTE — Discharge Instructions (Signed)
Please take coated baby aspirin 81 mg daily for 90 days with plavix for stroke prevention.   Take protonix daily to protect your stomach from the aspirin.  Please see your neurologist in next 1-2 weeks.    Hospital Discharge After a Stroke  Being discharged from the hospital after a stroke can feel overwhelming. Many things may be different, and it is normal to feel scared or anxious. Some stroke survivors may be able to return to their homes, and others may need more specialized care on a temporary or permanent basis. Your stroke care team will work with you to develop a discharge plan that is best for you. Ask questions if you do not understand something. Invite a friend or family member to participate in discharge planning. Understanding and following your discharge plan can help to prevent another stroke or other problems. Understanding your medicines After a stroke, your health care provider may prescribe one or more types of medicine. It is important to take medicines exactly as told by your health care provider. Serious harm, such as another stroke, can happen if you are unable to take your medicine exactly as prescribed. Make sure you understand:  What medicine to take.  Why you are taking the medicine.  How and when to take it.  If it can be taken with your other medicines and herbal supplements.  Possible side effects.  When to call your health care provider if you have any side effects.  How you will get and pay for your medicines. Medical assistance programs may be able to help you pay for prescription medicines if you cannot afford them. If you are taking an anticoagulant, be sure to take it exactly as told by your health care provider. This type of medicine can increase the risk of bleeding because it works to prevent blood from clotting. You may need to take certain precautions to prevent bleeding. You should contact your health care provider if you have:  Bleeding or  bruising.  A fall or other injury to your head.  Blood in your urine or stool (feces). Planning for home safety  Take steps to prevent falls, such as installing grab bars or using a shower chair. Ask a friend or family member to get needed things in place before you go home if possible. A therapist can come to your home to make recommendations for safety equipment. Ask your health care provider if you would benefit from this service or from home care. Getting needed equipment Ask your health care provider for a list of any medical equipment and supplies you will need at home. These may include items such as:  Walkers.  Canes.  Wheelchairs.  Hand-strengthening devices.  Special eating utensils. Medical equipment can be rented or purchased, depending on your insurance coverage. Check with your insurance company about what is covered. Keeping follow-up visits After a stroke, you will need to follow up regularly with a health care provider. You may also need rehabilitation, which can include physical therapy, occupational therapy, or speech-language therapy. Keeping these appointments is very important to your recovery after a stroke. Be sure to bring your medicine list and discharge papers with you to your appointments. If you need help to keep track of your schedule, use a calendar or appointment reminder. Preventing another stroke Having a stroke puts you at risk for another stroke in the future. Ask your health care provider what actions you can take to lower the risk. These may include:  Increasing how  much you exercise.  Making a healthy eating plan.  Quitting smoking.  Managing other health conditions, such as high blood pressure, high cholesterol, or diabetes.  Limiting alcohol use. Knowing the warning signs of a stroke  Make sure you understand the signs of a stroke. Before you leave the hospital, you will receive information outlining the stroke warning signs. Share these  with your friends and family members. "BE FAST" is an easy way to remember the main warning signs of a stroke:  B - Balance. Signs are dizziness, sudden trouble walking, or loss of balance.  E - Eyes. Signs are trouble seeing or a sudden change in vision.  F - Face. Signs are sudden weakness or numbness of the face, or the face or eyelid drooping on one side.  A - Arms. Signs are weakness or numbness in an arm. This happens suddenly and usually on one side of the body.  S - Speech. Signs are sudden trouble speaking, slurred speech, or trouble understanding what people say.  T - Time. Time to call emergency services. Write down what time symptoms started. Other signs of stroke may include:  A sudden, severe headache with no known cause.  Nausea or vomiting.  Seizure. These symptoms may represent a serious problem that is an emergency. Do not wait to see if the symptoms will go away. Get medical help right away. Call your local emergency services (911 in the [place] Do not drive yourself to the hospital. Make note of the time that you had your first symptoms. Your emergency responders or emergency room staff will need to know this information. Summary  Being discharged from the hospital after a stroke can feel overwhelming. It is normal to feel scared or anxious.  Make sure you take medicines exactly as told by your health care provider.  Know the warning signs of a stroke, and get help right way if you have any of these symptoms. "BE FAST" is an easy way to remember the main warning signs of a stroke. This information is not intended to replace advice given to you by your health care provider. Make sure you discuss any questions you have with your health care provider. Document Revised: 02/02/2019 Document Reviewed: 08/15/2016 Elsevier Patient Education  [place].     IMPORTANT INFORMATION: PAY CLOSE ATTENTION   PHYSICIAN DISCHARGE INSTRUCTIONS  Follow with Primary  care provider  Celene Squibb, MD  and other consultants as instructed by your Hospitalist Physician  [place] IF SYMPTOMS COME BACK, WORSEN OR NEW PROBLEM DEVELOPS   Please note: You were cared for by a hospitalist during your hospital stay. Every effort will be made to forward records to your primary care provider.  You can request that your primary care provider send for your hospital records if they have not received them.  Once you are discharged, your primary care physician will handle any further medical issues. Please note that NO REFILLS for any discharge medications will be authorized once you are discharged, as it is imperative that you return to your primary care physician (or establish a relationship with a primary care physician if you do not have one) for your post hospital discharge needs so that they can reassess your need for medications and monitor your lab values.  Please get a complete blood count and chemistry panel checked by your Primary MD at your next visit, and again as instructed by your Primary MD.  Get Medicines  reviewed and adjusted: Please take all your medications with you for your next visit with your Primary MD  Laboratory/radiological data: Please request your Primary MD to go over all hospital tests and procedure/radiological results at the follow up, please ask your primary care provider to get all Hospital records sent to his/her office.  In some cases, they will be blood work, cultures and biopsy results pending at the time of your discharge. Please request that your primary care provider follow up on these results.  If you are diabetic, please bring your blood sugar readings with you to your follow up appointment with primary care.    Please call and make your follow up appointments as soon as possible.    Also Note the following: If you experience worsening of your admission symptoms, develop shortness of breath, life  threatening emergency, suicidal or homicidal thoughts you must seek medical attention immediately by calling 911 or calling your MD immediately  if symptoms less severe.  You must read complete instructions/literature along with all the possible adverse reactions/side effects for all the Medicines you take and that have been prescribed to you. Take any new Medicines after you have completely understood and accpet all the possible adverse reactions/side effects.   Do not drive when taking Pain medications or sleeping medications (Benzodiazepines)  Do not take more than prescribed Pain, Sleep and Anxiety Medications. It is not advisable to combine anxiety,sleep and pain medications without talking with your primary care practitioner  Special Instructions: If you have smoked or chewed Tobacco  in the last 2 yrs please stop smoking, stop any regular Alcohol  and or any Recreational drug use.  Wear Seat belts while driving.  Do not drive if taking any narcotic, mind altering or controlled substances or recreational drugs or alcohol.

## 2020-01-17 ENCOUNTER — Ambulatory Visit: Payer: Medicare HMO | Admitting: Diagnostic Neuroimaging

## 2020-01-17 ENCOUNTER — Encounter: Payer: Self-pay | Admitting: Diagnostic Neuroimaging

## 2020-01-17 VITALS — BP 140/75 | HR 91 | Ht 70.0 in | Wt 144.4 lb

## 2020-01-17 DIAGNOSIS — I63431 Cerebral infarction due to embolism of right posterior cerebral artery: Secondary | ICD-10-CM

## 2020-01-17 NOTE — Progress Notes (Signed)
[facility]  PATIENT: Ryan Butler DOB: Apr 28, 1951  REFERRING CLINICIAN: Celene Squibb, MD HISTORY FROM: patient  REASON FOR VISIT: new consult    HISTORICAL  CHIEF COMPLAINT:  Chief Complaint  Patient presents with  . Cerebrovascular Accident    rm 7 New Pt/Hospital FU,  son-in-law- Daryl    HISTORY OF PRESENT ILLNESS:   69 year old male here for evaluation of stroke.  History of hypertension, seizures, substance abuse, diabetes, hypercholesterolemia, right MCA stroke in May 2018, left carotid endarterectomy, here for evaluation of right PCA stroke.   Patient had onset of visual changes and loss since 12/24/2019.  Patient had MRI of the brain as an outpatient and was referred to the hospital for acute right PCA stroke.  Stroke work-up was completed in the hospital.  Patient was recommended follow-up in neurology clinic.  Since that time continues to have left visual field deficits.  He has stopped smoking since hospitalization in August 2021.    REVIEW OF SYSTEMS: Full 14 system review of systems performed and negative with exception of: As per HPI.  ALLERGIES: Allergies  Allergen Reactions  . Aspirin Nausea And Vomiting  . Hydrocodone Nausea Only    HOME MEDICATIONS: Outpatient Medications Prior to Visit  Medication Sig Dispense Refill  . ACCU-CHEK AVIVA PLUS test strip USE ONE STRIP TO CHECK GLUCOSE TWICE DAILY 90 each 1  . ACCU-CHEK SOFTCLIX LANCETS lancets USE ONE LANCET TWICE DAILY AS DIRECTED TO CHECK BLOOD GLUCOSE 90 each 1  . amitriptyline (ELAVIL) 150 MG tablet TAKE ONE TABLET BY MOUTH AT BEDTIME AS NEEDED FOR SLEEP (Patient taking differently: Take 150 mg by mouth at bedtime. ) 90 tablet 1  . aspirin EC 81 MG tablet Take 1 tablet (81 mg total) by mouth daily. Swallow whole.  Take with Plavix for 90 days. 90 tablet 0  . atorvastatin (LIPITOR) 80 MG tablet Take 1 tablet (80 mg total) by mouth daily at 6 PM. 30 tablet 1  . baclofen  (LIORESAL) 10 MG tablet Take 1 tablet (10 mg total) by mouth 2 (two) times daily as needed for muscle spasms. 30 each 0  . clopidogrel (PLAVIX) 75 MG tablet Take 1 tablet (75 mg total) by mouth daily with breakfast. 30 tablet 11  . hydrochlorothiazide (HYDRODIURIL) 12.5 MG tablet Take 12.5 mg by mouth daily.    Marland Kitchen losartan (COZAAR) 100 MG tablet Take 100 mg by mouth daily.    . metFORMIN (GLUCOPHAGE-XR) 500 MG 24 hr tablet Take 1 tablet (500 mg total) by mouth daily with breakfast. 60 tablet 2  . Multiple Vitamins-Minerals (MULTIVITAMIN WITH MINERALS) tablet Take 1 tablet by mouth daily.    . Oxycodone HCl 10 MG TABS Take 10 mg by mouth in the morning, at noon, in the evening, and at bedtime.   0  . pantoprazole (PROTONIX) 40 MG tablet Take 1 tablet (40 mg total) by mouth daily. 30 tablet 2   No facility-administered medications prior to visit.    PAST MEDICAL HISTORY: Past Medical History:  Diagnosis Date  . Carotid artery disease ([place])   . Cervical spondylosis   . Closed head injury 1996  . Essential hypertension, benign   . History of colonic polyps   . History of scarlet fever   . History of seizures    unknown etiology and on no meds; last seizure was 2011  . History of TIAs   . Polysubstance abuse ([place])    History of cocaine in his 30's  .  Stroke ([place]) 12/2019   hx of strokes per pt, weakness of left side  . Type 2 diabetes mellitus ([place])    Pt stopped metformin 09/2013  . Vision abnormalities    no peripheral vision in left eye    PAST SURGICAL HISTORY: Past Surgical History:  Procedure Laterality Date  . ANTERIOR CERVICAL DECOMP/DISCECTOMY FUSION N/A 12/05/2013   Procedure: ANTERIOR CERVICAL DECOMPRESSION/DISCECTOMY FUSION 2 LEVELS;  Surgeon: Elaina Hoops, MD;  Location: [place];  Service: Neurosurgery;  Laterality: N/A;  Anterior Cervical Discectomy and Fusion with PEEK Cages and allograft Cervical Four-Five/Five-Six  . CATARACT EXTRACTION W/PHACO Left 05/24/2018    Procedure: CATARACT EXTRACTION PHACO AND INTRAOCULAR LENS PLACEMENT (IOC);  Surgeon: Tonny Branch, MD;  Location: [facility];  Service: Ophthalmology;  Laterality: Left;  CDE: 7.67  . CATARACT EXTRACTION W/PHACO Right 11/21/2019   Procedure: CATARACT EXTRACTION PHACO AND INTRAOCULAR LENS PLACEMENT RIGHT EYE;  Surgeon: Baruch Goldmann, MD;  Location: [facility];  Service: Ophthalmology;  Laterality: Right;  CDE: 6.26  . FRACTURE SURGERY    . Hand laceration repair Left   . Left carotid endarterectomy     6/06 - Dr. Scot Dock  . skin graft to leg Left   . Tibia/fibula fracture repair Left   . VASCULAR SURGERY      FAMILY HISTORY: Family History  Problem Relation Age of Onset  . Cancer Father   . Coronary artery disease Mother   . Diabetes Mother   . Fibromyalgia Sister   . Cancer Brother   . Cancer Brother     SOCIAL HISTORY: Social History   Socioeconomic History  . Marital status: Divorced    Spouse name: Not on file  . Number of children: Not on file  . Years of education: 70  . Highest education level: 9th grade  Occupational History  . Not on file  Tobacco Use  . Smoking status: Former Smoker    Packs/day: 0.50    Years: 55.00    Pack years: 27.50    Types: Cigarettes    Quit date: 12/17/2019    Years since quitting: 0.0  . Smokeless tobacco: Never Used  . Tobacco comment: smokes 1/2 pack per day now; reports that he starting to quick  Vaping Use  . Vaping Use: Never used  Substance and Sexual Activity  . Alcohol use: Yes    Comment: seldom  . Drug use: Yes    Types: Marijuana, Oxycodone    Comment: 01/17/20 uses daily; instructed not to use prior to procedure.  . Sexual activity: Not Currently    Birth control/protection: None  Other Topics Concern  . Not on file  Social History Narrative   01/17/20 lives alone   Social Determinants of Health   Financial Resource Strain:   . Difficulty of Paying Living Expenses: Not on file  Food Insecurity:   . Worried About  Charity fundraiser in the Last Year: Not on file  . Ran Out of Food in the Last Year: Not on file  Transportation Needs:   . Lack of Transportation (Medical): Not on file  . Lack of Transportation (Non-Medical): Not on file  Physical Activity:   . Days of Exercise per Week: Not on file  . Minutes of Exercise per Session: Not on file  Stress:   . Feeling of Stress : Not on file  Social Connections:   . Frequency of Communication with Friends and Family: Not on file  . Frequency of Social Gatherings with  Friends and Family: Not on file  . Attends Religious Services: Not on file  . Active Member of Clubs or Organizations: Not on file  . Attends Archivist Meetings: Not on file  . Marital Status: Not on file  Intimate Partner Violence:   . Fear of Current or Ex-Partner: Not on file  . Emotionally Abused: Not on file  . Physically Abused: Not on file  . Sexually Abused: Not on file     PHYSICAL EXAM  GENERAL EXAM/CONSTITUTIONAL: Vitals:  Vitals:   01/17/20 1425  BP: 140/75  Pulse: 91  Weight: 144 lb 6.4 oz (65.5 kg)  Height: $Remove'5\' 10"'qeidXLe$  (1.778 m)     Body mass index is 20.72 kg/m. Wt Readings from Last 3 Encounters:  01/17/20 144 lb 6.4 oz (65.5 kg)  12/24/19 147 lb (66.7 kg)  11/21/19 147 lb (66.7 kg)     Patient is in no distress; well developed, nourished and groomed; neck is supple  CARDIOVASCULAR:  Examination of carotid arteries is normal; no carotid bruits  Regular rate and rhythm, no murmurs  Examination of peripheral vascular system by observation and palpation is normal  EYES:  Ophthalmoscopic exam of optic discs and posterior segments is normal; no papilledema or hemorrhages  No exam data present  MUSCULOSKELETAL:  Gait, strength, tone, movements noted in Neurologic exam below  NEUROLOGIC: MENTAL STATUS:  No flowsheet data found.  awake, alert, oriented to person, place and time  recent and remote memory intact  normal attention  and concentration  language fluent, comprehension intact, naming intact  fund of knowledge appropriate  CRANIAL NERVE:   2nd - no papilledema on fundoscopic exam  2nd, 3rd, 4th, 6th - pupils equal and reactive to light, visual fields --> LEFT HOMNYMOUS HEMIANOPSIA; extraocular muscles intact, no nystagmus  5th - facial sensation symmetric  7th - facial strength symmetric  8th - hearing intact  9th - palate elevates symmetrically, uvula midline  11th - shoulder shrug symmetric  12th - tongue protrusion midline  MOTOR:   normal bulk and tone, full strength in the RUE, RLE; LUE 4; LLE 4 PROX AND 3 DISTAL  SENSORY:   normal and symmetric to light touch, temperature, vibration; DECR IN LEFT LEG  COORDINATION:   finger-nose-finger, fine finger movements normal  REFLEXES:   deep tendon reflexes TRACE and symmetric  GAIT/STATION:   ANTALGIC GAIT     DIAGNOSTIC DATA (LABS, IMAGING, TESTING) - I reviewed patient records, labs, notes, testing and imaging myself where available.  Lab Results  Component Value Date   WBC 5.7 12/29/2019   HGB 11.5 (L) 12/29/2019   HCT 34.9 (L) 12/29/2019   MCV 96.1 12/29/2019   PLT 141 (L) 12/29/2019      Component Value Date/Time   NA 136 12/29/2019 1615   K 3.5 12/29/2019 1615   CL 101 12/29/2019 1615   CO2 27 12/29/2019 1615   GLUCOSE 83 12/29/2019 1615   BUN 16 12/29/2019 1615   CREATININE 1.11 12/29/2019 1615   CALCIUM 9.4 12/29/2019 1615   PROT 7.5 12/29/2019 1615   ALBUMIN 4.4 12/29/2019 1615   AST 21 12/29/2019 1615   ALT 24 12/29/2019 1615   ALKPHOS 98 12/29/2019 1615   BILITOT 0.5 12/29/2019 1615   GFRNONAA >60 12/29/2019 1615   GFRAA >60 12/29/2019 1615   Lab Results  Component Value Date   CHOL 95 12/30/2019   HDL 41 12/30/2019   LDLCALC 40 12/30/2019   TRIG 70 12/30/2019  CHOLHDL 2.3 12/30/2019   Lab Results  Component Value Date   HGBA1C 6.2 (H) 12/29/2019   No results found for: [id number] Lab  Results  Component Value Date   TSH 3.83 01/07/2017    12/29/19 MRI brain / MRA head [I reviewed images myself and agree with interpretation. -VRP]  1. Large acute right PCA infarct. 2. Chronic ischemia with multiple old infarcts as above. 3. Progressive intracranial atherosclerosis with multiple new severe stenoses in the anterior and posterior circulations as well as new right P3 occlusion.  12/30/19 carotid u/s Right: Color duplex indicates minimal heterogeneous and calcified plaque, with no hemodynamically significant stenosis by duplex criteria in the extracranial cerebrovascular circulation.  Left: Note that established duplex criteria have not been validated in the setting of prior carotid endarterectomy, however, there is no evidence of recurrent stenosis at the left sided surgical site.  12/30/19 TTE 1. Left ventricular ejection fraction, by estimation, is 60 to 65%. The  left ventricle has normal function. The left ventricle has no regional  wall motion abnormalities. There is mild left ventricular hypertrophy.  Left ventricular diastolic parameters  are consistent with Grade I diastolic dysfunction (impaired relaxation).  Elevated left atrial pressure.  2. Right ventricular systolic function is normal. The right ventricular  size is normal. There is normal pulmonary artery systolic pressure.  3. The mitral valve is normal in structure. Trivial mitral valve  regurgitation. No evidence of mitral stenosis.  4. The aortic valve is tricuspid. Aortic valve regurgitation is not  visualized. No aortic stenosis is present.  5. The inferior vena cava is normal in size with greater than 50%  respiratory variability, suggesting right atrial pressure of 3 mmHg.     ASSESSMENT AND PLAN  68 y.o. year old male here with hypertension, diabetes, hyperlipidemia, smoking, right MCA stroke, left carotid endarterectomy, now with right PCA stroke July 2021.  Dx:  1. Cerebrovascular  accident (CVA) due to embolism of right posterior cerebral artery (HCC)      PLAN:  SECONDARY STROKE PREVENTION ([place]olic) - continue aspirin $RemoveBeforeDE'81mg'gsjnqJTRoiyfctB$  + plavix $Remove'75mg'reUqSAM$  daily x 3 months; then reduce to plavix $RemoveB'75mg'ddyNAidL$  alone - continue BP, DM, lipid control  - check TEE, if negative then check implanted loop recorder  Orders Placed This Encounter  Procedures  . Ambulatory referral to Cardiology   Return for pending if symptoms worsen or fail to improve.    Penni Bombard, MD 4/58/5929, 2:44 PM Certified in Neurology, Neurophysiology and Neuroimaging  [facility] [address] [place], [place] [address] [phone]

## 2020-01-17 NOTE — Patient Instructions (Signed)
SECONDARY STROKE PREVENTION (embolic) - continue aspirin $RemoveBeforeDE'81mg'rvRrrrIaQwdkmFB$  + plavix $Remove'75mg'HUgPhjH$  daily x 3 months; then reduce to plavix $RemoveB'75mg'snlEpPzE$  alone  - continue BP, DM, lipid control   - check TEE, if negative then check implanted loop recorder

## 2020-02-22 ENCOUNTER — Other Ambulatory Visit: Payer: Self-pay

## 2020-02-22 ENCOUNTER — Encounter: Payer: Self-pay | Admitting: Internal Medicine

## 2020-02-22 ENCOUNTER — Ambulatory Visit: Payer: Medicare HMO | Admitting: Internal Medicine

## 2020-02-22 VITALS — BP 142/74 | HR 92 | Ht 70.0 in | Wt 137.2 lb

## 2020-02-22 DIAGNOSIS — I63011 Cerebral infarction due to thrombosis of right vertebral artery: Secondary | ICD-10-CM | POA: Diagnosis not present

## 2020-02-22 NOTE — Progress Notes (Signed)
Electrophysiology Office Note   Date:  02/22/2020   ID:  Elin, Seats [birth year], MRN [id number]  PCP:  Celene Squibb, MD    Primary Electrophysiologist: Thompson Grayer, MD    CC: stroke   History of Present Illness: Ryan Butler is a 69 y.o. male who presents today for electrophysiology evaluation.   He is referred by Dr Leta Baptist for further evaluation of cardiac causes for stroke. He has a h/o seizures and recurrent stroke.  He had R MCA stroke in 2018 and more recently R PCA stroke.  He wore a holter in 2018 which did not reveal any afib.  He did have atrial ectopy at times in bigeminy. There is concern for cardioembolic source. He is on ASA and plavix currently Today, he denies symptoms of palpitations, chest pain, shortness of breath, orthopnea, PND, lower extremity edema, claudication, dizziness, presyncope, syncope, bleeding, or neurologic sequela. The patient is tolerating medications without difficulties and is otherwise without complaint today.    Past Medical History:  Diagnosis Date  . Carotid artery disease ([place])   . Cervical spondylosis   . Closed head injury 1996  . Essential hypertension, benign   . History of colonic polyps   . History of scarlet fever   . History of seizures    unknown etiology and on no meds; last seizure was 2011  . History of TIAs   . Polysubstance abuse ([place])    History of cocaine in his 30's  . Stroke ([place]) 12/2019   hx of strokes per pt, weakness of left side  . Type 2 diabetes mellitus ([place])    Pt stopped metformin 09/2013  . Vision abnormalities    no peripheral vision in left eye   Past Surgical History:  Procedure Laterality Date  . ANTERIOR CERVICAL DECOMP/DISCECTOMY FUSION N/A 12/05/2013   Procedure: ANTERIOR CERVICAL DECOMPRESSION/DISCECTOMY FUSION 2 LEVELS;  Surgeon: Elaina Hoops, MD;  Location: [facility];  Service: Neurosurgery;  Laterality: N/A;  Anterior Cervical Discectomy and Fusion with PEEK Cages and  allograft Cervical Four-Five/Five-Six  . CATARACT EXTRACTION W/PHACO Left 05/24/2018   Procedure: CATARACT EXTRACTION PHACO AND INTRAOCULAR LENS PLACEMENT (IOC);  Surgeon: Tonny Branch, MD;  Location: [facility];  Service: Ophthalmology;  Laterality: Left;  CDE: 7.67  . CATARACT EXTRACTION W/PHACO Right 11/21/2019   Procedure: CATARACT EXTRACTION PHACO AND INTRAOCULAR LENS PLACEMENT RIGHT EYE;  Surgeon: Baruch Goldmann, MD;  Location: [facility];  Service: Ophthalmology;  Laterality: Right;  CDE: 6.26  . FRACTURE SURGERY    . Hand laceration repair Left   . Left carotid endarterectomy     6/06 - Dr. Scot Dock  . skin graft to leg Left   . Tibia/fibula fracture repair Left   . VASCULAR SURGERY       Current Outpatient Medications  Medication Sig Dispense Refill  . ACCU-CHEK AVIVA PLUS test strip USE ONE STRIP TO CHECK GLUCOSE TWICE DAILY 90 each 1  . ACCU-CHEK SOFTCLIX LANCETS lancets USE ONE LANCET TWICE DAILY AS DIRECTED TO CHECK BLOOD GLUCOSE 90 each 1  . amitriptyline (ELAVIL) 150 MG tablet TAKE ONE TABLET BY MOUTH AT BEDTIME AS NEEDED FOR SLEEP 90 tablet 1  . aspirin EC 81 MG tablet Take 1 tablet (81 mg total) by mouth daily. Swallow whole.  Take with Plavix for 90 days. 90 tablet 0  . atorvastatin (LIPITOR) 80 MG tablet Take 1 tablet (80 mg total) by mouth daily at 6 PM. 30 tablet 1  . baclofen (  LIORESAL) 10 MG tablet Take 1 tablet (10 mg total) by mouth 2 (two) times daily as needed for muscle spasms. 30 each 0  . clopidogrel (PLAVIX) 75 MG tablet Take 1 tablet (75 mg total) by mouth daily with breakfast. 30 tablet 11  . hydrochlorothiazide (HYDRODIURIL) 12.5 MG tablet Take 12.5 mg by mouth daily.    Marland Kitchen losartan (COZAAR) 100 MG tablet Take 100 mg by mouth daily.    . metFORMIN (GLUCOPHAGE-XR) 500 MG 24 hr tablet Take 1 tablet (500 mg total) by mouth daily with breakfast. 60 tablet 2  . Multiple Vitamins-Minerals (MULTIVITAMIN WITH MINERALS) tablet Take 1 tablet by mouth daily.    . Oxycodone HCl  10 MG TABS Take 10 mg by mouth in the morning, at noon, in the evening, and at bedtime.   0  . pantoprazole (PROTONIX) 40 MG tablet Take 1 tablet (40 mg total) by mouth daily. 30 tablet 2   No current facility-administered medications for this visit.    Allergies:   Aspirin and Hydrocodone   Social History:  The patient  reports that he quit smoking about 2 months ago. His smoking use included cigarettes. He has a 27.50 pack-year smoking history. He has never used smokeless tobacco. He reports current alcohol use. He reports current drug use. Drugs: Marijuana and Oxycodone.   Family History:  The patient's family history includes Cancer in his brother, brother, and father; Coronary artery disease in his mother; Diabetes in his mother; Fibromyalgia in his sister.    ROS:  Please see the history of present illness.   All other systems are personally reviewed and negative.    PHYSICAL EXAM: VS:  BP (!) 142/74   Pulse 92   Ht $R'5\' 10"'hP$  (1.778 m)   Wt 137 lb 3.2 oz (62.2 kg)   SpO2 97%   BMI 19.69 kg/m  , BMI Body mass index is 19.69 kg/m. GEN: Well nourished, well developed, in no acute distress HEENT: normal Neck: no JVD, carotid bruits, or masses Cardiac: RRR  Respiratory:    normal work of breathing GI: soft  MS: no deformity or atrophy Skin: warm and dry  Neuro:  Strength and sensation are intact Psych: euthymic mood, full affect  EKG:  EKG is ordered today. The ekg ordered today is personally reviewed and shows sinus   Recent Labs: 12/29/2019: ALT 24; BUN 16; Creatinine, Ser 1.11; Hemoglobin 11.5; Platelets 141; Potassium 3.5; Sodium 136  personally reviewed   Lipid Panel     Component Value Date/Time   CHOL 95 12/30/2019 0556   TRIG 70 12/30/2019 0556   HDL 41 12/30/2019 0556   CHOLHDL 2.3 12/30/2019 0556   VLDL 14 12/30/2019 0556   LDLCALC 40 12/30/2019 0556   personally reviewed   Wt Readings from Last 3 Encounters:  02/22/20 137 lb 3.2 oz (62.2 kg)  01/17/20  144 lb 6.4 oz (65.5 kg)  12/24/19 147 lb (66.7 kg)    echo 12/30/19- EF 60% Multiple ekgs in epic are reviewed and do not reveal afib  Other studies personally reviewed: Additional studies/ records that were reviewed today include: Dr Gladstone Lighter notes. Prior ekgs, echo Review of the above records today demonstrates: as above   ASSESSMENT AND PLAN:  1.  Cryptogenic stroke  The patient presents with cryptogenic stroke.  He has had recurrent stroke.  He has worn previous monitors which did not capture afib.  I agree with Dr Leta Baptist that TEE and long term monitoring with an implanted loop  recorder are indicated to further evaluate for cardioemoblic source.  I spoke at length with the patient about monitoring for afib with an implantable loop recorder.  Risks, benefits, and alteratives to TEE and implantable loop recorder were discussed with the patient today.   He understands that risks including but are noted limited to damage to teeth/ aspiration/ risks of anesthesia/ esophageal injury/ bleeding and infection.  At this time, the patient is very clear in their decision to proceed with TEE and subsequent implantable loop recorder.  We will arrange for the next available time.   2. HTN Stable No change required today  Signed, Thompson Grayer, MD  02/22/2020 11:37 AM     [facility] [address] [place]  [address] [phone] (office) [phone] (fax)

## 2020-02-22 NOTE — H&P (View-Only) (Signed)
Electrophysiology Office Note   Date:  02/22/2020   ID:  Ryan Butler, Ryan Butler 1950-12-28, MRN [id number]  PCP:  Celene Squibb, MD    Primary Electrophysiologist: Thompson Grayer, MD    CC: stroke   History of Present Illness: Ryan Butler is a 69 y.o. male who presents today for electrophysiology evaluation.   He is referred by Dr Leta Baptist for further evaluation of cardiac causes for stroke. He has a h/o seizures and recurrent stroke.  He had R MCA stroke in 2018 and more recently R PCA stroke.  He wore a holter in 2018 which did not reveal any afib.  He did have atrial ectopy at times in bigeminy. There is concern for cardioembolic source. He is on ASA and plavix currently Today, he denies symptoms of palpitations, chest pain, shortness of breath, orthopnea, PND, lower extremity edema, claudication, dizziness, presyncope, syncope, bleeding, or neurologic sequela. The patient is tolerating medications without difficulties and is otherwise without complaint today.    Past Medical History:  Diagnosis Date  . Carotid artery disease ([place])   . Cervical spondylosis   . Closed head injury 1996  . Essential hypertension, benign   . History of colonic polyps   . History of scarlet fever   . History of seizures    unknown etiology and on no meds; last seizure was 2011  . History of TIAs   . Polysubstance abuse ([place])    History of cocaine in his 30's  . Stroke ([place]) 12/2019   hx of strokes per pt, weakness of left side  . Type 2 diabetes mellitus ([place])    Pt stopped metformin 09/2013  . Vision abnormalities    no peripheral vision in left eye   Past Surgical History:  Procedure Laterality Date  . ANTERIOR CERVICAL DECOMP/DISCECTOMY FUSION N/A 12/05/2013   Procedure: ANTERIOR CERVICAL DECOMPRESSION/DISCECTOMY FUSION 2 LEVELS;  Surgeon: Elaina Hoops, MD;  Location: [facility];  Service: Neurosurgery;  Laterality: N/A;  Anterior Cervical Discectomy and Fusion with PEEK Cages and  allograft Cervical Four-Five/Five-Six  . CATARACT EXTRACTION W/PHACO Left 05/24/2018   Procedure: CATARACT EXTRACTION PHACO AND INTRAOCULAR LENS PLACEMENT (IOC);  Surgeon: Tonny Branch, MD;  Location: [facility];  Service: Ophthalmology;  Laterality: Left;  CDE: 7.67  . CATARACT EXTRACTION W/PHACO Right 11/21/2019   Procedure: CATARACT EXTRACTION PHACO AND INTRAOCULAR LENS PLACEMENT RIGHT EYE;  Surgeon: Baruch Goldmann, MD;  Location: [facility];  Service: Ophthalmology;  Laterality: Right;  CDE: 6.26  . FRACTURE SURGERY    . Hand laceration repair Left   . Left carotid endarterectomy     6/06 - Dr. Scot Dock  . skin graft to leg Left   . Tibia/fibula fracture repair Left   . VASCULAR SURGERY       Current Outpatient Medications  Medication Sig Dispense Refill  . ACCU-CHEK AVIVA PLUS test strip USE ONE STRIP TO CHECK GLUCOSE TWICE DAILY 90 each 1  . ACCU-CHEK SOFTCLIX LANCETS lancets USE ONE LANCET TWICE DAILY AS DIRECTED TO CHECK BLOOD GLUCOSE 90 each 1  . amitriptyline (ELAVIL) 150 MG tablet TAKE ONE TABLET BY MOUTH AT BEDTIME AS NEEDED FOR SLEEP 90 tablet 1  . aspirin EC 81 MG tablet Take 1 tablet (81 mg total) by mouth daily. Swallow whole.  Take with Plavix for 90 days. 90 tablet 0  . atorvastatin (LIPITOR) 80 MG tablet Take 1 tablet (80 mg total) by mouth daily at 6 PM. 30 tablet 1  . baclofen (  LIORESAL) 10 MG tablet Take 1 tablet (10 mg total) by mouth 2 (two) times daily as needed for muscle spasms. 30 each 0  . clopidogrel (PLAVIX) 75 MG tablet Take 1 tablet (75 mg total) by mouth daily with breakfast. 30 tablet 11  . hydrochlorothiazide (HYDRODIURIL) 12.5 MG tablet Take 12.5 mg by mouth daily.    Marland Kitchen losartan (COZAAR) 100 MG tablet Take 100 mg by mouth daily.    . metFORMIN (GLUCOPHAGE-XR) 500 MG 24 hr tablet Take 1 tablet (500 mg total) by mouth daily with breakfast. 60 tablet 2  . Multiple Vitamins-Minerals (MULTIVITAMIN WITH MINERALS) tablet Take 1 tablet by mouth daily.    . Oxycodone HCl  10 MG TABS Take 10 mg by mouth in the morning, at noon, in the evening, and at bedtime.   0  . pantoprazole (PROTONIX) 40 MG tablet Take 1 tablet (40 mg total) by mouth daily. 30 tablet 2   No current facility-administered medications for this visit.    Allergies:   Aspirin and Hydrocodone   Social History:  The patient  reports that he quit smoking about 2 months ago. His smoking use included cigarettes. He has a 27.50 pack-year smoking history. He has never used smokeless tobacco. He reports current alcohol use. He reports current drug use. Drugs: Marijuana and Oxycodone.   Family History:  The patient's family history includes Cancer in his brother, brother, and father; Coronary artery disease in his mother; Diabetes in his mother; Fibromyalgia in his sister.    ROS:  Please see the history of present illness.   All other systems are personally reviewed and negative.    PHYSICAL EXAM: VS:  BP (!) 142/74   Pulse 92   Ht $R'5\' 10"'vZ$  (1.778 m)   Wt 137 lb 3.2 oz (62.2 kg)   SpO2 97%   BMI 19.69 kg/m  , BMI Body mass index is 19.69 kg/m. GEN: Well nourished, well developed, in no acute distress HEENT: normal Neck: no JVD, carotid bruits, or masses Cardiac: RRR  Respiratory:    normal work of breathing GI: soft  MS: no deformity or atrophy Skin: warm and dry  Neuro:  Strength and sensation are intact Psych: euthymic mood, full affect  EKG:  EKG is ordered today. The ekg ordered today is personally reviewed and shows sinus   Recent Labs: 12/29/2019: ALT 24; BUN 16; Creatinine, Ser 1.11; Hemoglobin 11.5; Platelets 141; Potassium 3.5; Sodium 136  personally reviewed   Lipid Panel     Component Value Date/Time   CHOL 95 12/30/2019 0556   TRIG 70 12/30/2019 0556   HDL 41 12/30/2019 0556   CHOLHDL 2.3 12/30/2019 0556   VLDL 14 12/30/2019 0556   LDLCALC 40 12/30/2019 0556   personally reviewed   Wt Readings from Last 3 Encounters:  02/22/20 137 lb 3.2 oz (62.2 kg)  01/17/20  144 lb 6.4 oz (65.5 kg)  12/24/19 147 lb (66.7 kg)    echo 12/30/19- EF 60% Multiple ekgs in epic are reviewed and do not reveal afib  Other studies personally reviewed: Additional studies/ records that were reviewed today include: Dr Gladstone Lighter notes. Prior ekgs, echo Review of the above records today demonstrates: as above   ASSESSMENT AND PLAN:  1.  Cryptogenic stroke  The patient presents with cryptogenic stroke.  He has had recurrent stroke.  He has worn previous monitors which did not capture afib.  I agree with Dr Leta Baptist that TEE and long term monitoring with an implanted loop  recorder are indicated to further evaluate for cardioemoblic source.  I spoke at length with the patient about monitoring for afib with an implantable loop recorder.  Risks, benefits, and alteratives to TEE and implantable loop recorder were discussed with the patient today.   He understands that risks including but are noted limited to damage to teeth/ aspiration/ risks of anesthesia/ esophageal injury/ bleeding and infection.  At this time, the patient is very clear in their decision to proceed with TEE and subsequent implantable loop recorder.  We will arrange for the next available time.   2. HTN Stable No change required today  Signed, Thompson Grayer, MD  02/22/2020 11:37 AM     [facility] [address] [place] [address] [phone] (office) [phone] (fax)

## 2020-02-22 NOTE — Patient Instructions (Addendum)
Medication Instructions:  Your physician recommends that you continue on your current medications as directed. Please refer to the Current Medication list given to you today.  *If you need a refill on your cardiac medications before your next appointment, please call your pharmacy*  Lab Work: None ordered.  If you have labs (blood work) drawn today and your tests are completely normal, you will receive your results only by: Marland Kitchen MyChart Message (if you have MyChart) OR . A paper copy in the mail If you have any lab test that is abnormal or we need to change your treatment, we will call you to review the results.  Testing/Procedures: Your physician has requested that you have a TEE. During a TEE, sound waves are used to create images of your heart. It provides your doctor with information about the size and shape of your heart and how well your heart's chambers and valves are working. In this test, a transducer is attached to the end of a flexible tube that's guided down your throat and into your esophagus (the tube leading from you mouth to your stomach) to get a more detailed image of your heart. You are not awake for the procedure. Please see the instruction sheet given to you today. For further information please visit [email or web address].   Follow-Up: At [facility], you and your health needs are our priority.  As part of our continuing mission to provide you with exceptional heart care, we have created designated Provider Care Teams.  These Care Teams include your primary Cardiologist (physician) and Advanced Practice Providers (APPs -  Physician Assistants and Nurse Practitioners) who all work together to provide you with the care you need, when you need it.  We recommend signing up for the patient portal called "MyChart".  Sign up information is provided on this After Visit Summary.  MyChart is used to connect with patients for Virtual Visits (Telemedicine).  Patients are able to view  lab/test results, encounter notes, upcoming appointments, etc.  Non-urgent messages can be sent to your provider as well.   To learn more about what you can do with MyChart, go to [email or web address].    Your next appointment:   Your physician wants you to follow-up in: as needed  Other Instructions:  You are scheduled for a TEE on March 08, 2020 with Dr. Acie Fredrickson.  Please arrive at the [facility] (Main Entrance A) at [facility]: [address] [place], [place] [address] at 8 am.  After this procedure you will be prepped for an implantable loop recorder with Dr. Rayann Heman.   DIET: Nothing to eat or drink after midnight except a sip of water with medications (see medication instructions below)  Medication Instructions: Hold Metformin  Continue your anticoagulant: Plavix You will need to continue your anticoagulant after your procedure until you  are told by your  Provider that it is safe to stop  You must have a responsible person to drive you home and stay in the waiting area during your procedure. Failure to do so could result in cancellation.  Bring your insurance cards.  *Special Note: Every effort is made to have your procedure done on time. Occasionally there are emergencies that occur at the hospital that may cause delays. Please be patient if a delay does occur.

## 2020-03-07 ENCOUNTER — Telehealth: Payer: Self-pay | Admitting: Internal Medicine

## 2020-03-07 NOTE — Telephone Encounter (Signed)
Ryan Butler in endoscopy with [facility] called stating patient was unaware of covid test that is needed to complete TEE procedure. Patient needs to be rescheduled. Please call patient.

## 2020-03-07 NOTE — Telephone Encounter (Signed)
Spoke to [place] and apologized that I missed the covid testing. Advised that they will need to arrive at short stay at 7 am tomorrow for a rapid covid test prior to the TEE and ILR.

## 2020-03-08 ENCOUNTER — Encounter (HOSPITAL_COMMUNITY)
Admission: RE | Disposition: A | Discharge status: Home / Self Care | Payer: Self-pay | Source: Home / Self Care | Attending: Cardiovascular Disease

## 2020-03-08 ENCOUNTER — Encounter (HOSPITAL_COMMUNITY): Payer: Self-pay | Admitting: Cardiovascular Disease

## 2020-03-08 ENCOUNTER — Other Ambulatory Visit: Payer: Self-pay

## 2020-03-08 ENCOUNTER — Ambulatory Visit (HOSPITAL_BASED_OUTPATIENT_CLINIC_OR_DEPARTMENT_OTHER)
Admission: RE | Admit: 2020-03-08 | Discharge: 2020-03-08 | Disposition: A | Discharge status: Home / Self Care | Payer: Medicare HMO | Source: Home / Self Care | Attending: Cardiovascular Disease | Admitting: Cardiovascular Disease

## 2020-03-08 ENCOUNTER — Ambulatory Visit (HOSPITAL_COMMUNITY): Payer: Medicare HMO | Admitting: Anesthesiology

## 2020-03-08 ENCOUNTER — Telehealth: Payer: Self-pay | Admitting: Internal Medicine

## 2020-03-08 ENCOUNTER — Ambulatory Visit (HOSPITAL_COMMUNITY)
Admission: RE | Admit: 2020-03-08 | Discharge: 2020-03-08 | Disposition: A | Discharge status: Home / Self Care | Payer: Medicare HMO | Attending: Cardiovascular Disease | Admitting: Cardiovascular Disease

## 2020-03-08 ENCOUNTER — Encounter: Payer: Self-pay | Admitting: Internal Medicine

## 2020-03-08 DIAGNOSIS — Z20822 Contact with and (suspected) exposure to covid-19: Secondary | ICD-10-CM | POA: Insufficient documentation

## 2020-03-08 DIAGNOSIS — I10 Essential (primary) hypertension: Secondary | ICD-10-CM | POA: Diagnosis not present

## 2020-03-08 DIAGNOSIS — Z7984 Long term (current) use of oral hypoglycemic drugs: Secondary | ICD-10-CM | POA: Diagnosis not present

## 2020-03-08 DIAGNOSIS — Z87891 Personal history of nicotine dependence: Secondary | ICD-10-CM | POA: Insufficient documentation

## 2020-03-08 DIAGNOSIS — Z7902 Long term (current) use of antithrombotics/antiplatelets: Secondary | ICD-10-CM | POA: Diagnosis not present

## 2020-03-08 DIAGNOSIS — Z79899 Other long term (current) drug therapy: Secondary | ICD-10-CM | POA: Insufficient documentation

## 2020-03-08 DIAGNOSIS — Z885 Allergy status to narcotic agent status: Secondary | ICD-10-CM | POA: Insufficient documentation

## 2020-03-08 DIAGNOSIS — I6389 Other cerebral infarction: Secondary | ICD-10-CM | POA: Diagnosis not present

## 2020-03-08 DIAGNOSIS — I639 Cerebral infarction, unspecified: Secondary | ICD-10-CM

## 2020-03-08 DIAGNOSIS — Z7982 Long term (current) use of aspirin: Secondary | ICD-10-CM | POA: Insufficient documentation

## 2020-03-08 DIAGNOSIS — Z886 Allergy status to analgesic agent status: Secondary | ICD-10-CM | POA: Diagnosis not present

## 2020-03-08 DIAGNOSIS — E119 Type 2 diabetes mellitus without complications: Secondary | ICD-10-CM | POA: Insufficient documentation

## 2020-03-08 DIAGNOSIS — I340 Nonrheumatic mitral (valve) insufficiency: Secondary | ICD-10-CM | POA: Diagnosis not present

## 2020-03-08 HISTORY — PX: TEE WITHOUT CARDIOVERSION: SHX5443

## 2020-03-08 HISTORY — PX: LOOP RECORDER INSERTION: EP1214

## 2020-03-08 HISTORY — PX: BUBBLE STUDY: SHX6837

## 2020-03-08 LAB — SARS CORONAVIRUS 2 BY RT PCR (HOSPITAL ORDER, PERFORMED IN ~~LOC~~ HOSPITAL LAB): SARS Coronavirus 2: NEGATIVE

## 2020-03-08 LAB — GLUCOSE, CAPILLARY
Glucose-Capillary: 110 mg/dL — ABNORMAL HIGH (ref 70–99)
Glucose-Capillary: 108 mg/dL — ABNORMAL HIGH (ref 70–99)

## 2020-03-08 SURGERY — ECHOCARDIOGRAM, TRANSESOPHAGEAL
Anesthesia: Monitor Anesthesia Care

## 2020-03-08 SURGERY — LOOP RECORDER INSERTION
Anesthesia: LOCAL

## 2020-03-08 MED ORDER — LIDOCAINE-EPINEPHRINE 1 %-1:100000 IJ SOLN
INTRAMUSCULAR | Status: DC | PRN
  Administered 2020-03-08: 10 mL

## 2020-03-08 MED ORDER — LACTATED RINGERS IV SOLN: INTRAVENOUS | Status: DC

## 2020-03-08 MED ORDER — BUTAMBEN-TETRACAINE-BENZOCAINE 2-2-14 % EX AERO
INHALATION_SPRAY | CUTANEOUS | Status: DC | PRN
  Administered 2020-03-08: 2 via TOPICAL

## 2020-03-08 MED ORDER — PROPOFOL 500 MG/50ML IV EMUL
INTRAVENOUS | Status: DC | PRN
  Administered 2020-03-08: 100 ug/kg/min via INTRAVENOUS

## 2020-03-08 MED ORDER — PROPOFOL 10 MG/ML IV BOLUS
INTRAVENOUS | Status: DC | PRN
  Administered 2020-03-08: 25 mg via INTRAVENOUS

## 2020-03-08 MED ORDER — SODIUM CHLORIDE 0.9 % IV SOLN: INTRAVENOUS | Status: DC

## 2020-03-08 MED ORDER — LACTATED RINGERS IV SOLN: INTRAVENOUS | Status: DC | PRN

## 2020-03-08 MED ORDER — LIDOCAINE-EPINEPHRINE 1 %-1:100000 IJ SOLN
INTRAMUSCULAR | Status: AC
  Filled 2020-03-08: qty 1

## 2020-03-08 SURGICAL SUPPLY — 2 items
MONITOR REVEAL LINQ II (Prosthesis & Implant Heart) ×1 IMPLANT
PACK LOOP INSERTION (CUSTOM PROCEDURE TRAY) ×2 IMPLANT

## 2020-03-08 NOTE — Telephone Encounter (Signed)
Patient's daughter is confused about the patient's procedure today. She states he had to be at the hospital at 7:00 am for his rapid covid test, but the procedure is not until 1:30 pm.

## 2020-03-08 NOTE — Discharge Instructions (Signed)
Wound care instructions (heart monitor implant) Keep incision clean and dry for 3 days. You can remove outer dressing tomorrow. Leave steri-strips (little pieces of tape) on until seen in the office for wound check appointment. Call the office [phone]) for redness, drainage, swelling, or fever.     TEE  YOU HAD AN CARDIAC PROCEDURE TODAY: Refer to the procedure report and other information in the discharge instructions given to you for any specific questions about what was found during the examination. If this information does not answer your questions, please call [facility] office at [phone] to clarify.   DIET: Your first meal following the procedure should be a light meal and then it is ok to progress to your normal diet. A half-sandwich or bowl of soup is an example of a good first meal. Heavy or fried foods are harder to digest and may make you feel nauseous or bloated. Drink plenty of fluids but you should avoid alcoholic beverages for 24 hours. If you had a esophageal dilation, please see attached instructions for diet.   ACTIVITY: Your care partner should take you home directly after the procedure. You should plan to take it easy, moving slowly for the rest of the day. You can resume normal activity the day after the procedure however YOU SHOULD NOT DRIVE, use power tools, machinery or perform tasks that involve climbing or major physical exertion for 24 hours (because of the sedation medicines used during the test).   SYMPTOMS TO REPORT IMMEDIATELY: A cardiologist can be reached at any hour. Please call your cardiologist for any of the following symptoms:  Vomiting of blood or coffee ground material  New, significant abdominal pain  New, significant chest pain or pain under the shoulder blades  Painful or persistently difficult swallowing  New shortness of breath  Black, tarry-looking or red, bloody stools  FOLLOW UP:  Please also call with any specific questions about  appointments or follow up tests.

## 2020-03-08 NOTE — Anesthesia Preprocedure Evaluation (Addendum)
Anesthesia Evaluation  Patient identified by MRN, date of birth, ID band Patient awake    Reviewed: Allergy & Precautions, H&P , NPO status , Patient's Chart, lab work & pertinent test results, reviewed documented beta blocker date and time   Airway Mallampati: II  TM Distance: >3 FB Neck ROM: full    Dental  (+) Poor Dentition   Pulmonary Current Smoker, former smoker,    Pulmonary exam normal breath sounds clear to auscultation       Cardiovascular Exercise Tolerance: Good hypertension, + Peripheral Vascular Disease   Rhythm:regular Rate:Normal  EKG from 02/22/20 reviewed   Neuro/Psych  Headaches, CVA, Residual Symptoms negative psych ROS   GI/Hepatic negative GI ROS, Neg liver ROS,   Endo/Other  negative endocrine ROSdiabetes  Renal/GU negative Renal ROS  negative genitourinary   Musculoskeletal  (+) Arthritis ,   Abdominal   Peds  Hematology negative hematology ROS (+)   Anesthesia Other Findings   Reproductive/Obstetrics negative OB ROS                           Anesthesia Physical  Anesthesia Plan  ASA: III  Anesthesia Plan: MAC   Post-op Pain Management:    Induction: Intravenous  PONV Risk Score and Plan: 1 and Propofol infusion, TIVA and Treatment may vary due to age or medical condition  Airway Management Planned: Natural Airway, Simple Face Mask and Nasal Cannula  Additional Equipment:   Intra-op Plan:   Post-operative Plan:   Informed Consent: I have reviewed the patients History and Physical, chart, labs and discussed the procedure including the risks, benefits and alternatives for the proposed anesthesia with the patient or authorized representative who has indicated his/her understanding and acceptance.       Plan Discussed with: Anesthesiologist and CRNA  Anesthesia Plan Comments:       Anesthesia Quick Evaluation

## 2020-03-08 NOTE — Progress Notes (Signed)
Echocardiogram Echocardiogram Transesophageal has been performed.  Darlina Sicilian M 03/08/2020, 11:25 AM

## 2020-03-08 NOTE — Transfer of Care (Signed)
Immediate Anesthesia Transfer of Care Note  Patient: Ryan Butler  Procedure(s) Performed: TRANSESOPHAGEAL ECHOCARDIOGRAM (TEE) (N/A ) BUBBLE STUDY  Patient Location: Endoscopy Unit  Anesthesia Type:MAC  Level of Consciousness: awake, alert  and oriented  Airway & Oxygen Therapy: Patient Spontanous Breathing  Post-op Assessment: Report given to RN and Post -op Vital signs reviewed and stable  Post vital signs: Reviewed and stable  Last Vitals:  Vitals Value Taken Time  BP 144/61 03/08/20 1121  Temp    Pulse 69 03/08/20 1122  Resp 12 03/08/20 1122  SpO2 100 % 03/08/20 1122  Vitals shown include unvalidated device data.  Last Pain:  Vitals:   03/08/20 0923  TempSrc: Temporal  PainSc: 8          Complications: No complications documented.

## 2020-03-08 NOTE — CV Procedure (Signed)
° ° °  Transesophageal Echocardiogram Note  MAKSYM PFIFFNER [id number] 1950/07/06  Procedure: Transesophageal Echocardiogram Indications: CVA , pre- Implantable loop recorder   Procedure Details Consent: Obtained Time Out: Verified patient identification, verified procedure, site/side was marked, verified correct patient position, special equipment/implants available, Radiology Safety Procedures followed,  medications/allergies/relevent history reviewed, required imaging and test results available.  Performed  Medications:  During this procedure the patient is administered a  Propofol drip by Delfino Lovett, CRNA.  Total of 176 mg propofol given. .  The patient's heart rate, blood pressure, and oxygen saturation are monitored continuously during the procedure. The period of conscious sedation is  30  minutes, of which I was present face-to-face 100% of this time.  Left Ventrical:  Normal LV functin  Mitral Valve:  Normal MV   Aortic Valve: 3 leaflet valve   Tricuspid Valve:  Trace TR   Pulmonic Valve: trivial PI   Left Atrium/ Left atrial appendage: no thrombi,   Atrial septum: no ASD or PFO by bubble study or color dopler   Aorta: normal    Complications: No apparent complications Patient did tolerate procedure well.   Thayer Headings, Brooke Bonito., MD, [facility] 03/08/2020, 11:14 AM

## 2020-03-08 NOTE — Interval H&P Note (Signed)
History and Physical Interval Note:  03/08/2020 9:23 AM  Ryan Butler  has presented today for surgery, with the diagnosis of cva.  The various methods of treatment have been discussed with the patient and family. After consideration of risks, benefits and other options for treatment, the patient has consented to  Procedure(s) with comments: TRANSESOPHAGEAL ECHOCARDIOGRAM (TEE) (N/A) - loop after as a surgical intervention.  The patient's history has been reviewed, patient examined, no change in status, stable for surgery.  I have reviewed the patient's chart and labs.  Questions were answered to the patient's satisfaction.     Mertie Moores

## 2020-03-08 NOTE — Telephone Encounter (Signed)
error 

## 2020-03-08 NOTE — Anesthesia Procedure Notes (Signed)
Procedure Name: MAC Date/Time: 03/08/2020 10:54 AM Performed by: Eligha Bridegroom, CRNA Pre-anesthesia Checklist: Patient identified, Emergency Drugs available, Suction available, Patient being monitored and Timeout performed Patient Re-evaluated:Patient Re-evaluated prior to induction Oxygen Delivery Method: Nasal cannula Preoxygenation: Pre-oxygenation with 100% oxygen Induction Type: IV induction

## 2020-03-08 NOTE — Interval H&P Note (Signed)
History and Physical Interval Note:  03/08/2020 10:28 AM  [name]  has presented today for surgery, with the diagnosis of stroke.  The various methods of treatment have been discussed with the patient and family. After consideration of risks, benefits and other options for treatment, the patient has consented to  Procedure(s): LOOP RECORDER INSERTION (N/A) as a surgical intervention.  The patient's history has been reviewed, patient examined, no change in status, stable for surgery.  I have reviewed the patient's chart and labs.  Questions were answered to the patient's satisfaction.     Ryan Butler

## 2020-03-08 NOTE — Telephone Encounter (Signed)
Spoke to the patients daughter. Informing her that whoever she was speaking with is looking at the estimated time for the second procedure of the day, which is a loop implant with Dr. Rayann Heman but this is after his TEE which they seem to be over looking. We discussed the steps that would occur to get everything completed today.   The daughter verbalized understanding.

## 2020-03-09 ENCOUNTER — Encounter (HOSPITAL_COMMUNITY): Payer: Self-pay | Admitting: Internal Medicine

## 2020-03-09 NOTE — Anesthesia Postprocedure Evaluation (Signed)
Anesthesia Post Note  Patient: RANARD HARTE  Procedure(s) Performed: TRANSESOPHAGEAL ECHOCARDIOGRAM (TEE) (N/A ) BUBBLE STUDY     Patient location during evaluation: Endoscopy Anesthesia Type: MAC Level of consciousness: awake and alert Pain management: pain level controlled Vital Signs Assessment: post-procedure vital signs reviewed and stable Respiratory status: spontaneous breathing and respiratory function stable Cardiovascular status: stable Postop Assessment: no apparent nausea or vomiting Anesthetic complications: no   No complications documented.  Last Vitals:  Vitals:   03/08/20 1240 03/08/20 1340  BP: 138/62 (!) 163/66  Pulse: 73 78  Resp: 12 16  Temp:    SpO2: 99% 100%    Last Pain:  Vitals:   03/08/20 1340  TempSrc:   PainSc: 0-No pain   Pain Goal:                   Merlinda Frederick

## 2020-03-11 ENCOUNTER — Encounter (HOSPITAL_COMMUNITY): Payer: Self-pay | Admitting: Cardiovascular Disease

## 2020-03-20 ENCOUNTER — Other Ambulatory Visit: Payer: Self-pay

## 2020-03-20 ENCOUNTER — Ambulatory Visit (INDEPENDENT_AMBULATORY_CARE_PROVIDER_SITE_OTHER): Payer: Medicare HMO | Admitting: Emergency Medicine

## 2020-03-20 DIAGNOSIS — I63011 Cerebral infarction due to thrombosis of right vertebral artery: Secondary | ICD-10-CM

## 2020-03-20 LAB — CUP PACEART INCLINIC DEVICE CHECK
Date Time Interrogation Session: 20211026112937
Implantable Pulse Generator Implant Date: 20211014

## 2020-03-20 NOTE — Progress Notes (Signed)
ILR wound check in clinic. Steri strips removed. Wound well healed. Home monitor transmitting nightly. No episodes. Questions answered.  

## 2020-04-11 ENCOUNTER — Ambulatory Visit (INDEPENDENT_AMBULATORY_CARE_PROVIDER_SITE_OTHER): Payer: Medicare HMO

## 2020-04-11 DIAGNOSIS — I63011 Cerebral infarction due to thrombosis of right vertebral artery: Secondary | ICD-10-CM

## 2020-04-12 LAB — CUP PACEART REMOTE DEVICE CHECK
Date Time Interrogation Session: 20211117122753
Implantable Pulse Generator Implant Date: 20211014

## 2020-04-12 NOTE — Progress Notes (Signed)
[facility] Summary Report / Loop Recorder 

## 2020-05-14 ENCOUNTER — Ambulatory Visit (INDEPENDENT_AMBULATORY_CARE_PROVIDER_SITE_OTHER): Payer: Medicare HMO

## 2020-05-14 DIAGNOSIS — I639 Cerebral infarction, unspecified: Secondary | ICD-10-CM

## 2020-05-15 LAB — CUP PACEART REMOTE DEVICE CHECK
Date Time Interrogation Session: 20211220122842
Implantable Pulse Generator Implant Date: 20211014

## 2020-05-29 NOTE — Progress Notes (Signed)
[facility] Summary Report / Loop Recorder 

## 2020-06-17 LAB — CUP PACEART REMOTE DEVICE CHECK
Date Time Interrogation Session: 20220122122804
Implantable Pulse Generator Implant Date: 20211014

## 2020-06-18 ENCOUNTER — Ambulatory Visit (INDEPENDENT_AMBULATORY_CARE_PROVIDER_SITE_OTHER): Payer: Medicare HMO

## 2020-06-18 DIAGNOSIS — I639 Cerebral infarction, unspecified: Secondary | ICD-10-CM

## 2020-06-28 NOTE — Progress Notes (Signed)
[facility] Summary Report / Loop Recorder 

## 2020-07-21 LAB — CUP PACEART REMOTE DEVICE CHECK
Date Time Interrogation Session: 20220224123229
Implantable Pulse Generator Implant Date: 20211014

## 2020-07-23 ENCOUNTER — Ambulatory Visit (INDEPENDENT_AMBULATORY_CARE_PROVIDER_SITE_OTHER): Payer: Medicare HMO

## 2020-07-23 DIAGNOSIS — I639 Cerebral infarction, unspecified: Secondary | ICD-10-CM | POA: Diagnosis not present

## 2020-07-30 NOTE — Progress Notes (Signed)
[facility] Summary Report / Loop Recorder 

## 2020-08-22 ENCOUNTER — Ambulatory Visit (INDEPENDENT_AMBULATORY_CARE_PROVIDER_SITE_OTHER): Payer: Medicare HMO

## 2020-08-22 DIAGNOSIS — I639 Cerebral infarction, unspecified: Secondary | ICD-10-CM

## 2020-08-23 LAB — CUP PACEART REMOTE DEVICE CHECK
Date Time Interrogation Session: 20220329123222
Implantable Pulse Generator Implant Date: 20211014

## 2020-09-04 NOTE — Progress Notes (Signed)
[facility] Summary Report / Loop Recorder 

## 2020-09-24 ENCOUNTER — Ambulatory Visit (INDEPENDENT_AMBULATORY_CARE_PROVIDER_SITE_OTHER): Payer: Medicare HMO

## 2020-09-24 DIAGNOSIS — I63011 Cerebral infarction due to thrombosis of right vertebral artery: Secondary | ICD-10-CM

## 2020-09-25 LAB — CUP PACEART REMOTE DEVICE CHECK
Date Time Interrogation Session: 20220501123053
Implantable Pulse Generator Implant Date: 20211014

## 2020-10-16 NOTE — Progress Notes (Signed)
[facility] Summary Report / Loop Recorder 

## 2020-10-28 LAB — CUP PACEART REMOTE DEVICE CHECK
Date Time Interrogation Session: 20220603123225
Implantable Pulse Generator Implant Date: 20211014

## 2020-10-29 ENCOUNTER — Ambulatory Visit (INDEPENDENT_AMBULATORY_CARE_PROVIDER_SITE_OTHER): Payer: Medicare HMO

## 2020-10-29 DIAGNOSIS — I63011 Cerebral infarction due to thrombosis of right vertebral artery: Secondary | ICD-10-CM | POA: Diagnosis not present

## 2020-11-20 NOTE — Progress Notes (Signed)
[facility] Summary Report / Loop Recorder 

## 2020-12-03 ENCOUNTER — Ambulatory Visit (INDEPENDENT_AMBULATORY_CARE_PROVIDER_SITE_OTHER): Payer: Medicare HMO

## 2020-12-03 DIAGNOSIS — I63011 Cerebral infarction due to thrombosis of right vertebral artery: Secondary | ICD-10-CM | POA: Diagnosis not present

## 2020-12-03 LAB — CUP PACEART REMOTE DEVICE CHECK
Date Time Interrogation Session: 20220706123301
Implantable Pulse Generator Implant Date: 20211014

## 2020-12-26 NOTE — Progress Notes (Signed)
[facility] Summary Report / Loop Recorder 

## 2021-01-07 ENCOUNTER — Ambulatory Visit (INDEPENDENT_AMBULATORY_CARE_PROVIDER_SITE_OTHER): Payer: Medicare HMO

## 2021-01-07 DIAGNOSIS — I63011 Cerebral infarction due to thrombosis of right vertebral artery: Secondary | ICD-10-CM | POA: Diagnosis not present

## 2021-01-08 LAB — CUP PACEART REMOTE DEVICE CHECK
Date Time Interrogation Session: 20220808122920
Implantable Pulse Generator Implant Date: 20211014

## 2021-01-26 NOTE — Progress Notes (Signed)
[facility] Summary Report / Loop Recorder 

## 2021-02-07 LAB — CUP PACEART REMOTE DEVICE CHECK
Date Time Interrogation Session: 20220910123148
Implantable Pulse Generator Implant Date: 20211014

## 2021-02-11 ENCOUNTER — Ambulatory Visit (INDEPENDENT_AMBULATORY_CARE_PROVIDER_SITE_OTHER): Payer: Medicare HMO

## 2021-02-11 DIAGNOSIS — I63011 Cerebral infarction due to thrombosis of right vertebral artery: Secondary | ICD-10-CM | POA: Diagnosis not present

## 2021-02-18 NOTE — Progress Notes (Signed)
[facility] Summary Report / Loop Recorder 

## 2021-03-11 ENCOUNTER — Ambulatory Visit (INDEPENDENT_AMBULATORY_CARE_PROVIDER_SITE_OTHER): Payer: Medicare HMO

## 2021-03-11 DIAGNOSIS — I63011 Cerebral infarction due to thrombosis of right vertebral artery: Secondary | ICD-10-CM | POA: Diagnosis not present

## 2021-03-13 LAB — CUP PACEART REMOTE DEVICE CHECK
Date Time Interrogation Session: 20221013123326
Implantable Pulse Generator Implant Date: 20211014

## 2021-03-20 NOTE — Progress Notes (Signed)
[facility] Summary Report / Loop Recorder 

## 2021-03-28 DIAGNOSIS — Z1211 Encounter for screening for malignant neoplasm of colon: Secondary | ICD-10-CM | POA: Diagnosis not present

## 2021-04-06 LAB — COLOGUARD: COLOGUARD: POSITIVE — AB

## 2021-04-10 LAB — CUP PACEART REMOTE DEVICE CHECK
Date Time Interrogation Session: 20221115123108
Implantable Pulse Generator Implant Date: 20211014

## 2021-04-15 ENCOUNTER — Encounter: Payer: Self-pay | Admitting: Internal Medicine

## 2021-04-15 ENCOUNTER — Ambulatory Visit (INDEPENDENT_AMBULATORY_CARE_PROVIDER_SITE_OTHER): Payer: Medicare HMO

## 2021-04-15 DIAGNOSIS — I63011 Cerebral infarction due to thrombosis of right vertebral artery: Secondary | ICD-10-CM | POA: Diagnosis not present

## 2021-04-17 DIAGNOSIS — M5136 Other intervertebral disc degeneration, lumbar region: Secondary | ICD-10-CM | POA: Diagnosis not present

## 2021-04-17 DIAGNOSIS — F17200 Nicotine dependence, unspecified, uncomplicated: Secondary | ICD-10-CM | POA: Diagnosis not present

## 2021-04-17 DIAGNOSIS — E119 Type 2 diabetes mellitus without complications: Secondary | ICD-10-CM | POA: Diagnosis not present

## 2021-04-17 DIAGNOSIS — Z0000 Encounter for general adult medical examination without abnormal findings: Secondary | ICD-10-CM | POA: Diagnosis not present

## 2021-04-17 DIAGNOSIS — R030 Elevated blood-pressure reading, without diagnosis of hypertension: Secondary | ICD-10-CM | POA: Diagnosis not present

## 2021-04-17 DIAGNOSIS — F17210 Nicotine dependence, cigarettes, uncomplicated: Secondary | ICD-10-CM | POA: Diagnosis not present

## 2021-04-17 DIAGNOSIS — Z6821 Body mass index (BMI) 21.0-21.9, adult: Secondary | ICD-10-CM | POA: Diagnosis not present

## 2021-04-17 DIAGNOSIS — Z79899 Other long term (current) drug therapy: Secondary | ICD-10-CM | POA: Diagnosis not present

## 2021-04-17 DIAGNOSIS — G894 Chronic pain syndrome: Secondary | ICD-10-CM | POA: Diagnosis not present

## 2021-04-24 NOTE — Progress Notes (Signed)
[facility] Summary Report / Loop Recorder 

## 2021-05-15 LAB — CUP PACEART REMOTE DEVICE CHECK
Date Time Interrogation Session: 20221218123036
Implantable Pulse Generator Implant Date: 20211014

## 2021-05-22 ENCOUNTER — Ambulatory Visit (INDEPENDENT_AMBULATORY_CARE_PROVIDER_SITE_OTHER): Payer: Medicare HMO

## 2021-05-22 DIAGNOSIS — I63011 Cerebral infarction due to thrombosis of right vertebral artery: Secondary | ICD-10-CM | POA: Diagnosis not present

## 2021-05-24 DIAGNOSIS — F17200 Nicotine dependence, unspecified, uncomplicated: Secondary | ICD-10-CM | POA: Diagnosis not present

## 2021-05-24 DIAGNOSIS — Z79899 Other long term (current) drug therapy: Secondary | ICD-10-CM | POA: Diagnosis not present

## 2021-05-24 DIAGNOSIS — F17210 Nicotine dependence, cigarettes, uncomplicated: Secondary | ICD-10-CM | POA: Diagnosis not present

## 2021-05-24 DIAGNOSIS — F1120 Opioid dependence, uncomplicated: Secondary | ICD-10-CM | POA: Diagnosis not present

## 2021-05-24 DIAGNOSIS — E119 Type 2 diabetes mellitus without complications: Secondary | ICD-10-CM | POA: Diagnosis not present

## 2021-05-24 DIAGNOSIS — G894 Chronic pain syndrome: Secondary | ICD-10-CM | POA: Diagnosis not present

## 2021-05-24 DIAGNOSIS — R030 Elevated blood-pressure reading, without diagnosis of hypertension: Secondary | ICD-10-CM | POA: Diagnosis not present

## 2021-05-24 DIAGNOSIS — M5136 Other intervertebral disc degeneration, lumbar region: Secondary | ICD-10-CM | POA: Diagnosis not present

## 2021-06-04 NOTE — Progress Notes (Signed)
[facility] Summary Report / Loop Recorder 

## 2021-06-24 ENCOUNTER — Ambulatory Visit (INDEPENDENT_AMBULATORY_CARE_PROVIDER_SITE_OTHER): Payer: Medicare HMO

## 2021-06-24 DIAGNOSIS — Z79899 Other long term (current) drug therapy: Secondary | ICD-10-CM | POA: Diagnosis not present

## 2021-06-24 DIAGNOSIS — R030 Elevated blood-pressure reading, without diagnosis of hypertension: Secondary | ICD-10-CM | POA: Diagnosis not present

## 2021-06-24 DIAGNOSIS — M5136 Other intervertebral disc degeneration, lumbar region: Secondary | ICD-10-CM | POA: Diagnosis not present

## 2021-06-24 DIAGNOSIS — I63011 Cerebral infarction due to thrombosis of right vertebral artery: Secondary | ICD-10-CM | POA: Diagnosis not present

## 2021-06-24 DIAGNOSIS — F17210 Nicotine dependence, cigarettes, uncomplicated: Secondary | ICD-10-CM | POA: Diagnosis not present

## 2021-06-24 DIAGNOSIS — G894 Chronic pain syndrome: Secondary | ICD-10-CM | POA: Diagnosis not present

## 2021-06-24 DIAGNOSIS — F17200 Nicotine dependence, unspecified, uncomplicated: Secondary | ICD-10-CM | POA: Diagnosis not present

## 2021-06-24 DIAGNOSIS — Z0000 Encounter for general adult medical examination without abnormal findings: Secondary | ICD-10-CM | POA: Diagnosis not present

## 2021-06-24 DIAGNOSIS — E119 Type 2 diabetes mellitus without complications: Secondary | ICD-10-CM | POA: Diagnosis not present

## 2021-06-24 LAB — CUP PACEART REMOTE DEVICE CHECK
Date Time Interrogation Session: 20230129230736
Implantable Pulse Generator Implant Date: 20211014

## 2021-06-26 DIAGNOSIS — Z79899 Other long term (current) drug therapy: Secondary | ICD-10-CM | POA: Diagnosis not present

## 2021-07-02 NOTE — Progress Notes (Signed)
[facility] Summary Report / Loop Recorder 

## 2021-07-23 DIAGNOSIS — F17200 Nicotine dependence, unspecified, uncomplicated: Secondary | ICD-10-CM | POA: Diagnosis not present

## 2021-07-23 DIAGNOSIS — E119 Type 2 diabetes mellitus without complications: Secondary | ICD-10-CM | POA: Diagnosis not present

## 2021-07-23 DIAGNOSIS — R030 Elevated blood-pressure reading, without diagnosis of hypertension: Secondary | ICD-10-CM | POA: Diagnosis not present

## 2021-07-23 DIAGNOSIS — M5136 Other intervertebral disc degeneration, lumbar region: Secondary | ICD-10-CM | POA: Diagnosis not present

## 2021-07-23 DIAGNOSIS — F17210 Nicotine dependence, cigarettes, uncomplicated: Secondary | ICD-10-CM | POA: Diagnosis not present

## 2021-07-23 DIAGNOSIS — Z79899 Other long term (current) drug therapy: Secondary | ICD-10-CM | POA: Diagnosis not present

## 2021-07-23 DIAGNOSIS — G894 Chronic pain syndrome: Secondary | ICD-10-CM | POA: Diagnosis not present

## 2021-07-29 ENCOUNTER — Ambulatory Visit (INDEPENDENT_AMBULATORY_CARE_PROVIDER_SITE_OTHER): Payer: Medicare HMO

## 2021-07-29 DIAGNOSIS — I63011 Cerebral infarction due to thrombosis of right vertebral artery: Secondary | ICD-10-CM

## 2021-07-30 LAB — CUP PACEART REMOTE DEVICE CHECK
Date Time Interrogation Session: 20230303230804
Implantable Pulse Generator Implant Date: 20211014

## 2021-08-09 NOTE — Progress Notes (Signed)
Carelink Summary Report / [email or web address] 

## 2021-08-20 ENCOUNTER — Ambulatory Visit: Payer: Medicare HMO | Admitting: Gastroenterology

## 2021-08-20 DIAGNOSIS — F17210 Nicotine dependence, cigarettes, uncomplicated: Secondary | ICD-10-CM | POA: Diagnosis not present

## 2021-08-20 DIAGNOSIS — M5136 Other intervertebral disc degeneration, lumbar region: Secondary | ICD-10-CM | POA: Diagnosis not present

## 2021-08-20 DIAGNOSIS — E559 Vitamin D deficiency, unspecified: Secondary | ICD-10-CM | POA: Diagnosis not present

## 2021-08-20 DIAGNOSIS — E119 Type 2 diabetes mellitus without complications: Secondary | ICD-10-CM | POA: Diagnosis not present

## 2021-08-20 DIAGNOSIS — Z6820 Body mass index (BMI) 20.0-20.9, adult: Secondary | ICD-10-CM | POA: Diagnosis not present

## 2021-08-20 DIAGNOSIS — G894 Chronic pain syndrome: Secondary | ICD-10-CM | POA: Diagnosis not present

## 2021-08-20 DIAGNOSIS — Z79899 Other long term (current) drug therapy: Secondary | ICD-10-CM | POA: Diagnosis not present

## 2021-08-20 DIAGNOSIS — R6889 Other general symptoms and signs: Secondary | ICD-10-CM | POA: Diagnosis not present

## 2021-08-20 DIAGNOSIS — F1120 Opioid dependence, uncomplicated: Secondary | ICD-10-CM | POA: Diagnosis not present

## 2021-08-20 DIAGNOSIS — F17200 Nicotine dependence, unspecified, uncomplicated: Secondary | ICD-10-CM | POA: Diagnosis not present

## 2021-08-20 DIAGNOSIS — R030 Elevated blood-pressure reading, without diagnosis of hypertension: Secondary | ICD-10-CM | POA: Diagnosis not present

## 2021-08-23 DIAGNOSIS — Z139 Encounter for screening, unspecified: Secondary | ICD-10-CM | POA: Diagnosis not present

## 2021-08-23 DIAGNOSIS — E1122 Type 2 diabetes mellitus with diabetic chronic kidney disease: Secondary | ICD-10-CM | POA: Diagnosis not present

## 2021-08-27 DIAGNOSIS — Z0001 Encounter for general adult medical examination with abnormal findings: Secondary | ICD-10-CM | POA: Diagnosis not present

## 2021-08-27 DIAGNOSIS — G8929 Other chronic pain: Secondary | ICD-10-CM | POA: Diagnosis not present

## 2021-08-27 DIAGNOSIS — N184 Chronic kidney disease, stage 4 (severe): Secondary | ICD-10-CM | POA: Diagnosis not present

## 2021-08-27 DIAGNOSIS — Z8673 Personal history of transient ischemic attack (TIA), and cerebral infarction without residual deficits: Secondary | ICD-10-CM | POA: Diagnosis not present

## 2021-08-27 DIAGNOSIS — I639 Cerebral infarction, unspecified: Secondary | ICD-10-CM | POA: Diagnosis not present

## 2021-08-27 DIAGNOSIS — E1122 Type 2 diabetes mellitus with diabetic chronic kidney disease: Secondary | ICD-10-CM | POA: Diagnosis not present

## 2021-08-27 DIAGNOSIS — G4700 Insomnia, unspecified: Secondary | ICD-10-CM | POA: Diagnosis not present

## 2021-08-27 DIAGNOSIS — I129 Hypertensive chronic kidney disease with stage 1 through stage 4 chronic kidney disease, or unspecified chronic kidney disease: Secondary | ICD-10-CM | POA: Diagnosis not present

## 2021-08-27 DIAGNOSIS — E782 Mixed hyperlipidemia: Secondary | ICD-10-CM | POA: Diagnosis not present

## 2021-08-28 ENCOUNTER — Other Ambulatory Visit: Payer: Self-pay | Admitting: Family Medicine

## 2021-08-28 ENCOUNTER — Other Ambulatory Visit (HOSPITAL_COMMUNITY): Payer: Self-pay | Admitting: Family Medicine

## 2021-08-28 ENCOUNTER — Ambulatory Visit (HOSPITAL_COMMUNITY)
Admission: RE | Admit: 2021-08-28 | Discharge: 2021-08-28 | Disposition: A | Discharge status: Home / Self Care | Payer: Medicare HMO | Source: Ambulatory Visit | Attending: Family Medicine | Admitting: Family Medicine

## 2021-08-28 DIAGNOSIS — I672 Cerebral atherosclerosis: Secondary | ICD-10-CM | POA: Diagnosis not present

## 2021-08-28 DIAGNOSIS — Z122 Encounter for screening for malignant neoplasm of respiratory organs: Secondary | ICD-10-CM

## 2021-08-28 DIAGNOSIS — Z8673 Personal history of transient ischemic attack (TIA), and cerebral infarction without residual deficits: Secondary | ICD-10-CM | POA: Diagnosis not present

## 2021-08-28 DIAGNOSIS — I639 Cerebral infarction, unspecified: Secondary | ICD-10-CM | POA: Diagnosis not present

## 2021-08-28 DIAGNOSIS — I6523 Occlusion and stenosis of bilateral carotid arteries: Secondary | ICD-10-CM | POA: Diagnosis not present

## 2021-09-02 ENCOUNTER — Ambulatory Visit (INDEPENDENT_AMBULATORY_CARE_PROVIDER_SITE_OTHER): Payer: Medicare HMO

## 2021-09-02 DIAGNOSIS — I63011 Cerebral infarction due to thrombosis of right vertebral artery: Secondary | ICD-10-CM

## 2021-09-04 LAB — CUP PACEART REMOTE DEVICE CHECK
Date Time Interrogation Session: 20230405230634
Implantable Pulse Generator Implant Date: 20211014

## 2021-09-17 ENCOUNTER — Ambulatory Visit: Payer: Medicare HMO | Admitting: Gastroenterology

## 2021-09-19 ENCOUNTER — Encounter: Payer: Self-pay | Admitting: *Deleted

## 2021-09-19 DIAGNOSIS — R6889 Other general symptoms and signs: Secondary | ICD-10-CM | POA: Diagnosis not present

## 2021-09-19 DIAGNOSIS — G894 Chronic pain syndrome: Secondary | ICD-10-CM | POA: Diagnosis not present

## 2021-09-19 DIAGNOSIS — Z79899 Other long term (current) drug therapy: Secondary | ICD-10-CM | POA: Diagnosis not present

## 2021-09-19 NOTE — Progress Notes (Signed)
[facility] Summary Report / Loop Recorder 

## 2021-09-23 ENCOUNTER — Ambulatory Visit: Payer: Medicare HMO | Admitting: Diagnostic Neuroimaging

## 2021-09-23 ENCOUNTER — Encounter: Payer: Self-pay | Admitting: Diagnostic Neuroimaging

## 2021-09-23 VITALS — BP 133/72 | HR 78 | Ht 70.0 in | Wt 152.4 lb

## 2021-09-23 DIAGNOSIS — F03A2 Unspecified dementia, mild, with psychotic disturbance: Secondary | ICD-10-CM | POA: Diagnosis not present

## 2021-09-23 NOTE — Progress Notes (Signed)
? ?[facility] ? ?PATIENT: Ryan Butler ?DOB: [birth year] ? ?REFERRING CLINICIAN: Valentino Nose, FNP ?HISTORY FROM: patient  ?REASON FOR VISIT: follow up ? ? ?HISTORICAL ? ?CHIEF COMPLAINT:  ?Chief Complaint  ?Patient presents with  ? Delusions  ?  Rm 6 , est pt with new issue, dgtr- [name]  MMSE 22  ? ? ?HISTORY OF PRESENT ILLNESS:  ? ?UPDATE (09/23/21, VRP): Since last visit, doing well until 1-2 months ago. Some paranoia, fear, issues. Had CT head, and now on seroquel, and doing better. Was living alone, then lived with son, then with his brother, now back to his own home x 1 week.  ? ?PRIOR HPI (01/17/20): 71 year old male here for evaluation of stroke.  History of hypertension, seizures, substance abuse, diabetes, hypercholesterolemia, right MCA stroke in May 2018, left carotid endarterectomy, here for evaluation of right PCA stroke.  ? ?Patient had onset of visual changes and loss since 12/24/2019.  Patient had MRI of the brain as an outpatient and was referred to the hospital for acute right PCA stroke.  Stroke work-up was completed in the hospital.  Patient was recommended follow-up in neurology clinic. ? ?Since that time continues to have left visual field deficits.  He has stopped smoking since hospitalization in August 2021. ? ? ? ?REVIEW OF SYSTEMS: Full 14 system review of systems performed and negative with exception of: As per HPI. ? ?ALLERGIES: ?Allergies  ?Allergen Reactions  ? Aspirin Nausea And Vomiting  ? Hydrocodone Nausea Only  ? ? ?HOME MEDICATIONS: ?Outpatient Medications Prior to Visit  ?Medication Sig Dispense Refill  ? ACCU-CHEK AVIVA PLUS test strip USE ONE STRIP TO CHECK GLUCOSE TWICE DAILY 90 each 1  ? ACCU-CHEK SOFTCLIX LANCETS lancets USE ONE LANCET TWICE DAILY AS DIRECTED TO CHECK BLOOD GLUCOSE 90 each 1  ? amitriptyline (ELAVIL) 150 MG tablet TAKE ONE TABLET BY MOUTH AT BEDTIME AS NEEDED FOR SLEEP (Patient taking differently: Take 150 mg by mouth at bedtime as  needed for sleep.) 90 tablet 1  ? atorvastatin (LIPITOR) 80 MG tablet Take 1 tablet (80 mg total) by mouth daily at 6 PM. 30 tablet 1  ? baclofen (LIORESAL) 10 MG tablet Take 1 tablet (10 mg total) by mouth 2 (two) times daily as needed for muscle spasms. 30 each 0  ? clopidogrel (PLAVIX) 75 MG tablet Take 1 tablet (75 mg total) by mouth daily with breakfast. 30 tablet 11  ? hydrochlorothiazide (HYDRODIURIL) 25 MG tablet Take 25 mg by mouth daily.    ? ibuprofen (ADVIL) 200 MG tablet Take 400 mg by mouth every 4 (four) hours as needed for moderate pain.    ? losartan (COZAAR) 100 MG tablet Take 100 mg by mouth daily.    ? metFORMIN (GLUCOPHAGE-XR) 500 MG 24 hr tablet Take 1 tablet (500 mg total) by mouth daily with breakfast. 60 tablet 2  ? Multiple Vitamins-Minerals (MULTIVITAMIN WITH MINERALS) tablet Take 1 tablet by mouth daily.    ? Oxycodone HCl 10 MG TABS Take 10 mg by mouth 4 (four) times daily.   0  ? pantoprazole (PROTONIX) 40 MG tablet Take 1 tablet (40 mg total) by mouth daily. 30 tablet 2  ? QUEtiapine (SEROQUEL) 25 MG tablet Take 25 mg by mouth at bedtime.    ? ?No facility-administered medications prior to visit.  ? ? ?PAST MEDICAL HISTORY: ?Past Medical History:  ?Diagnosis Date  ? Carotid artery disease ([place])   ? Cervical spondylosis   ? Closed head injury  1996  ? Cognitive impairment   ? Essential hypertension, benign   ? History of colonic polyps   ? History of scarlet fever   ? History of seizures   ? unknown etiology and on no meds; last seizure was 2011  ? History of TIAs   ? Hyperlipidemia   ? Insomnia   ? Paranoia ([place])   ? Polysubstance abuse ([place])   ? History of cocaine in his 24's  ? Stroke [facility]) 12/2019  ? hx of strokes per pt, weakness of left side  ? Type 2 diabetes mellitus ([place])   ? Pt stopped metformin 09/2013  ? Vision abnormalities   ? no peripheral vision in left eye  ? ? ?PAST SURGICAL HISTORY: ?Past Surgical History:  ?Procedure Laterality Date  ? ANTERIOR CERVICAL  DECOMP/DISCECTOMY FUSION N/A 12/05/2013  ? Procedure: ANTERIOR CERVICAL DECOMPRESSION/DISCECTOMY FUSION 2 LEVELS;  Surgeon: Elaina Hoops, MD;  Location: [place] [facility];  Service: Neurosurgery;  Laterality: N/A;  Anterior Cervical Discectomy and Fusion with PEEK Cages and allograft Cervical Four-Five/Five-Six  ? BUBBLE STUDY  03/08/2020  ? Procedure: BUBBLE STUDY;  Surgeon: Thayer Headings, MD;  Location: [place];  Service: Cardiovascular;;  ? CATARACT EXTRACTION W/PHACO Left 05/24/2018  ? Procedure: CATARACT EXTRACTION PHACO AND INTRAOCULAR LENS PLACEMENT (IOC);  Surgeon: Tonny Branch, MD;  Location: [facility];  Service: Ophthalmology;  Laterality: Left;  CDE: 7.67  ? CATARACT EXTRACTION W/PHACO Right 11/21/2019  ? Procedure: CATARACT EXTRACTION PHACO AND INTRAOCULAR LENS PLACEMENT RIGHT EYE;  Surgeon: Baruch Goldmann, MD;  Location: [facility];  Service: Ophthalmology;  Laterality: Right;  CDE: 6.26  ? FRACTURE SURGERY    ? Hand laceration repair Left   ? Left carotid endarterectomy    ? 6/06 - Dr. Scot Dock  ? LOOP RECORDER INSERTION N/A 03/08/2020  ? Procedure: LOOP RECORDER INSERTION;  Surgeon: Thompson Grayer, MD;  Location: [place] [facility];  Service: Cardiovascular;  Laterality: N/A;  ? skin graft to leg Left   ? TEE WITHOUT CARDIOVERSION N/A 03/08/2020  ? Procedure: TRANSESOPHAGEAL ECHOCARDIOGRAM (TEE);  Surgeon: Acie Fredrickson Wonda Cheng, MD;  Location: [place];  Service: Cardiovascular;  Laterality: N/A;  loop after  ? Tibia/fibula fracture repair Left   ? VASCULAR SURGERY    ? ? ?FAMILY HISTORY: ?Family History  ?Problem Relation Age of Onset  ? Cancer Father   ? Coronary artery disease Mother   ? Diabetes Mother   ? Fibromyalgia Sister   ? Cancer Brother   ? Cancer Brother   ? ? ?SOCIAL HISTORY: ?Social History  ? ?Socioeconomic History  ? Marital status: Divorced  ?  Spouse name: Not on file  ? Number of children: 2  ? Years of education: 36  ? Highest education level: 9th grade  ?Occupational History  ? Not on file   ?Tobacco Use  ? Smoking status: Every Day  ?  Packs/day: 0.75  ?  Years: 60.00  ?  Pack years: 45.00  ?  Types: Cigarettes  ?  Last attempt to quit: 12/17/2019  ?  Years since quitting: 1.7  ? Smokeless tobacco: Never  ? Tobacco comments:  ?  smokes 3/4 pack per day now 09/19/21  ?Vaping Use  ? Vaping Use: Never used  ?Substance and Sexual Activity  ? Alcohol use: Yes  ?  Comment: seldom, 09/19/21 none  ? Drug use: Yes  ?  Types: Marijuana, Oxycodone  ?  Comment: 01/17/20 uses daily; 09/23/21 once a day  ? Sexual activity:  Not Currently  ?  Birth control/protection: None  ?Other Topics Concern  ? Not on file  ?Social History Narrative  ? 09/19/21 lives alone  ? Caffeine- heavy use  ? ?Social Determinants of Health  ? ?Financial Resource Strain: Not on file  ?Food Insecurity: Not on file  ?Transportation Needs: Not on file  ?Physical Activity: Not on file  ?Stress: Not on file  ?Social Connections: Not on file  ?Intimate Partner Violence: Not on file  ? ? ? ?PHYSICAL EXAM ? ?GENERAL EXAM/CONSTITUTIONAL: ?Vitals:  ?Vitals:  ? 09/23/21 1337  ?BP: 133/72  ?Pulse: 78  ?Weight: 152 lb 6.4 oz (69.1 kg)  ?Height: $RemoveB'5\' 10"'gnRYronX$  (1.778 m)  ? ?Body mass index is 21.87 kg/m?. ?Wt Readings from Last 3 Encounters:  ?09/23/21 152 lb 6.4 oz (69.1 kg)  ?03/08/20 150 lb (68 kg)  ?02/22/20 137 lb 3.2 oz (62.2 kg)  ? ?Patient is in no distress; well developed, nourished and groomed; neck is supple ? ?CARDIOVASCULAR: ?Examination of carotid arteries is normal; no carotid bruits ?Regular rate and rhythm, no murmurs ?Examination of peripheral vascular system by observation and palpation is normal ? ?EYES: ?Ophthalmoscopic exam of optic discs and posterior segments is normal; no papilledema or hemorrhages ?No results found. ? ?MUSCULOSKELETAL: ?Gait, strength, tone, movements noted in Neurologic exam below ? ?NEUROLOGIC: ?MENTAL STATUS:  ? ?  09/23/2021  ?  1:46 PM  ?MMSE - Mini Mental State Exam  ?Orientation to time 4  ?Orientation to Place 4   ?Registration 3  ?Attention/ Calculation 1  ?Recall 2  ?Language- name 2 objects 2  ?Language- repeat 0  ?Language- follow 3 step command 3  ?Language- read & follow direction 1  ?Write a sentence 1  ?Copy design 1  ?Total score

## 2021-09-24 DIAGNOSIS — Z79899 Other long term (current) drug therapy: Secondary | ICD-10-CM | POA: Diagnosis not present

## 2021-10-01 ENCOUNTER — Ambulatory Visit (HOSPITAL_COMMUNITY): Payer: Medicare HMO

## 2021-10-07 ENCOUNTER — Ambulatory Visit (INDEPENDENT_AMBULATORY_CARE_PROVIDER_SITE_OTHER): Payer: Medicare HMO

## 2021-10-07 DIAGNOSIS — I63011 Cerebral infarction due to thrombosis of right vertebral artery: Secondary | ICD-10-CM

## 2021-10-07 DIAGNOSIS — E113291 Type 2 diabetes mellitus with mild nonproliferative diabetic retinopathy without macular edema, right eye: Secondary | ICD-10-CM | POA: Diagnosis not present

## 2021-10-09 LAB — CUP PACEART REMOTE DEVICE CHECK
Date Time Interrogation Session: 20230508231201
Implantable Pulse Generator Implant Date: 20211014

## 2021-10-14 DIAGNOSIS — R6889 Other general symptoms and signs: Secondary | ICD-10-CM | POA: Diagnosis not present

## 2021-10-14 DIAGNOSIS — M5136 Other intervertebral disc degeneration, lumbar region: Secondary | ICD-10-CM | POA: Diagnosis not present

## 2021-10-14 DIAGNOSIS — Z6820 Body mass index (BMI) 20.0-20.9, adult: Secondary | ICD-10-CM | POA: Diagnosis not present

## 2021-10-14 DIAGNOSIS — Z79899 Other long term (current) drug therapy: Secondary | ICD-10-CM | POA: Diagnosis not present

## 2021-10-14 DIAGNOSIS — R030 Elevated blood-pressure reading, without diagnosis of hypertension: Secondary | ICD-10-CM | POA: Diagnosis not present

## 2021-10-14 DIAGNOSIS — G894 Chronic pain syndrome: Secondary | ICD-10-CM | POA: Diagnosis not present

## 2021-10-15 ENCOUNTER — Ambulatory Visit: Payer: Medicare HMO | Admitting: Gastroenterology

## 2021-10-15 ENCOUNTER — Encounter: Payer: Self-pay | Admitting: Gastroenterology

## 2021-10-15 DIAGNOSIS — R195 Other fecal abnormalities: Secondary | ICD-10-CM | POA: Diagnosis not present

## 2021-10-15 NOTE — Progress Notes (Signed)
GI Office Note    Referring Provider: Celene Squibb, MD Primary Care Physician:  Celene Squibb, MD  Primary Gastroenterologist: Garfield Cornea, MD   Chief Complaint   Chief Complaint  Patient presents with   Colonoscopy    Positive cologuard.      History of Present Illness   Ryan Butler is a 71 y.o. male presenting today at the request of Dr. Nevada Crane for positive Cologuard. Patient's PMH significant for CVAs, last one in 2021. Has residual left eye vision changes and left hand weakness. Also with history of vascular dementia, chronic back/neck pain, HTN, DM, PVD, implanted loop recorder due to history of unexplained strokes.   Patient presents with daughter today. Notable weight loss over the past one year, unclear how much as no weights from last year available.  Not always eating well. Drinks plenty of water. No abdominal pain. He denies bowel concerns. No melena, brbpr. No n/v. No dysphagia. H/o colon polyps, in 2007 he had tubulovillous adenoma. He had tubular adenoma removed in 2010.   EGD 04/2002: Dr. Amedeo Plenty -moderate antral gastritis  Medications   Current Outpatient Medications  Medication Sig Dispense Refill   ACCU-CHEK AVIVA PLUS test strip USE ONE STRIP TO CHECK GLUCOSE TWICE DAILY 90 each 1   ACCU-CHEK SOFTCLIX LANCETS lancets USE ONE LANCET TWICE DAILY AS DIRECTED TO CHECK BLOOD GLUCOSE 90 each 1   amitriptyline (ELAVIL) 150 MG tablet TAKE ONE TABLET BY MOUTH AT BEDTIME AS NEEDED FOR SLEEP (Patient taking differently: Take 150 mg by mouth at bedtime as needed for sleep.) 90 tablet 1   atorvastatin (LIPITOR) 80 MG tablet Take 1 tablet (80 mg total) by mouth daily at 6 PM. 30 tablet 1   baclofen (LIORESAL) 10 MG tablet Take 1 tablet (10 mg total) by mouth 2 (two) times daily as needed for muscle spasms. 30 each 0   clopidogrel (PLAVIX) 75 MG tablet Take 1 tablet (75 mg total) by mouth daily with breakfast. 30 tablet 11   hydrochlorothiazide (HYDRODIURIL) 25 MG  tablet Take 25 mg by mouth daily.     ibuprofen (ADVIL) 200 MG tablet Take 400 mg by mouth every 4 (four) hours as needed for moderate pain.     losartan (COZAAR) 100 MG tablet Take 100 mg by mouth daily.     Oxycodone HCl 10 MG TABS Take 10 mg by mouth 4 (four) times daily.   0   QUEtiapine (SEROQUEL) 25 MG tablet Take 25 mg by mouth at bedtime.     glipiZIDE (GLUCOTROL XL) 5 MG 24 hr tablet Take 5 mg by mouth daily.     No current facility-administered medications for this visit.    Allergies   Allergies as of 10/15/2021 - Review Complete 10/15/2021  Allergen Reaction Noted   Aspirin Nausea And Vomiting 01/08/2017   Hydrocodone Nausea Only 04/16/2015    Past Medical History   Past Medical History:  Diagnosis Date   Carotid artery disease ([place])    Cervical spondylosis    Closed head injury 1996   Cognitive impairment    Essential hypertension, benign    History of colonic polyps    History of scarlet fever    History of seizures    unknown etiology and on no meds; last seizure was 2011   History of TIAs    Hyperlipidemia    Insomnia    Paranoia ([place])    Polysubstance abuse ([place])    History of cocaine in  his 55's   Stroke ([place]) 12/2019   hx of strokes per pt, weakness of left side   Type 2 diabetes mellitus ([place])    Pt stopped metformin 09/2013   Vision abnormalities    no peripheral vision in left eye    Past Surgical History   Past Surgical History:  Procedure Laterality Date   ANTERIOR CERVICAL DECOMP/DISCECTOMY FUSION N/A 12/05/2013   Procedure: ANTERIOR CERVICAL DECOMPRESSION/DISCECTOMY FUSION 2 LEVELS;  Surgeon: Elaina Hoops, MD;  Location: [place] [facility];  Service: Neurosurgery;  Laterality: N/A;  Anterior Cervical Discectomy and Fusion with PEEK Cages and allograft Cervical Four-Five/Five-Six   BUBBLE STUDY  03/08/2020   Procedure: BUBBLE STUDY;  Surgeon: Thayer Headings, MD;  [facility];  Service: Cardiovascular;;   CATARACT EXTRACTION W/PHACO  Left 05/24/2018   Procedure: CATARACT EXTRACTION PHACO AND INTRAOCULAR LENS PLACEMENT ([place]);  Surgeon: Tonny Branch, MD;  Location: [facility];  Service: Ophthalmology;  Laterality: Left;  CDE: 7.67   CATARACT EXTRACTION W/PHACO Right 11/21/2019   Procedure: CATARACT EXTRACTION PHACO AND INTRAOCULAR LENS PLACEMENT RIGHT EYE;  Surgeon: Baruch Goldmann, MD;  Location: [facility];  Service: Ophthalmology;  Laterality: Right;  CDE: 6.26   FRACTURE SURGERY     Hand laceration repair Left    Left carotid endarterectomy     6/06 - Dr. Scot Dock   LOOP RECORDER INSERTION N/A 03/08/2020   Procedure: LOOP RECORDER INSERTION;  Surgeon: Thompson Grayer, MD;  Location: [place] [facility];  Service: Cardiovascular;  Laterality: N/A;   skin graft to leg Left    TEE WITHOUT CARDIOVERSION N/A 03/08/2020   Procedure: TRANSESOPHAGEAL ECHOCARDIOGRAM (TEE);  Surgeon: Acie Fredrickson, Wonda Cheng, MD;  Location: [facility]OS[facility];  Service: Cardiovascular;  Laterality: N/A;  loop after   Tibia/fibula fracture repair Left    VASCULAR SURGERY      Past Family History   Family History  Problem Relation Age of Onset   Coronary artery disease Mother    Diabetes Mother    Cancer Father        throat   Fibromyalgia Sister    Cancer Brother    Cancer Brother    Colon cancer Neg Hx     Past Social History   Social History   Socioeconomic History   Marital status: Divorced    Spouse name: Not on file   Number of children: 2   Years of education: 9   Highest education level: 9th grade  Occupational History   Not on file  Tobacco Use   Smoking status: Every Day    Packs/day: 0.50    Years: 60.00    Pack years: 30.00    Types: Cigarettes    Last attempt to quit: 12/17/2019    Years since quitting: 1.8   Smokeless tobacco: Never   Tobacco comments:    smokes 3/4 pack per day now 09/19/21  Vaping Use   Vaping Use: Never used  Substance and Sexual Activity   Alcohol use: Yes    Comment: seldom, 09/19/21 none, patient states last  etoh a "right good while"   Drug use: Yes    Types: Marijuana, Oxycodone    Comment: 01/17/20 uses daily; 09/23/21 once a day   Sexual activity: Not Currently    Birth control/protection: None  Other Topics Concern   Not on file  Social History Narrative   09/19/21 lives alone   Caffeine- heavy use   Social Determinants of Health   Financial Resource Strain: Not on file  Food Insecurity: Not on file  Transportation Needs: Not on file  Physical Activity: Not on file  Stress: Not on file  Social Connections: Not on file  Intimate Partner Violence: Not on file    Review of Systems   General: Negative for anorexia, +weight loss, fever, chills, fatigue, weakness. Eyes: Negative for vision changes.  ENT: Negative for hoarseness, difficulty swallowing , nasal congestion. CV: Negative for chest pain, angina, palpitations, dyspnea on exertion, peripheral edema.  Respiratory: Negative for dyspnea at rest, dyspnea on exertion, cough, sputum, wheezing.  GI: See history of present illness. GU:  Negative for dysuria, hematuria, urinary incontinence, urinary frequency, nocturnal urination.  MS: Negative for joint pain, ++low back pain.  Derm: Negative for rash or itching.  Neuro: Negative for  seizure, frequent headaches. See hpi  Psych: Negative for anxiety, depression, suicidal ideation, hallucinations.  Endo: Negative for unusual weight change. See hpi Heme: Negative for bruising or bleeding. Allergy: Negative for rash or hives.  Physical Exam   BP 122/70 (BP Location: Right Arm, Patient Position: Sitting, Cuff Size: Normal)   Pulse 98   Temp (!) 97.5 F (36.4 C) (Temporal)   Ht 5' 10"  (1.778 m)   Wt 136 lb 6.4 oz (61.9 kg)   SpO2 99%   BMI 19.57 kg/m    General: thin, well-developed in no acute distress. Accompanied by daughter.  Head: Normocephalic, atraumatic.   Eyes: Conjunctiva pink, no icterus. Mouth: Oropharyngeal mucosa moist and pink , no lesions erythema or  exudate. Neck: Supple without thyromegaly, masses, or lymphadenopathy.  Lungs: Clear to auscultation bilaterally.  Heart: Regular rate and rhythm, no murmurs rubs or gallops.  Abdomen: Bowel sounds are normal, nontender, nondistended, no hepatosplenomegaly or masses,  no abdominal bruits or hernia, no rebound or guarding.   Rectal: deferred Extremities: No lower extremity edema. No clubbing or deformities.  Neuro: Alert and oriented x 4 ,   Skin: Warm and dry, no rash or jaundice.   Psych: Alert and cooperative, normal mood and affect.  Labs   08/23/2021 CBC W/ Auto Diff       Wbc 5.9 x10e3/uL 3.4-10.8 x10e3/uL Final [facility] ([place]): [address], [place]            Rbc 4.40 x10e6/uL 4.14-5.80 x10e6/uL Final [facility] ([place]): [address], [place]            Hemoglobin 13.9 g/dL 13.0-17.7 g/dL Final [facility] ([place]): [address], [place]            Hematocrit 39.5 % 37.5-51.0 % Final [facility] ([place]): [address], [place]            Mcv 90 fL 79-97 fL Final [facility] ([place]): [address], [place]            Mch 31.6 pg 26.6-33.0 pg Final [facility] ([place]): [address], [place]            Mchc 35.2 g/dL 31.5-35.7 g/dL Final [facility] ([place]): [place]            Rdw 13.1 % 11.6-15.4 % Final [facility] ([place]): [place]            Platelets 167 x10e3/uL 150-450 x10e3/uL Final [facility] ([place]): [address], [place]            Neutrophils 62 % not estab. % Final [facility] (): [address],             Lymphs 26 % not estab. % Final  [facility] ([place]): [place], [place]            Monocytes 9 % not estab. % Final [facility] ([place]): [place], [place]            Eos 2 % not estab. % Final [facility] ([place]): [place], [place]            Basos 1 % not estab. % Final [facility] ([place]): [place], [place]            Immature Cells np   Cancelled [facility]  ([place]): [place]            Neutrophils (Absolute) 3.7 x10e3/uL 1.4-7.0 x10e3/uL Final [facility] ([place]): [place], [place]            Lymphs (Absolute) 1.6 x10e3/uL 0.7-3.1 x10e3/uL Final [facility] ([place]): [place]            Monocytes(absolute) 0.5 x10e3/uL 0.1-0.9 x10e3/uL Final [facility] ([place]): [place], [place]            Eos (Absolute) 0.1 x10e3/uL 0.0-0.4 x10e3/uL Final [facility] ([place]): [place], [place]            Baso (Absolute) 0.0 x10e3/uL 0.0-0.2 x10e3/uL Final [facility] ([place]): [place]            Immature Granulocytes 0 % not estab. % Final [facility] ([place]): [address], [place]            Immature Grans (Abs) 0.0 x10e3/uL 0.0-0.1 x10e3/uL Final [facility] [employer]): [place]            Nrbc np   Cancelled [place] ([place]): [place], [place]            Hematology Comments: np   Cancelled [place] ([place]): [place]  08/23/2021 CMP, Serum or Plasma   Above High Normal   Glucose 146 mg/dL 70-99 mg/dL Final [facility] ([place]): [address], [place]            Bun 13 mg/dL 8-27 mg/dL Final [facility] ([place]): [place]            Creatinine 1.12 mg/dL 0.76-1.27 mg/dL Final [facility] ([place]): [place], [place]            Egfr 69 mL/min/1.73 >59 mL/min/1.73 Final [facility] ([place]): [place]            BUN/creatinine Ratio 12 10-24 Final [facility] ([place]): [address], [place]            Sodium 141 mmol/L 134-144 mmol/L Final [facility] ([place]): [address], [place]            Potassium 4.1 mmol/L 3.5-5.2 mmol/L Final [facility] ([place]): [address], [place]            Chloride 104 mmol/L 96-106 mmol/L Final [facility] ([place]): [address], [place]            Carbon Dioxide, Total 25 mmol/L 20-29 mmol/L Final [facility] ([place]): [address], [place]             Calcium 9.9 mg/dL 8.6-10.2 mg/dL Final [facility] ([place]): [place], [place]            Protein, Total 6.9 g/dL 6.0-8.5 g/dL Final [facility] (): [address],             Albumin 4.6 g/dL 3.7-4.7 g/dL Final [facility] [employer]): [place]  Globulin, Total 2.3 g/dL 1.5-4.5 g/dL Final [facility] ([place]): [place], [place]            A/g Ratio 2.0 1.2-2.2 Final [facility] ([place]): [place]            Bilirubin, Total 0.5 mg/dL 0.0-1.2 mg/dL Final [facility] ([place]): [address], [place]        Above High Normal   Alkaline Phosphatase 126 IU/L 44-121 IU/L Final [facility] ([place]): [address], [place]            Ast (Sgot) 23 IU/L 0-40 IU/L Final [facility] ([place]): [address], [place]            Alt (Sgpt) 32 IU/L 0-44 IU/L Final [facility] ([place]): [place]  08/23/2021 Lipid Panel, Serum       Cholesterol, Total 115 mg/dL 100-199 mg/dL Final [facility] ([place]): [address], [place]            Triglycerides 42 mg/dL 0-149 mg/dL Final [facility] ([place]): [place]            HDL Cholesterol 58 mg/dL >39 mg/dL Final [facility] ([place]): [address], [place]            VLDL Cholesterol Cal 11 mg/dL 5-40 mg/dL Final [facility] ([place]): [address], [place]            LDL Chol Calc ([place]) 46 mg/dL 0-99 mg/dL Final [facility] ([place]): [place]            Comment: np   Cancelled [facility] [employer]): [place] chol/HDL Ratio 2.0 ratio 0.0-5.0 ratio Final [facility] ([place]): [place]  08/23/2021 HbA1C (Hemoglobin a1C), Blood   Above High Normal   Hemoglobin a1C 6.0 % 4.8-5.6 % Final [facility] ([place]): [address], [place]  08/23/2021 PSA, Total, Serum or Plasma       Prostate Specific Ag 2.3 NG/mL 0.0-4.0 NG/mL Final    Imaging Studies   CUP PACEART REMOTE DEVICE CHECK  Result Date:  10/09/2021 ILR summary report received. Battery status OK. Normal device function. No new symptom, tachy, brady, or pause episodes. No new AF episodes. Monthly summary reports and ROV/PRN LA   Assessment   Positive Cologuard: patient without overt bleeding or GI complaints. Unclear when last colonoscopy was, but I can see path from 2010. He has had adenomatous colon polyps numerous times as well as tubulovillous adenomas.    PLAN   Colonoscopy with Dr. Gala Romney. ASA 3.  I have discussed the risks, alternatives, benefits with regards to but not limited to the risk of reaction to medication, bleeding, infection, perforation and the patient is agreeable to proceed. Written consent to be obtained.    Laureen Ochs. Bobby Rumpf, [place], [facility]

## 2021-10-15 NOTE — Patient Instructions (Signed)
Colonoscopy to be scheduled. Separate instructions to be provided.

## 2021-10-17 ENCOUNTER — Telehealth: Payer: Self-pay | Admitting: *Deleted

## 2021-10-17 NOTE — Telephone Encounter (Signed)
LMOVM to call back to schedule TCS with Dr. Gala Romney, ASA 3

## 2021-10-18 ENCOUNTER — Encounter: Payer: Self-pay | Admitting: *Deleted

## 2021-10-18 MED ORDER — PEG 3350-KCL-NA BICARB-NACL 420 G PO SOLR: ORAL | 0 refills | Status: DC

## 2021-10-18 NOTE — Telephone Encounter (Signed)
Spoke with pt daughter. He has been scheduled for 7/12 at 1:30pm. Aware will mail instructions with pre-op and will send prep to pharmacy. Daughter asked if insturcitons can be mailed to her at [facility] in [place].    PA approved via cohere. Auth# [id number], DOS: 12/04/2021 - 03/04/2022

## 2021-10-23 ENCOUNTER — Encounter: Payer: Self-pay | Admitting: Gastroenterology

## 2021-10-28 NOTE — Progress Notes (Signed)
[facility] Summary Report / Loop Recorder 

## 2021-11-08 DIAGNOSIS — R6889 Other general symptoms and signs: Secondary | ICD-10-CM | POA: Diagnosis not present

## 2021-11-08 DIAGNOSIS — R030 Elevated blood-pressure reading, without diagnosis of hypertension: Secondary | ICD-10-CM | POA: Diagnosis not present

## 2021-11-08 DIAGNOSIS — M5136 Other intervertebral disc degeneration, lumbar region: Secondary | ICD-10-CM | POA: Diagnosis not present

## 2021-11-08 DIAGNOSIS — Z79899 Other long term (current) drug therapy: Secondary | ICD-10-CM | POA: Diagnosis not present

## 2021-11-08 DIAGNOSIS — G894 Chronic pain syndrome: Secondary | ICD-10-CM | POA: Diagnosis not present

## 2021-11-08 DIAGNOSIS — Z6821 Body mass index (BMI) 21.0-21.9, adult: Secondary | ICD-10-CM | POA: Diagnosis not present

## 2021-11-11 ENCOUNTER — Ambulatory Visit (INDEPENDENT_AMBULATORY_CARE_PROVIDER_SITE_OTHER): Payer: Medicare HMO

## 2021-11-11 DIAGNOSIS — I63011 Cerebral infarction due to thrombosis of right vertebral artery: Secondary | ICD-10-CM

## 2021-11-13 LAB — CUP PACEART REMOTE DEVICE CHECK
Date Time Interrogation Session: 20230610230614
Implantable Pulse Generator Implant Date: 20211014

## 2021-11-13 NOTE — Telephone Encounter (Signed)
Received call from daughter needing to cancel procedure. Wants to wait until Sept as he will be doing a lot of traveling this summer. Aware will call once we have this schedule.

## 2021-11-22 NOTE — Telephone Encounter (Signed)
noted 

## 2021-11-29 ENCOUNTER — Other Ambulatory Visit (HOSPITAL_COMMUNITY): Payer: Medicare HMO

## 2021-12-03 NOTE — Progress Notes (Signed)
[facility] Summary Report / Loop Recorder 

## 2021-12-04 ENCOUNTER — Ambulatory Visit (HOSPITAL_COMMUNITY): Admit: 2021-12-04 | Payer: Medicare HMO | Admitting: Internal Medicine

## 2021-12-04 ENCOUNTER — Encounter (HOSPITAL_COMMUNITY): Payer: Self-pay

## 2021-12-04 SURGERY — COLONOSCOPY WITH PROPOFOL
Anesthesia: Monitor Anesthesia Care

## 2021-12-09 ENCOUNTER — Ambulatory Visit (INDEPENDENT_AMBULATORY_CARE_PROVIDER_SITE_OTHER): Payer: Medicare HMO

## 2021-12-09 DIAGNOSIS — I63011 Cerebral infarction due to thrombosis of right vertebral artery: Secondary | ICD-10-CM | POA: Diagnosis not present

## 2021-12-10 DIAGNOSIS — G894 Chronic pain syndrome: Secondary | ICD-10-CM | POA: Diagnosis not present

## 2021-12-10 DIAGNOSIS — M5136 Other intervertebral disc degeneration, lumbar region: Secondary | ICD-10-CM | POA: Diagnosis not present

## 2021-12-10 DIAGNOSIS — Z79899 Other long term (current) drug therapy: Secondary | ICD-10-CM | POA: Diagnosis not present

## 2021-12-10 DIAGNOSIS — Z9181 History of falling: Secondary | ICD-10-CM | POA: Diagnosis not present

## 2021-12-10 DIAGNOSIS — Z6821 Body mass index (BMI) 21.0-21.9, adult: Secondary | ICD-10-CM | POA: Diagnosis not present

## 2021-12-10 DIAGNOSIS — R030 Elevated blood-pressure reading, without diagnosis of hypertension: Secondary | ICD-10-CM | POA: Diagnosis not present

## 2021-12-10 DIAGNOSIS — F1120 Opioid dependence, uncomplicated: Secondary | ICD-10-CM | POA: Diagnosis not present

## 2021-12-10 DIAGNOSIS — E119 Type 2 diabetes mellitus without complications: Secondary | ICD-10-CM | POA: Diagnosis not present

## 2021-12-10 DIAGNOSIS — R6889 Other general symptoms and signs: Secondary | ICD-10-CM | POA: Diagnosis not present

## 2021-12-11 LAB — CUP PACEART REMOTE DEVICE CHECK
Date Time Interrogation Session: 20230713230508
Implantable Pulse Generator Implant Date: 20211014

## 2021-12-31 ENCOUNTER — Encounter: Payer: Self-pay | Admitting: *Deleted

## 2021-12-31 MED ORDER — PEG 3350-KCL-NA BICARB-NACL 420 G PO SOLR: 4000.0000 mL | Freq: Once | ORAL | 0 refills | Status: AC

## 2021-12-31 NOTE — Addendum Note (Signed)
Addended by: Cheron Every on: 12/31/2021 09:18 AM   Modules accepted: Orders

## 2021-12-31 NOTE — Telephone Encounter (Signed)
Spoke with daughter. Scheduled for 9/6 at 8:15am. Aware will mail instructions/pre-op appt. New rx sent to pharmacy.

## 2022-01-07 ENCOUNTER — Telehealth: Payer: Self-pay | Admitting: *Deleted

## 2022-01-07 NOTE — Telephone Encounter (Signed)
Pt daughter Lenna Sciara called in. Wants to cancel procedure for now. Will call back to reschedule

## 2022-01-08 NOTE — Progress Notes (Signed)
[facility] Summary Report / Loop Recorder 

## 2022-01-09 DIAGNOSIS — Z79899 Other long term (current) drug therapy: Secondary | ICD-10-CM | POA: Diagnosis not present

## 2022-01-09 DIAGNOSIS — G894 Chronic pain syndrome: Secondary | ICD-10-CM | POA: Diagnosis not present

## 2022-01-09 DIAGNOSIS — Z9181 History of falling: Secondary | ICD-10-CM | POA: Diagnosis not present

## 2022-01-09 DIAGNOSIS — F1120 Opioid dependence, uncomplicated: Secondary | ICD-10-CM | POA: Diagnosis not present

## 2022-01-09 DIAGNOSIS — R030 Elevated blood-pressure reading, without diagnosis of hypertension: Secondary | ICD-10-CM | POA: Diagnosis not present

## 2022-01-09 DIAGNOSIS — E119 Type 2 diabetes mellitus without complications: Secondary | ICD-10-CM | POA: Diagnosis not present

## 2022-01-09 DIAGNOSIS — Z681 Body mass index (BMI) 19 or less, adult: Secondary | ICD-10-CM | POA: Diagnosis not present

## 2022-01-09 DIAGNOSIS — R6889 Other general symptoms and signs: Secondary | ICD-10-CM | POA: Diagnosis not present

## 2022-01-09 LAB — CUP PACEART REMOTE DEVICE CHECK
Date Time Interrogation Session: 20230815231540
Implantable Pulse Generator Implant Date: 20211014

## 2022-01-13 ENCOUNTER — Ambulatory Visit (INDEPENDENT_AMBULATORY_CARE_PROVIDER_SITE_OTHER): Payer: Medicare HMO

## 2022-01-13 DIAGNOSIS — I63011 Cerebral infarction due to thrombosis of right vertebral artery: Secondary | ICD-10-CM

## 2022-01-13 DIAGNOSIS — Z79899 Other long term (current) drug therapy: Secondary | ICD-10-CM | POA: Diagnosis not present

## 2022-01-24 ENCOUNTER — Other Ambulatory Visit (HOSPITAL_COMMUNITY): Payer: Medicare HMO

## 2022-01-27 DIAGNOSIS — Z743 Need for continuous supervision: Secondary | ICD-10-CM | POA: Diagnosis not present

## 2022-01-27 DIAGNOSIS — Z4682 Encounter for fitting and adjustment of non-vascular catheter: Secondary | ICD-10-CM | POA: Diagnosis not present

## 2022-01-27 DIAGNOSIS — I639 Cerebral infarction, unspecified: Secondary | ICD-10-CM | POA: Diagnosis not present

## 2022-01-27 DIAGNOSIS — R109 Unspecified abdominal pain: Secondary | ICD-10-CM | POA: Diagnosis not present

## 2022-01-27 DIAGNOSIS — G9389 Other specified disorders of brain: Secondary | ICD-10-CM | POA: Diagnosis not present

## 2022-01-27 DIAGNOSIS — J9600 Acute respiratory failure, unspecified whether with hypoxia or hypercapnia: Secondary | ICD-10-CM | POA: Diagnosis not present

## 2022-01-27 DIAGNOSIS — D7389 Other diseases of spleen: Secondary | ICD-10-CM | POA: Diagnosis not present

## 2022-01-27 DIAGNOSIS — R55 Syncope and collapse: Secondary | ICD-10-CM | POA: Diagnosis not present

## 2022-01-27 DIAGNOSIS — T50902A Poisoning by unspecified drugs, medicaments and biological substances, intentional self-harm, initial encounter: Secondary | ICD-10-CM | POA: Diagnosis not present

## 2022-01-27 DIAGNOSIS — F0392 Unspecified dementia, unspecified severity, with psychotic disturbance: Secondary | ICD-10-CM | POA: Diagnosis not present

## 2022-01-27 DIAGNOSIS — R440 Auditory hallucinations: Secondary | ICD-10-CM | POA: Diagnosis not present

## 2022-01-27 DIAGNOSIS — T50901A Poisoning by unspecified drugs, medicaments and biological substances, accidental (unintentional), initial encounter: Secondary | ICD-10-CM | POA: Diagnosis not present

## 2022-01-27 DIAGNOSIS — I6523 Occlusion and stenosis of bilateral carotid arteries: Secondary | ICD-10-CM | POA: Diagnosis not present

## 2022-01-27 DIAGNOSIS — T6591XA Toxic effect of unspecified substance, accidental (unintentional), initial encounter: Secondary | ICD-10-CM | POA: Diagnosis not present

## 2022-01-27 DIAGNOSIS — E119 Type 2 diabetes mellitus without complications: Secondary | ICD-10-CM | POA: Diagnosis not present

## 2022-01-27 DIAGNOSIS — K5900 Constipation, unspecified: Secondary | ICD-10-CM | POA: Diagnosis not present

## 2022-01-27 DIAGNOSIS — K6389 Other specified diseases of intestine: Secondary | ICD-10-CM | POA: Diagnosis not present

## 2022-01-27 DIAGNOSIS — R918 Other nonspecific abnormal finding of lung field: Secondary | ICD-10-CM | POA: Diagnosis not present

## 2022-01-27 DIAGNOSIS — G3184 Mild cognitive impairment, so stated: Secondary | ICD-10-CM | POA: Diagnosis not present

## 2022-01-27 DIAGNOSIS — F0390 Unspecified dementia without behavioral disturbance: Secondary | ICD-10-CM | POA: Diagnosis not present

## 2022-01-27 DIAGNOSIS — T551X2A Toxic effect of detergents, intentional self-harm, initial encounter: Secondary | ICD-10-CM | POA: Diagnosis not present

## 2022-01-27 DIAGNOSIS — S41111A Laceration without foreign body of right upper arm, initial encounter: Secondary | ICD-10-CM | POA: Diagnosis not present

## 2022-01-27 DIAGNOSIS — Z7984 Long term (current) use of oral hypoglycemic drugs: Secondary | ICD-10-CM | POA: Diagnosis not present

## 2022-01-27 DIAGNOSIS — F29 Unspecified psychosis not due to a substance or known physiological condition: Secondary | ICD-10-CM | POA: Diagnosis not present

## 2022-01-27 DIAGNOSIS — R5383 Other fatigue: Secondary | ICD-10-CM | POA: Diagnosis not present

## 2022-01-27 DIAGNOSIS — I6503 Occlusion and stenosis of bilateral vertebral arteries: Secondary | ICD-10-CM | POA: Diagnosis not present

## 2022-01-27 DIAGNOSIS — T1491XA Suicide attempt, initial encounter: Secondary | ICD-10-CM | POA: Diagnosis not present

## 2022-01-27 DIAGNOSIS — F22 Delusional disorders: Secondary | ICD-10-CM | POA: Diagnosis not present

## 2022-01-27 DIAGNOSIS — J9690 Respiratory failure, unspecified, unspecified whether with hypoxia or hypercapnia: Secondary | ICD-10-CM | POA: Diagnosis not present

## 2022-01-27 DIAGNOSIS — S41112A Laceration without foreign body of left upper arm, initial encounter: Secondary | ICD-10-CM | POA: Diagnosis not present

## 2022-01-27 DIAGNOSIS — R1310 Dysphagia, unspecified: Secondary | ICD-10-CM | POA: Diagnosis not present

## 2022-01-27 DIAGNOSIS — R4182 Altered mental status, unspecified: Secondary | ICD-10-CM | POA: Diagnosis not present

## 2022-01-27 DIAGNOSIS — T1491XD Suicide attempt, subsequent encounter: Secondary | ICD-10-CM | POA: Diagnosis not present

## 2022-01-27 DIAGNOSIS — S41119A Laceration without foreign body of unspecified upper arm, initial encounter: Secondary | ICD-10-CM | POA: Diagnosis not present

## 2022-01-27 DIAGNOSIS — G928 Other toxic encephalopathy: Secondary | ICD-10-CM | POA: Diagnosis not present

## 2022-01-28 DIAGNOSIS — R55 Syncope and collapse: Secondary | ICD-10-CM | POA: Diagnosis not present

## 2022-01-28 DIAGNOSIS — J9690 Respiratory failure, unspecified, unspecified whether with hypoxia or hypercapnia: Secondary | ICD-10-CM | POA: Diagnosis not present

## 2022-01-28 DIAGNOSIS — G928 Other toxic encephalopathy: Secondary | ICD-10-CM | POA: Diagnosis not present

## 2022-01-28 DIAGNOSIS — T6591XA Toxic effect of unspecified substance, accidental (unintentional), initial encounter: Secondary | ICD-10-CM | POA: Diagnosis not present

## 2022-01-28 DIAGNOSIS — Z4682 Encounter for fitting and adjustment of non-vascular catheter: Secondary | ICD-10-CM | POA: Diagnosis not present

## 2022-01-28 DIAGNOSIS — F0390 Unspecified dementia without behavioral disturbance: Secondary | ICD-10-CM | POA: Diagnosis not present

## 2022-01-28 DIAGNOSIS — F22 Delusional disorders: Secondary | ICD-10-CM | POA: Diagnosis not present

## 2022-01-28 DIAGNOSIS — T50901A Poisoning by unspecified drugs, medicaments and biological substances, accidental (unintentional), initial encounter: Secondary | ICD-10-CM | POA: Diagnosis not present

## 2022-01-28 DIAGNOSIS — T1491XA Suicide attempt, initial encounter: Secondary | ICD-10-CM | POA: Diagnosis not present

## 2022-01-28 DIAGNOSIS — S41119A Laceration without foreign body of unspecified upper arm, initial encounter: Secondary | ICD-10-CM | POA: Diagnosis not present

## 2022-01-28 DIAGNOSIS — E119 Type 2 diabetes mellitus without complications: Secondary | ICD-10-CM | POA: Diagnosis not present

## 2022-01-29 ENCOUNTER — Ambulatory Visit (HOSPITAL_COMMUNITY): Admit: 2022-01-29 | Payer: Medicare HMO | Admitting: Internal Medicine

## 2022-01-29 ENCOUNTER — Encounter (HOSPITAL_COMMUNITY): Payer: Self-pay

## 2022-01-29 DIAGNOSIS — R55 Syncope and collapse: Secondary | ICD-10-CM | POA: Diagnosis not present

## 2022-01-29 DIAGNOSIS — R5383 Other fatigue: Secondary | ICD-10-CM | POA: Diagnosis not present

## 2022-01-29 DIAGNOSIS — T50901A Poisoning by unspecified drugs, medicaments and biological substances, accidental (unintentional), initial encounter: Secondary | ICD-10-CM | POA: Diagnosis not present

## 2022-01-29 SURGERY — COLONOSCOPY WITH PROPOFOL
Anesthesia: Monitor Anesthesia Care

## 2022-01-30 DIAGNOSIS — R918 Other nonspecific abnormal finding of lung field: Secondary | ICD-10-CM | POA: Diagnosis not present

## 2022-01-30 DIAGNOSIS — T50901A Poisoning by unspecified drugs, medicaments and biological substances, accidental (unintentional), initial encounter: Secondary | ICD-10-CM | POA: Diagnosis not present

## 2022-02-06 DIAGNOSIS — J9690 Respiratory failure, unspecified, unspecified whether with hypoxia or hypercapnia: Secondary | ICD-10-CM | POA: Diagnosis not present

## 2022-02-06 DIAGNOSIS — T50901A Poisoning by unspecified drugs, medicaments and biological substances, accidental (unintentional), initial encounter: Secondary | ICD-10-CM | POA: Diagnosis not present

## 2022-02-06 DIAGNOSIS — E119 Type 2 diabetes mellitus without complications: Secondary | ICD-10-CM | POA: Diagnosis not present

## 2022-02-06 DIAGNOSIS — F22 Delusional disorders: Secondary | ICD-10-CM | POA: Diagnosis not present

## 2022-02-06 DIAGNOSIS — G928 Other toxic encephalopathy: Secondary | ICD-10-CM | POA: Diagnosis not present

## 2022-02-06 DIAGNOSIS — T6591XA Toxic effect of unspecified substance, accidental (unintentional), initial encounter: Secondary | ICD-10-CM | POA: Diagnosis not present

## 2022-02-06 DIAGNOSIS — S41119A Laceration without foreign body of unspecified upper arm, initial encounter: Secondary | ICD-10-CM | POA: Diagnosis not present

## 2022-02-06 DIAGNOSIS — F0390 Unspecified dementia without behavioral disturbance: Secondary | ICD-10-CM | POA: Diagnosis not present

## 2022-02-06 DIAGNOSIS — T1491XA Suicide attempt, initial encounter: Secondary | ICD-10-CM | POA: Diagnosis not present

## 2022-02-10 NOTE — Progress Notes (Signed)
[facility] Summary Report / Loop Recorder 

## 2022-02-17 ENCOUNTER — Ambulatory Visit (INDEPENDENT_AMBULATORY_CARE_PROVIDER_SITE_OTHER): Payer: Medicare HMO

## 2022-02-17 DIAGNOSIS — I639 Cerebral infarction, unspecified: Secondary | ICD-10-CM

## 2022-02-18 LAB — CUP PACEART REMOTE DEVICE CHECK
Date Time Interrogation Session: 20230917230603
Implantable Pulse Generator Implant Date: 20211014

## 2022-02-28 DIAGNOSIS — Z7984 Long term (current) use of oral hypoglycemic drugs: Secondary | ICD-10-CM | POA: Diagnosis not present

## 2022-02-28 DIAGNOSIS — S41119A Laceration without foreign body of unspecified upper arm, initial encounter: Secondary | ICD-10-CM | POA: Diagnosis not present

## 2022-02-28 DIAGNOSIS — F0390 Unspecified dementia without behavioral disturbance: Secondary | ICD-10-CM | POA: Diagnosis not present

## 2022-02-28 DIAGNOSIS — S41112A Laceration without foreign body of left upper arm, initial encounter: Secondary | ICD-10-CM | POA: Diagnosis not present

## 2022-02-28 DIAGNOSIS — T6591XA Toxic effect of unspecified substance, accidental (unintentional), initial encounter: Secondary | ICD-10-CM | POA: Diagnosis not present

## 2022-02-28 DIAGNOSIS — E119 Type 2 diabetes mellitus without complications: Secondary | ICD-10-CM | POA: Diagnosis not present

## 2022-02-28 DIAGNOSIS — J9690 Respiratory failure, unspecified, unspecified whether with hypoxia or hypercapnia: Secondary | ICD-10-CM | POA: Diagnosis not present

## 2022-02-28 DIAGNOSIS — T1491XA Suicide attempt, initial encounter: Secondary | ICD-10-CM | POA: Diagnosis not present

## 2022-02-28 DIAGNOSIS — G928 Other toxic encephalopathy: Secondary | ICD-10-CM | POA: Diagnosis not present

## 2022-02-28 DIAGNOSIS — R440 Auditory hallucinations: Secondary | ICD-10-CM | POA: Diagnosis not present

## 2022-02-28 DIAGNOSIS — T50901A Poisoning by unspecified drugs, medicaments and biological substances, accidental (unintentional), initial encounter: Secondary | ICD-10-CM | POA: Diagnosis not present

## 2022-02-28 DIAGNOSIS — S41111A Laceration without foreign body of right upper arm, initial encounter: Secondary | ICD-10-CM | POA: Diagnosis not present

## 2022-03-05 DIAGNOSIS — T50901A Poisoning by unspecified drugs, medicaments and biological substances, accidental (unintentional), initial encounter: Secondary | ICD-10-CM | POA: Diagnosis not present

## 2022-03-05 DIAGNOSIS — T6591XA Toxic effect of unspecified substance, accidental (unintentional), initial encounter: Secondary | ICD-10-CM | POA: Diagnosis not present

## 2022-03-05 DIAGNOSIS — R440 Auditory hallucinations: Secondary | ICD-10-CM | POA: Diagnosis not present

## 2022-03-05 DIAGNOSIS — T1491XA Suicide attempt, initial encounter: Secondary | ICD-10-CM | POA: Diagnosis not present

## 2022-03-05 DIAGNOSIS — F0390 Unspecified dementia without behavioral disturbance: Secondary | ICD-10-CM | POA: Diagnosis not present

## 2022-03-05 DIAGNOSIS — J9690 Respiratory failure, unspecified, unspecified whether with hypoxia or hypercapnia: Secondary | ICD-10-CM | POA: Diagnosis not present

## 2022-03-05 DIAGNOSIS — S41119A Laceration without foreign body of unspecified upper arm, initial encounter: Secondary | ICD-10-CM | POA: Diagnosis not present

## 2022-03-05 DIAGNOSIS — Z7984 Long term (current) use of oral hypoglycemic drugs: Secondary | ICD-10-CM | POA: Diagnosis not present

## 2022-03-05 DIAGNOSIS — E119 Type 2 diabetes mellitus without complications: Secondary | ICD-10-CM | POA: Diagnosis not present

## 2022-03-05 NOTE — Progress Notes (Signed)
[facility] Summary Report / Loop Recorder 

## 2022-03-11 DIAGNOSIS — J9690 Respiratory failure, unspecified, unspecified whether with hypoxia or hypercapnia: Secondary | ICD-10-CM | POA: Diagnosis not present

## 2022-03-11 DIAGNOSIS — E119 Type 2 diabetes mellitus without complications: Secondary | ICD-10-CM | POA: Diagnosis not present

## 2022-03-11 DIAGNOSIS — R440 Auditory hallucinations: Secondary | ICD-10-CM | POA: Diagnosis not present

## 2022-03-11 DIAGNOSIS — F0390 Unspecified dementia without behavioral disturbance: Secondary | ICD-10-CM | POA: Diagnosis not present

## 2022-03-11 DIAGNOSIS — T1491XA Suicide attempt, initial encounter: Secondary | ICD-10-CM | POA: Diagnosis not present

## 2022-03-11 DIAGNOSIS — S41119A Laceration without foreign body of unspecified upper arm, initial encounter: Secondary | ICD-10-CM | POA: Diagnosis not present

## 2022-03-11 DIAGNOSIS — T50901A Poisoning by unspecified drugs, medicaments and biological substances, accidental (unintentional), initial encounter: Secondary | ICD-10-CM | POA: Diagnosis not present

## 2022-03-11 DIAGNOSIS — Z7984 Long term (current) use of oral hypoglycemic drugs: Secondary | ICD-10-CM | POA: Diagnosis not present

## 2022-03-11 DIAGNOSIS — T6591XA Toxic effect of unspecified substance, accidental (unintentional), initial encounter: Secondary | ICD-10-CM | POA: Diagnosis not present

## 2022-03-20 DIAGNOSIS — F0670 Mild neurocognitive disorder due to known physiological condition without behavioral disturbance: Secondary | ICD-10-CM | POA: Diagnosis not present

## 2022-03-24 ENCOUNTER — Ambulatory Visit: Payer: Medicare HMO

## 2022-03-31 DIAGNOSIS — E119 Type 2 diabetes mellitus without complications: Secondary | ICD-10-CM | POA: Diagnosis not present

## 2022-03-31 DIAGNOSIS — F0390 Unspecified dementia without behavioral disturbance: Secondary | ICD-10-CM | POA: Diagnosis not present

## 2022-03-31 DIAGNOSIS — Z7984 Long term (current) use of oral hypoglycemic drugs: Secondary | ICD-10-CM | POA: Diagnosis not present

## 2022-03-31 DIAGNOSIS — S41119A Laceration without foreign body of unspecified upper arm, initial encounter: Secondary | ICD-10-CM | POA: Diagnosis not present

## 2022-03-31 DIAGNOSIS — T50901A Poisoning by unspecified drugs, medicaments and biological substances, accidental (unintentional), initial encounter: Secondary | ICD-10-CM | POA: Diagnosis not present

## 2022-03-31 DIAGNOSIS — R440 Auditory hallucinations: Secondary | ICD-10-CM | POA: Diagnosis not present

## 2022-03-31 DIAGNOSIS — T6591XA Toxic effect of unspecified substance, accidental (unintentional), initial encounter: Secondary | ICD-10-CM | POA: Diagnosis not present

## 2022-03-31 DIAGNOSIS — J9690 Respiratory failure, unspecified, unspecified whether with hypoxia or hypercapnia: Secondary | ICD-10-CM | POA: Diagnosis not present

## 2022-03-31 DIAGNOSIS — T1491XA Suicide attempt, initial encounter: Secondary | ICD-10-CM | POA: Diagnosis not present

## 2022-04-07 DIAGNOSIS — E119 Type 2 diabetes mellitus without complications: Secondary | ICD-10-CM | POA: Diagnosis not present

## 2022-04-07 DIAGNOSIS — R440 Auditory hallucinations: Secondary | ICD-10-CM | POA: Diagnosis not present

## 2022-04-07 DIAGNOSIS — S41119A Laceration without foreign body of unspecified upper arm, initial encounter: Secondary | ICD-10-CM | POA: Diagnosis not present

## 2022-04-07 DIAGNOSIS — T6591XA Toxic effect of unspecified substance, accidental (unintentional), initial encounter: Secondary | ICD-10-CM | POA: Diagnosis not present

## 2022-04-07 DIAGNOSIS — J9690 Respiratory failure, unspecified, unspecified whether with hypoxia or hypercapnia: Secondary | ICD-10-CM | POA: Diagnosis not present

## 2022-04-07 DIAGNOSIS — Z7984 Long term (current) use of oral hypoglycemic drugs: Secondary | ICD-10-CM | POA: Diagnosis not present

## 2022-04-07 DIAGNOSIS — T1491XA Suicide attempt, initial encounter: Secondary | ICD-10-CM | POA: Diagnosis not present

## 2022-04-07 DIAGNOSIS — F0390 Unspecified dementia without behavioral disturbance: Secondary | ICD-10-CM | POA: Diagnosis not present

## 2022-04-07 DIAGNOSIS — T50901A Poisoning by unspecified drugs, medicaments and biological substances, accidental (unintentional), initial encounter: Secondary | ICD-10-CM | POA: Diagnosis not present

## 2022-04-14 DIAGNOSIS — S41119A Laceration without foreign body of unspecified upper arm, initial encounter: Secondary | ICD-10-CM | POA: Diagnosis not present

## 2022-04-14 DIAGNOSIS — T6591XA Toxic effect of unspecified substance, accidental (unintentional), initial encounter: Secondary | ICD-10-CM | POA: Diagnosis not present

## 2022-04-14 DIAGNOSIS — F0390 Unspecified dementia without behavioral disturbance: Secondary | ICD-10-CM | POA: Diagnosis not present

## 2022-04-14 DIAGNOSIS — E119 Type 2 diabetes mellitus without complications: Secondary | ICD-10-CM | POA: Diagnosis not present

## 2022-04-14 DIAGNOSIS — Z7984 Long term (current) use of oral hypoglycemic drugs: Secondary | ICD-10-CM | POA: Diagnosis not present

## 2022-04-14 DIAGNOSIS — T50901A Poisoning by unspecified drugs, medicaments and biological substances, accidental (unintentional), initial encounter: Secondary | ICD-10-CM | POA: Diagnosis not present

## 2022-04-14 DIAGNOSIS — R440 Auditory hallucinations: Secondary | ICD-10-CM | POA: Diagnosis not present

## 2022-04-14 DIAGNOSIS — J9690 Respiratory failure, unspecified, unspecified whether with hypoxia or hypercapnia: Secondary | ICD-10-CM | POA: Diagnosis not present

## 2022-04-14 DIAGNOSIS — T1491XA Suicide attempt, initial encounter: Secondary | ICD-10-CM | POA: Diagnosis not present

## 2022-04-15 ENCOUNTER — Telehealth: Payer: Self-pay | Admitting: Physician Assistant

## 2022-04-15 NOTE — Telephone Encounter (Signed)
Patient daughter called in stating that she has a monitor for him and she will go over there after work today to set it up

## 2022-04-15 NOTE — Telephone Encounter (Signed)
Patient's daughter in law, Erline Levine, states the patient has a pacemaker device. She says he has moved to an assisted living and they need to know if it needs to be discontinued or if they need an order for the home. She says he also will have a legal guardian now because he has dementia. She says she will drop off the paperwork at the [facility] office because it is closer for her.

## 2022-04-15 NOTE — Telephone Encounter (Signed)
Spoke to patients family who advised patient requires 24 hour care and facility requiring order for remote monitor to be plugged in. Called and spoke to [employer], advised about ILR and how the remote monitor connects data to our office and stressed importance of having plugged in beside where patient sleeps within 4-6 feet. Reviewed with Dr. Curt Bears and signature obtained and letter faxed to [place]. Fax confirmation received.   Nat Christen  phone #: [phone] Fax #: [phone]

## 2022-04-16 ENCOUNTER — Telehealth: Payer: Self-pay

## 2022-04-16 NOTE — Telephone Encounter (Signed)
Pt's daughter is returning call. Transferred to [employer], Therapist, sports.

## 2022-04-16 NOTE — Telephone Encounter (Signed)
LINQ alert received.  1 tachy event from 9/9 @ 20:14, EGM shows NSVT >20beats, rate 261  Alert received for tachy event February 01, 2022.  Receiving alert now d/t Pt was moved to assisted living facility and did not have monitor with him.  Pt is in assisted living d/t dementia.  He has a legal guardian.  Attempted to call daughter listed on DPR.  Will forward to Dr. Myles Gip for awareness.

## 2022-04-16 NOTE — Telephone Encounter (Signed)
Spoke with daughter.  Daughter states this episode happened around the time the Pt tried to commit suicide.  Advised monitor is working normally and no issues since September.

## 2022-04-21 NOTE — Telephone Encounter (Signed)
Discussed with Dr. Myles Gip.  No action needed at this time.  Will continue to monitor.

## 2022-04-22 DIAGNOSIS — D649 Anemia, unspecified: Secondary | ICD-10-CM | POA: Diagnosis not present

## 2022-04-22 DIAGNOSIS — E1165 Type 2 diabetes mellitus with hyperglycemia: Secondary | ICD-10-CM | POA: Diagnosis not present

## 2022-04-22 DIAGNOSIS — I10 Essential (primary) hypertension: Secondary | ICD-10-CM | POA: Diagnosis not present

## 2022-04-22 DIAGNOSIS — R419 Unspecified symptoms and signs involving cognitive functions and awareness: Secondary | ICD-10-CM | POA: Diagnosis not present

## 2022-04-22 DIAGNOSIS — E782 Mixed hyperlipidemia: Secondary | ICD-10-CM | POA: Diagnosis not present

## 2022-04-22 DIAGNOSIS — G8929 Other chronic pain: Secondary | ICD-10-CM | POA: Diagnosis not present

## 2022-04-22 DIAGNOSIS — I639 Cerebral infarction, unspecified: Secondary | ICD-10-CM | POA: Diagnosis not present

## 2022-04-22 DIAGNOSIS — R443 Hallucinations, unspecified: Secondary | ICD-10-CM | POA: Diagnosis not present

## 2022-04-28 ENCOUNTER — Ambulatory Visit (INDEPENDENT_AMBULATORY_CARE_PROVIDER_SITE_OTHER): Payer: Medicare HMO

## 2022-04-28 DIAGNOSIS — I639 Cerebral infarction, unspecified: Secondary | ICD-10-CM

## 2022-04-29 LAB — CUP PACEART REMOTE DEVICE CHECK
Date Time Interrogation Session: 20231203230748
Implantable Pulse Generator Implant Date: 20211014

## 2022-05-22 DIAGNOSIS — F5103 Paradoxical insomnia: Secondary | ICD-10-CM | POA: Diagnosis not present

## 2022-05-22 DIAGNOSIS — F319 Bipolar disorder, unspecified: Secondary | ICD-10-CM | POA: Diagnosis not present

## 2022-05-22 DIAGNOSIS — F1920 Other psychoactive substance dependence, uncomplicated: Secondary | ICD-10-CM | POA: Diagnosis not present

## 2022-06-02 ENCOUNTER — Ambulatory Visit (INDEPENDENT_AMBULATORY_CARE_PROVIDER_SITE_OTHER): Payer: Medicare HMO

## 2022-06-02 DIAGNOSIS — I63011 Cerebral infarction due to thrombosis of right vertebral artery: Secondary | ICD-10-CM

## 2022-06-03 LAB — CUP PACEART REMOTE DEVICE CHECK
Date Time Interrogation Session: 20240107230820
Implantable Pulse Generator Implant Date: 20211014

## 2022-06-06 NOTE — Progress Notes (Signed)
Carelink Summary Report / Loop Recorder

## 2022-06-20 DIAGNOSIS — Z1331 Encounter for screening for depression: Secondary | ICD-10-CM | POA: Diagnosis not present

## 2022-06-20 DIAGNOSIS — G8929 Other chronic pain: Secondary | ICD-10-CM | POA: Diagnosis not present

## 2022-06-20 DIAGNOSIS — F319 Bipolar disorder, unspecified: Secondary | ICD-10-CM | POA: Diagnosis not present

## 2022-06-20 DIAGNOSIS — Z593 Problems related to living in residential institution: Secondary | ICD-10-CM | POA: Diagnosis not present

## 2022-07-07 ENCOUNTER — Ambulatory Visit: Payer: Medicare HMO

## 2022-07-07 DIAGNOSIS — I63011 Cerebral infarction due to thrombosis of right vertebral artery: Secondary | ICD-10-CM | POA: Diagnosis not present

## 2022-07-08 LAB — CUP PACEART REMOTE DEVICE CHECK
Date Time Interrogation Session: 20240209230846
Implantable Pulse Generator Implant Date: 20211014

## 2022-07-14 NOTE — Progress Notes (Signed)
[facility] Summary Report / Loop Recorder 

## 2022-08-11 ENCOUNTER — Ambulatory Visit (INDEPENDENT_AMBULATORY_CARE_PROVIDER_SITE_OTHER): Payer: Medicare HMO

## 2022-08-11 DIAGNOSIS — I63011 Cerebral infarction due to thrombosis of right vertebral artery: Secondary | ICD-10-CM | POA: Diagnosis not present

## 2022-08-13 LAB — CUP PACEART REMOTE DEVICE CHECK
Date Time Interrogation Session: 20240317231319
Implantable Pulse Generator Implant Date: 20211014

## 2022-08-21 NOTE — Progress Notes (Signed)
[facility] Summary Report / Loop Recorder 

## 2022-09-15 ENCOUNTER — Ambulatory Visit (INDEPENDENT_AMBULATORY_CARE_PROVIDER_SITE_OTHER): Payer: Medicare HMO

## 2022-09-15 DIAGNOSIS — I63011 Cerebral infarction due to thrombosis of right vertebral artery: Secondary | ICD-10-CM

## 2022-09-16 LAB — CUP PACEART REMOTE DEVICE CHECK
Date Time Interrogation Session: 20240419230502
Implantable Pulse Generator Implant Date: 20211014

## 2022-09-23 DIAGNOSIS — E782 Mixed hyperlipidemia: Secondary | ICD-10-CM | POA: Diagnosis not present

## 2022-09-23 DIAGNOSIS — I129 Hypertensive chronic kidney disease with stage 1 through stage 4 chronic kidney disease, or unspecified chronic kidney disease: Secondary | ICD-10-CM | POA: Diagnosis not present

## 2022-09-23 DIAGNOSIS — E119 Type 2 diabetes mellitus without complications: Secondary | ICD-10-CM | POA: Diagnosis not present

## 2022-09-23 NOTE — Progress Notes (Signed)
[facility] Summary Report / Loop Recorder 

## 2022-09-29 DIAGNOSIS — N184 Chronic kidney disease, stage 4 (severe): Secondary | ICD-10-CM | POA: Diagnosis not present

## 2022-09-29 DIAGNOSIS — E782 Mixed hyperlipidemia: Secondary | ICD-10-CM | POA: Diagnosis not present

## 2022-09-29 DIAGNOSIS — Z8673 Personal history of transient ischemic attack (TIA), and cerebral infarction without residual deficits: Secondary | ICD-10-CM | POA: Diagnosis not present

## 2022-09-29 DIAGNOSIS — Z9189 Other specified personal risk factors, not elsewhere classified: Secondary | ICD-10-CM | POA: Diagnosis not present

## 2022-09-29 DIAGNOSIS — I1310 Hypertensive heart and chronic kidney disease without heart failure, with stage 1 through stage 4 chronic kidney disease, or unspecified chronic kidney disease: Secondary | ICD-10-CM | POA: Diagnosis not present

## 2022-09-29 DIAGNOSIS — G8929 Other chronic pain: Secondary | ICD-10-CM | POA: Diagnosis not present

## 2022-09-29 DIAGNOSIS — G4700 Insomnia, unspecified: Secondary | ICD-10-CM | POA: Diagnosis not present

## 2022-09-29 DIAGNOSIS — E1122 Type 2 diabetes mellitus with diabetic chronic kidney disease: Secondary | ICD-10-CM | POA: Diagnosis not present

## 2022-09-29 DIAGNOSIS — I129 Hypertensive chronic kidney disease with stage 1 through stage 4 chronic kidney disease, or unspecified chronic kidney disease: Secondary | ICD-10-CM | POA: Diagnosis not present

## 2022-10-13 DIAGNOSIS — I129 Hypertensive chronic kidney disease with stage 1 through stage 4 chronic kidney disease, or unspecified chronic kidney disease: Secondary | ICD-10-CM | POA: Diagnosis not present

## 2022-10-13 DIAGNOSIS — F17210 Nicotine dependence, cigarettes, uncomplicated: Secondary | ICD-10-CM | POA: Diagnosis not present

## 2022-10-13 DIAGNOSIS — Z79899 Other long term (current) drug therapy: Secondary | ICD-10-CM | POA: Diagnosis not present

## 2022-10-16 LAB — CUP PACEART REMOTE DEVICE CHECK
Date Time Interrogation Session: 20240522230311
Implantable Pulse Generator Implant Date: 20211014

## 2022-10-21 ENCOUNTER — Ambulatory Visit (INDEPENDENT_AMBULATORY_CARE_PROVIDER_SITE_OTHER): Payer: Medicare HMO

## 2022-10-21 DIAGNOSIS — I639 Cerebral infarction, unspecified: Secondary | ICD-10-CM

## 2022-10-21 NOTE — Progress Notes (Unsigned)
Referring Provider: Leone Payor, FNP Primary Care Physician:  Leone Payor, FNP Primary GI Physician: Dr. Jena Gauss  Chief Complaint  Patient presents with   Colonoscopy    Colonoscopy screening     HPI:   Ryan Butler is a 72 y.o. male with medical history significant for CVAs, last one in 2021. Has residual left eye vision changes and left hand weakness. Also with history of vascular dementia, chronic back/neck pain, HTN, DM, PVD, implanted loop recorder due to history of unexplained strokes, polysubstance abuse, adenomatous colon polyps including tubulovillous adenoma in 2007 and tubular adenoma in 2003 and 2010, presenting today to discuss scheduling colonoscopy.   He was last seen in our office 10/15/2021 due to positive Cologuard.  Reported notable weight loss over the last year, but unclear how much.  Not always eating well.  Denies abdominal pain, nausea, vomiting, lower GI symptoms, BRBPR, melena.  He was scheduled for colonoscopy, but this was never completed he was hospitalized for suicide attempt.  Today:  Current residing at [facility].  Reports he is doing well overall.  Denies abdominal pain, constipation, diarrhea, BRBPR, melena.  Prior issues with weight loss have resolved and he has gained his weight back.  No nausea, vomiting, reflux symptoms, dysphagia.  He does take pantoprazole daily.  Reports both of his younger brothers had colon cancer.  Unable to tell me the age.  He has had at least 3 colonoscopies in the past: Colonoscopy in 2003 with 2 polyps removed.  Pathology revealed adenomatous polyp. Colonoscopy in 2007 with 8 mm polyp in the descending colon removed.  Pathology with tubulovillous adenoma. Pathology with tubular adenoma in 2010.  Endoscopy report not on file.  Past Medical History:  Diagnosis Date   Carotid artery disease (HCC)    Cervical spondylosis    Closed head injury 1996   Cognitive impairment    Essential hypertension, benign     History of colonic polyps    History of scarlet fever    History of seizures    unknown etiology and on no meds; last seizure was 2011   History of TIAs    Hyperlipidemia    Insomnia    Paranoia (HCC)    Polysubstance abuse (HCC)    History of cocaine in his 30's   Stroke (HCC) 12/2019   hx of strokes per pt, weakness of left side   Type 2 diabetes mellitus (HCC)    Pt stopped metformin 09/2013   Vision abnormalities    no peripheral vision in left eye    Past Surgical History:  Procedure Laterality Date   ANTERIOR CERVICAL DECOMP/DISCECTOMY FUSION N/A 12/05/2013   Procedure: ANTERIOR CERVICAL DECOMPRESSION/DISCECTOMY FUSION 2 LEVELS;  Surgeon: Mariam Dollar, MD;  Location: [facility];  Service: Neurosurgery;  Laterality: N/A;  Anterior Cervical Discectomy and Fusion with PEEK Cages and allograft Cervical Four-Five/Five-Six   BUBBLE STUDY  03/08/2020   Procedure: BUBBLE STUDY;  Surgeon: Vesta Mixer, MD;  Location: [facility] ENDOSCOPY;  Service: Cardiovascular;;   CATARACT EXTRACTION W/PHACO Left 05/24/2018   Procedure: CATARACT EXTRACTION PHACO AND INTRAOCULAR LENS PLACEMENT (IOC);  Surgeon: Gemma Payor, MD;  Location: [facility];  Service: Ophthalmology;  Laterality: Left;  CDE: 7.67   CATARACT EXTRACTION W/PHACO Right 11/21/2019   Procedure: CATARACT EXTRACTION PHACO AND INTRAOCULAR LENS PLACEMENT RIGHT EYE;  Surgeon: Fabio Pierce, MD;  Location: [facility];  Service: Ophthalmology;  Laterality: Right;  CDE: 6.26   FRACTURE SURGERY  Hand laceration repair Left    Left carotid endarterectomy     6/06 - Dr. Edilia Bo   LOOP RECORDER INSERTION N/A 03/08/2020   Procedure: LOOP RECORDER INSERTION;  Surgeon: Hillis Range, MD;  Location: [facility] INVASIVE CV LAB;  Service: Cardiovascular;  Laterality: N/A;   skin graft to leg Left    TEE WITHOUT CARDIOVERSION N/A 03/08/2020   Procedure: TRANSESOPHAGEAL ECHOCARDIOGRAM (TEE);  Surgeon: Vesta Mixer, MD;  Location: [facility] ENDOSCOPY;  Service:  Cardiovascular;  Laterality: N/A;  loop after   Tibia/fibula fracture repair Left    VASCULAR SURGERY      Current Outpatient Medications  Medication Sig Dispense Refill   ACCU-CHEK AVIVA PLUS test strip USE ONE STRIP TO CHECK GLUCOSE TWICE DAILY 90 each 1   ACCU-CHEK SOFTCLIX LANCETS lancets USE ONE LANCET TWICE DAILY AS DIRECTED TO CHECK BLOOD GLUCOSE 90 each 1   amitriptyline (ELAVIL) 150 MG tablet TAKE ONE TABLET BY MOUTH AT BEDTIME AS NEEDED FOR SLEEP 90 tablet 1   atorvastatin (LIPITOR) 80 MG tablet Take 1 tablet (80 mg total) by mouth daily at 6 PM. 30 tablet 1   clopidogrel (PLAVIX) 75 MG tablet Take 1 tablet (75 mg total) by mouth daily with breakfast. 30 tablet 11   glipiZIDE (GLUCOTROL XL) 5 MG 24 hr tablet Take 5 mg by mouth daily.     ibuprofen (ADVIL) 200 MG tablet Take 400 mg by mouth every 4 (four) hours as needed for moderate pain.     losartan (COZAAR) 100 MG tablet Take 100 mg by mouth daily.     pantoprazole (PROTONIX) 40 MG tablet Take 40 mg by mouth daily.     risperiDONE (RISPERDAL) 2 MG tablet Take 2 mg by mouth 2 (two) times daily.     No current facility-administered medications for this visit.    Allergies as of 10/23/2022 - Review Complete 10/23/2022  Allergen Reaction Noted   Aspirin Nausea And Vomiting 01/08/2017   Hydrocodone Nausea Only 04/16/2015    Family History  Problem Relation Age of Onset   Coronary artery disease Mother    Diabetes Mother    Cancer Father        throat   Fibromyalgia Sister    Cancer Brother        colon   Cancer Brother        colon    Social History   Socioeconomic History   Marital status: Divorced    Spouse name: Not on file   Number of children: 2   Years of education: 9   Highest education level: 9th grade  Occupational History   Not on file  Tobacco Use   Smoking status: Every Day    Packs/day: 0.50    Years: 60.00    Additional pack years: 0.00    Total pack years: 30.00    Types: Cigarettes     Last attempt to quit: 12/17/2019    Years since quitting: 2.8   Smokeless tobacco: Never   Tobacco comments:    smokes 3/4 pack per day now 09/19/21  Vaping Use   Vaping Use: Never used  Substance and Sexual Activity   Alcohol use: Not Currently    Comment: seldom, 09/19/21 none, patient states last etoh a "right good while"   Drug use: Not Currently    Types: Marijuana, Oxycodone    Comment: 01/17/20 uses daily; 09/23/21 once a day   Sexual activity: Not Currently    Birth control/protection: None  Other Topics  Concern   Not on file  Social History Narrative   09/19/21 lives alone   Caffeine- heavy use   Social Determinants of Health   Financial Resource Strain: Not on file  Food Insecurity: Not on file  Transportation Needs: Not on file  Physical Activity: Not on file  Stress: Not on file  Social Connections: Not on file    Review of Systems: Gen: Denies fever, chills, cold or flulike symptoms, presyncope, syncope. CV: Denies chest pain, palpitations. Resp: Denies dyspnea at rest, cough. GI: See HPI Heme: See HPI  Physical Exam: BP 139/79 (BP Location: Right Arm, Patient Position: Sitting, Cuff Size: Normal)   Pulse 88   Temp 97.7 F (36.5 C) (Temporal)   Ht 5\' 10"  (1.778 m)   Wt 157 lb 3.2 oz (71.3 kg)   SpO2 99%   BMI 22.56 kg/m  General:   Alert and oriented. No distress noted. Pleasant and cooperative.  Walking with a cane. Head:  Normocephalic and atraumatic. Eyes:  Conjuctiva clear without scleral icterus. Heart:  S1, S2 present without murmurs appreciated. Lungs:  Clear to auscultation bilaterally. No wheezes, rales, or rhonchi. No distress.  Abdomen:  +BS, soft, non-tender and non-distended. No rebound or guarding. No HSM or masses noted. Msk:  Symmetrical without gross deformities. Normal posture. Extremities:  Without edema. Neurologic:  Alert and  oriented x4 Psych:  Normal mood and affect.    Assessment:  72 y.o. male with medical history  significant for CVAs, last one in 2021. Has residual left eye vision changes and left hand weakness. Also with history of vascular dementia, chronic back/neck pain, HTN, DM, PVD, implanted loop recorder due to history of unexplained strokes, polysubstance abuse, adenomatous colon polyps including tubulovillous adenoma in 2007 and tubular adenoma in 2003 and 2010, presenting today to discuss scheduling colonoscopy.  He is doing well at this time with no significant GI symptoms or alarm symptoms.  We did see him in the office last year due to positive Cologuard and recommended colonoscopy, but this was never completed.  He also tells me today that both of his younger brothers have had colon cancer, but unable to tell me how old they were at diagnosis.   Plan:  Proceed with colonoscopy with propofol by Dr. Jena Gauss in near future. The risks, benefits, and alternatives have been discussed with the patient in detail. The patient states understanding and desires to proceed.  ASA 3 Patient is on Plavix and will continue this for procedure. No AM diabetes medications day of procedure.  Follow-up PRN.    Ermalinda Memos, PA-C [facility] 10/23/2022

## 2022-10-22 NOTE — Progress Notes (Signed)
[facility] Summary Report / Loop Recorder 

## 2022-10-23 ENCOUNTER — Encounter: Payer: Self-pay | Admitting: Gastroenterology

## 2022-10-23 ENCOUNTER — Ambulatory Visit (INDEPENDENT_AMBULATORY_CARE_PROVIDER_SITE_OTHER): Payer: Medicare HMO | Admitting: Gastroenterology

## 2022-10-23 VITALS — BP 139/79 | HR 88 | Temp 97.7°F | Ht 70.0 in | Wt 157.2 lb

## 2022-10-23 DIAGNOSIS — I739 Peripheral vascular disease, unspecified: Secondary | ICD-10-CM

## 2022-10-23 DIAGNOSIS — Z95810 Presence of automatic (implantable) cardiac defibrillator: Secondary | ICD-10-CM

## 2022-10-23 DIAGNOSIS — Z800 Family history of malignant neoplasm of digestive organs: Secondary | ICD-10-CM

## 2022-10-23 DIAGNOSIS — I69398 Other sequelae of cerebral infarction: Secondary | ICD-10-CM

## 2022-10-23 DIAGNOSIS — E119 Type 2 diabetes mellitus without complications: Secondary | ICD-10-CM

## 2022-10-23 DIAGNOSIS — I10 Essential (primary) hypertension: Secondary | ICD-10-CM | POA: Diagnosis not present

## 2022-10-23 DIAGNOSIS — R195 Other fecal abnormalities: Secondary | ICD-10-CM | POA: Diagnosis not present

## 2022-10-23 DIAGNOSIS — Z86010 Personal history of colonic polyps: Secondary | ICD-10-CM | POA: Diagnosis not present

## 2022-10-23 NOTE — Patient Instructions (Addendum)
Arrange to have a colonoscopy in the near future with Dr. Jena Gauss at [facility].  Will follow-up with you in the office as needed.    It was good to meet you today!  Ermalinda Memos, PA-C [facility]

## 2022-10-28 ENCOUNTER — Telehealth: Payer: Self-pay | Admitting: *Deleted

## 2022-10-28 NOTE — Telephone Encounter (Signed)
Called to schedule pt for his colonoscopy, was told by family member that pt was asleep and she would have him call the office once he gets up.  TCS w/Dr/Rourk, asa 3, pt has a loop recorder

## 2022-10-29 NOTE — Telephone Encounter (Signed)
LMTRC

## 2022-11-17 NOTE — Progress Notes (Signed)
[facility] Summary Report / Loop Recorder 

## 2022-11-18 LAB — CUP PACEART REMOTE DEVICE CHECK
Date Time Interrogation Session: 20240624230228
Implantable Pulse Generator Implant Date: 20211014

## 2022-11-24 ENCOUNTER — Ambulatory Visit (INDEPENDENT_AMBULATORY_CARE_PROVIDER_SITE_OTHER): Payer: Medicare HMO

## 2022-11-24 DIAGNOSIS — I639 Cerebral infarction, unspecified: Secondary | ICD-10-CM | POA: Diagnosis not present

## 2022-12-09 MED ORDER — FLEET ENEMA 7-19 GM/118ML RE ENEM: 1.0000 | ENEMA | Freq: Once | RECTAL | 0 refills | Status: AC

## 2022-12-09 MED ORDER — PEG 3350-KCL-NA BICARB-NACL 420 G PO SOLR: 4000.0000 mL | Freq: Once | ORAL | 0 refills | Status: AC

## 2022-12-09 MED ORDER — BISACODYL 5 MG PO TBEC: 10.0000 mg | DELAYED_RELEASE_TABLET | Freq: Once | ORAL | 0 refills | Status: AC

## 2022-12-09 NOTE — Telephone Encounter (Addendum)
Called # listed which is for [facility]. Spoke with Daphne. Patient is resident there. He is scheduled for procedure 8/7. Aware will [facility]. Instructions to be mailed to PO box listed and will also fax to pharmacy [facility] per daphne. Advised will call back with pre-op appt.

## 2022-12-09 NOTE — Addendum Note (Signed)
Addended by: Armstead Peaks on: 12/09/2022 11:41 AM   Modules accepted: Orders

## 2022-12-10 NOTE — Telephone Encounter (Signed)
PA approved via cohere. Auth# [id number], DOS 12/31/2022 - 03/02/2023  [facility] and spoke with [place]. Gave pre-op appt details.

## 2022-12-15 NOTE — Progress Notes (Signed)
[facility] Summary Report / Loop Recorder 

## 2022-12-26 NOTE — Patient Instructions (Signed)
Ryan Butler  12/26/2022     @PREFPERIOPPHARMACY @   Your procedure is scheduled on  12/31/2022.   Report to [facility] at  0730  A.M.   Call this number if you have problems the morning of surgery:  [phone]  If you experience any cold or flu symptoms such as cough, fever, chills, shortness of breath, etc. between now and your scheduled surgery, please notify us at the above number.   Remember:  Follow the diet and prep instructions given to you by the office.     Take these medicines the morning of surgery with A SIP OF WATER                           pantoprazole, risperidone.     Do not wear jewelry, make-up or nail polish, including gel polish,  artificial nails, or any other type of covering on natural nails (fingers and  toes).  Do not wear lotions, powders, or perfumes, or deodorant.  Do not shave 48 hours prior to surgery.  Men may shave face and neck.  Do not bring valuables to the hospital.  [facility] is not responsible for any belongings or valuables.  Contacts, dentures or bridgework may not be worn into surgery.  Leave your suitcase in the car.  After surgery it may be brought to your room.  For patients admitted to the hospital, discharge time will be determined by your treatment team.  Patients discharged the day of surgery will not be allowed to drive home and must have someone with them for 24 hours.    Special instructions:   DO NOT smoke tobacco or vape for 24 hours before your procedure.  Please read over the following fact sheets that you were given. Anesthesia Post-op Instructions and Care and Recovery After Surgery      Colonoscopy, Adult, Care After The following information offers guidance on how to care for yourself after your procedure. Your health care provider may also give you more specific instructions. If you have problems or questions, contact your health care provider. What can I expect after the procedure? After the  procedure, it is common to have: A small amount of blood in your stool for 24 hours after the procedure. Some gas. Mild cramping or bloating of your abdomen. Follow these instructions at home: Eating and drinking  Drink enough fluid to keep your urine pale yellow. Follow instructions from your health care provider about eating or drinking restrictions. Resume your normal diet as told by your health care provider. Avoid heavy or fried foods that are hard to digest. Activity Rest as told by your health care provider. Avoid sitting for a long time without moving. Get up to take short walks every 1-2 hours. This is important to improve blood flow and breathing. Ask for help if you feel weak or unsteady. Return to your normal activities as told by your health care provider. Ask your health care provider what activities are safe for you. Managing cramping and bloating  Try walking around when you have cramps or feel bloated. If directed, apply heat to your abdomen as told by your health care provider. Use the heat source that your health care provider recommends, such as a moist heat pack or a heating pad. Place a towel between your skin and the heat source. Leave the heat on for 20-30 minutes. Remove the heat if your skin turns bright  red. This is especially important if you are unable to feel pain, heat, or cold. You have a greater risk of getting burned. General instructions If you were given a sedative during the procedure, it can affect you for several hours. Do not drive or operate machinery until your health care provider says that it is safe. For the first 24 hours after the procedure: Do not sign important documents. Do not drink alcohol. Do your regular daily activities at a slower pace than normal. Eat soft foods that are easy to digest. Take over-the-counter and prescription medicines only as told by your health care provider. Keep all follow-up visits. This is important. Contact  a health care provider if: You have blood in your stool 2-3 days after the procedure. Get help right away if: You have more than a small spotting of blood in your stool. You have large blood clots in your stool. You have swelling of your abdomen. You have nausea or vomiting. You have a fever. You have increasing pain in your abdomen that is not relieved with medicine. These symptoms may be an emergency. Get help right away. Call 911. Do not wait to see if the symptoms will go away. Do not drive yourself to the hospital. Summary After the procedure, it is common to have a small amount of blood in your stool. You may also have mild cramping and bloating of your abdomen. If you were given a sedative during the procedure, it can affect you for several hours. Do not drive or operate machinery until your health care provider says that it is safe. Get help right away if you have a lot of blood in your stool, nausea or vomiting, a fever, or increased pain in your abdomen. This information is not intended to replace advice given to you by your health care provider. Make sure you discuss any questions you have with your health care provider. Document Revised: 06/24/2022 Document Reviewed: 01/02/2021 Elsevier Patient Education  2024 Else[employer] Inc. Monitored Anesthesia Care, Care After The following information offers guidance on how to care for yourself after your procedure. Your health care provider may also give you more specific instructions. If you have problems or questions, contact your health care provider. What can I expect after the procedure? After the procedure, it is common to have: Tiredness. Little or no memory about what happened during or after the procedure. Impaired judgment when it comes to making decisions. Nausea or vomiting. Some trouble with balance. Follow these instructions at home: For the time period you were told by your health care provider:  Rest. Do not participate  in activities where you could fall or become injured. Do not drive or use machinery. Do not drink alcohol. Do not take sleeping pills or medicines that cause drowsiness. Do not make important decisions or sign legal documents. Do not take care of children on your own. Medicines Take over-the-counter and prescription medicines only as told by your health care provider. If you were prescribed antibiotics, take them as told by your health care provider. Do not stop using the antibiotic even if you start to feel better. Eating and drinking Follow instructions from your health care provider about what you may eat and drink. Drink enough fluid to keep your urine pale yellow. If you vomit: Drink clear fluids slowly and in small amounts as you are able. Clear fluids include water, ice chips, low-calorie sports drinks, and fruit juice that has water added to it (diluted fruit juice). Eat light  and bland foods in small amounts as you are able. These foods include bananas, applesauce, rice, lean meats, toast, and crackers. General instructions  Have a responsible adult stay with you for the time you are told. It is important to have someone help care for you until you are awake and alert. If you have sleep apnea, surgery and some medicines can increase your risk for breathing problems. Follow instructions from your health care provider about wearing your sleep device: When you are sleeping. This includes during daytime naps. While taking prescription pain medicines, sleeping medicines, or medicines that make you drowsy. Do not use any products that contain nicotine or tobacco. These products include cigarettes, chewing tobacco, and vaping devices, such as e-cigarettes. If you need help quitting, ask your health care provider. Contact a health care provider if: You feel nauseous or vomit every time you eat or drink. You feel light-headed. You are still sleepy or having trouble with balance after 24  hours. You get a rash. You have a fever. You have redness or swelling around the IV site. Get help right away if: You have trouble breathing. You have new confusion after you get home. These symptoms may be an emergency. Get help right away. Call 911. Do not wait to see if the symptoms will go away. Do not drive yourself to the hospital. This information is not intended to replace advice given to you by your health care provider. Make sure you discuss any questions you have with your health care provider. Document Revised: 10/07/2021 Document Reviewed: 10/07/2021 Elsevier Patient Education  2024 [employer].

## 2022-12-29 ENCOUNTER — Encounter: Payer: Self-pay | Admitting: *Deleted

## 2022-12-29 ENCOUNTER — Other Ambulatory Visit: Payer: Self-pay | Admitting: *Deleted

## 2022-12-29 ENCOUNTER — Encounter (HOSPITAL_COMMUNITY)
Admission: RE | Admit: 2022-12-29 | Discharge: 2022-12-29 | Disposition: A | Discharge status: Home / Self Care | Payer: Medicare HMO | Source: Ambulatory Visit | Attending: Internal Medicine | Admitting: Internal Medicine

## 2022-12-29 ENCOUNTER — Ambulatory Visit (INDEPENDENT_AMBULATORY_CARE_PROVIDER_SITE_OTHER): Payer: Medicare HMO

## 2022-12-29 VITALS — BP 157/79 | HR 82 | Temp 97.7°F | Resp 18 | Ht 70.0 in | Wt 157.2 lb

## 2022-12-29 DIAGNOSIS — I639 Cerebral infarction, unspecified: Secondary | ICD-10-CM | POA: Diagnosis not present

## 2022-12-29 DIAGNOSIS — E119 Type 2 diabetes mellitus without complications: Secondary | ICD-10-CM | POA: Insufficient documentation

## 2022-12-29 DIAGNOSIS — F17210 Nicotine dependence, cigarettes, uncomplicated: Secondary | ICD-10-CM | POA: Insufficient documentation

## 2022-12-29 DIAGNOSIS — I10 Essential (primary) hypertension: Secondary | ICD-10-CM | POA: Insufficient documentation

## 2022-12-29 DIAGNOSIS — Z01818 Encounter for other preprocedural examination: Secondary | ICD-10-CM | POA: Insufficient documentation

## 2022-12-29 DIAGNOSIS — F17200 Nicotine dependence, unspecified, uncomplicated: Secondary | ICD-10-CM

## 2022-12-29 LAB — BASIC METABOLIC PANEL
Anion gap: 4 — ABNORMAL LOW (ref 5–15)
BUN: 14 mg/dL (ref 8–23)
CO2: 27 mmol/L (ref 22–32)
Calcium: 8.8 mg/dL — ABNORMAL LOW (ref 8.9–10.3)
Chloride: 104 mmol/L (ref 98–111)
Creatinine, Ser: 1.02 mg/dL (ref 0.61–1.24)
GFR, Estimated: >60 mL/min (ref >60)
Glucose, Bld: 157 mg/dL — ABNORMAL HIGH (ref 70–99)
Potassium: 3.8 mmol/L (ref 3.5–5.1)
Sodium: 135 mmol/L (ref 135–145)

## 2022-12-29 MED ORDER — PEG 3350-KCL-NA BICARB-NACL 420 G PO SOLR: 4000.0000 mL | Freq: Once | ORAL | 0 refills | Status: AC

## 2022-12-29 NOTE — Progress Notes (Signed)
Daughter in law called in. Patient in group home but owner had a stroke. No one at group has any info on patient procedure Wednesday and she is trying to get everything in order for pt; he is going to pre-op today. She will get his mychart registered to send instructions. New rx for prep sent to [facility] also.

## 2022-12-31 ENCOUNTER — Encounter (HOSPITAL_COMMUNITY): Payer: Self-pay | Admitting: Internal Medicine

## 2022-12-31 ENCOUNTER — Encounter (HOSPITAL_COMMUNITY)
Admission: RE | Disposition: A | Discharge status: Home / Self Care | Payer: Self-pay | Source: Ambulatory Visit | Attending: Internal Medicine

## 2022-12-31 ENCOUNTER — Ambulatory Visit (HOSPITAL_BASED_OUTPATIENT_CLINIC_OR_DEPARTMENT_OTHER): Payer: Medicare HMO | Admitting: Anesthesiology

## 2022-12-31 ENCOUNTER — Ambulatory Visit (HOSPITAL_COMMUNITY)
Admission: RE | Admit: 2022-12-31 | Discharge: 2022-12-31 | Disposition: A | Discharge status: Home / Self Care | Payer: Medicare HMO | Source: Ambulatory Visit | Attending: Internal Medicine | Admitting: Internal Medicine

## 2022-12-31 ENCOUNTER — Ambulatory Visit (HOSPITAL_COMMUNITY): Payer: Medicare HMO | Admitting: Anesthesiology

## 2022-12-31 DIAGNOSIS — F17210 Nicotine dependence, cigarettes, uncomplicated: Secondary | ICD-10-CM

## 2022-12-31 DIAGNOSIS — Z800 Family history of malignant neoplasm of digestive organs: Secondary | ICD-10-CM | POA: Diagnosis not present

## 2022-12-31 DIAGNOSIS — R195 Other fecal abnormalities: Secondary | ICD-10-CM | POA: Diagnosis not present

## 2022-12-31 DIAGNOSIS — Z981 Arthrodesis status: Secondary | ICD-10-CM | POA: Diagnosis not present

## 2022-12-31 DIAGNOSIS — E119 Type 2 diabetes mellitus without complications: Secondary | ICD-10-CM | POA: Diagnosis not present

## 2022-12-31 DIAGNOSIS — Z538 Procedure and treatment not carried out for other reasons: Secondary | ICD-10-CM | POA: Diagnosis not present

## 2022-12-31 DIAGNOSIS — I10 Essential (primary) hypertension: Secondary | ICD-10-CM | POA: Insufficient documentation

## 2022-12-31 DIAGNOSIS — E1151 Type 2 diabetes mellitus with diabetic peripheral angiopathy without gangrene: Secondary | ICD-10-CM | POA: Insufficient documentation

## 2022-12-31 DIAGNOSIS — Z1211 Encounter for screening for malignant neoplasm of colon: Secondary | ICD-10-CM

## 2022-12-31 DIAGNOSIS — Z7984 Long term (current) use of oral hypoglycemic drugs: Secondary | ICD-10-CM | POA: Diagnosis not present

## 2022-12-31 DIAGNOSIS — Z86010 Personal history of colonic polyps: Secondary | ICD-10-CM | POA: Diagnosis not present

## 2022-12-31 DIAGNOSIS — Z7902 Long term (current) use of antithrombotics/antiplatelets: Secondary | ICD-10-CM | POA: Diagnosis not present

## 2022-12-31 HISTORY — PX: COLONOSCOPY WITH PROPOFOL: SHX5780

## 2022-12-31 LAB — GLUCOSE, CAPILLARY: Glucose-Capillary: 116 mg/dL — ABNORMAL HIGH (ref 70–99)

## 2022-12-31 SURGERY — COLONOSCOPY WITH PROPOFOL
Anesthesia: General

## 2022-12-31 MED ORDER — PROPOFOL 10 MG/ML IV BOLUS
INTRAVENOUS | Status: DC | PRN
Start: 2022-12-31 — End: 2022-12-31
  Administered 2022-12-31: 100 mg via INTRAVENOUS

## 2022-12-31 MED ORDER — LACTATED RINGERS IV SOLN: INTRAVENOUS | Status: DC

## 2022-12-31 MED ORDER — PROPOFOL 500 MG/50ML IV EMUL
INTRAVENOUS | Status: DC | PRN
  Administered 2022-12-31: 150 ug/kg/min via INTRAVENOUS

## 2022-12-31 MED ORDER — LIDOCAINE HCL (CARDIAC) PF 100 MG/5ML IV SOSY
PREFILLED_SYRINGE | INTRAVENOUS | Status: DC | PRN
  Administered 2022-12-31: 50 mg via INTRAVENOUS

## 2022-12-31 NOTE — Anesthesia Preprocedure Evaluation (Signed)
Anesthesia Evaluation  Patient identified by MRN, date of birth, ID band Patient awake    Reviewed: Allergy & Precautions, H&P , NPO status , Patient's Chart, lab work & pertinent test results  Airway Mallampati: II  TM Distance: >3 FB Neck ROM: Full   Comment: ACDF Dental  (+) Edentulous Upper, Edentulous Lower   Pulmonary Current Smoker and Patient abstained from smoking.   Pulmonary exam normal breath sounds clear to auscultation       Cardiovascular Exercise Tolerance: Good hypertension, Pt. on medications + Peripheral Vascular Disease  Normal cardiovascular exam Rhythm:Regular Rate:Normal     Neuro/Psych  Headaches PSYCHIATRIC DISORDERS      CVA, Residual Symptoms    GI/Hepatic negative GI ROS,,,(+)     substance abuse (last use of cocaine 30 years ago)  cocaine use and marijuana use  Endo/Other  diabetes, Well Controlled, Type 2, Oral Hypoglycemic Agents    Renal/GU negative Renal ROS  negative genitourinary   Musculoskeletal  (+) Arthritis , Osteoarthritis,    Abdominal   Peds negative pediatric ROS (+)  Hematology negative hematology ROS (+)   Anesthesia Other Findings   Reproductive/Obstetrics negative OB ROS                             Anesthesia Physical Anesthesia Plan  ASA: 3  Anesthesia Plan: General   Post-op Pain Management: Minimal or no pain anticipated   Induction: Intravenous  PONV Risk Score and Plan: 1 and Propofol infusion  Airway Management Planned: Nasal Cannula and Natural Airway  Additional Equipment:   Intra-op Plan:   Post-operative Plan:   Informed Consent: I have reviewed the patients History and Physical, chart, labs and discussed the procedure including the risks, benefits and alternatives for the proposed anesthesia with the patient or authorized representative who has indicated his/her understanding and acceptance.       Plan  Discussed with: CRNA and Surgeon  Anesthesia Plan Comments:         Anesthesia Quick Evaluation

## 2022-12-31 NOTE — Discharge Instructions (Signed)
  Colonoscopy Discharge Instructions  Read the instructions outlined below and refer to this sheet in the next few weeks. These discharge instructions provide you with general information on caring for yourself after you leave the hospital. Your doctor may also give you specific instructions. While your treatment has been planned according to the most current medical practices available, unavoidable complications occasionally occur. If you have any problems or questions after discharge, call Dr. Jena Gauss at [phone]. ACTIVITY You may resume your regular activity, but move at a slower pace for the next 24 hours.  Take frequent rest periods for the next 24 hours.  Walking will help get rid of the air and reduce the bloated feeling in your belly (abdomen).  No driving for 24 hours (because of the medicine (anesthesia) used during the test).   Do not sign any important legal documents or operate any machinery for 24 hours (because of the anesthesia used during the test).  NUTRITION Drink plenty of fluids.  You may resume your normal diet as instructed by your doctor.  Begin with a light meal and progress to your normal diet. Heavy or fried foods are harder to digest and may make you feel sick to your stomach (nauseated).  Avoid alcoholic beverages for 24 hours or as instructed.  MEDICATIONS You may resume your normal medications unless your doctor tells you otherwise.  WHAT YOU CAN EXPECT TODAY Some feelings of bloating in the abdomen.  Passage of more gas than usual.  Spotting of blood in your stool or on the toilet paper.  IF YOU HAD POLYPS REMOVED DURING THE COLONOSCOPY: No aspirin products for 7 days or as instructed.  No alcohol for 7 days or as instructed.  Eat a soft diet for the next 24 hours.  FINDING OUT THE RESULTS OF YOUR TEST Not all test results are available during your visit. If your test results are not back during the visit, make an appointment with your caregiver to find out the  results. Do not assume everything is normal if you have not heard from your caregiver or the medical facility. It is important for you to follow up on all of your test results.  SEEK IMMEDIATE MEDICAL ATTENTION IF: You have more than a spotting of blood in your stool.  Your belly is swollen (abdominal distention).  You are nauseated or vomiting.  You have a temperature over 101.  You have abdominal pain or discomfort that is severe or gets worse throughout the day.       Your colonoscopy could not be completed today because you are not adequately prepped.    Return to the office to reassess and  plan for another colonoscopy in the near future  At patient request, I called Newel Cleere at [phone] findings and recommendations

## 2022-12-31 NOTE — Op Note (Signed)
[facility] Patient Name: Ryan Butler Procedure Date: 12/31/2022 10:14 AM MRN: [id number] Date of Birth: 03-Aug-1950 Attending MD: Gennette Pac , MD, [id number] CSN: [id number] Age: 72 Admit Type: Outpatient Procedure:                Colonoscopy Indications:              Positive Cologuard test Providers:                Gennette Pac, MD, Buel Ream. Thomasena Edis RN, RN,                            Elinor Parkinson, Dyann Ruddle Referring MD:              Medicines:                Propofol per Anesthesia Complications:            No immediate complications. Estimated Blood Loss:     Estimated blood loss: none. Procedure:                Pre-Anesthesia Assessment:                           - Prior to the procedure, a History and Physical                            was performed, and patient medications and                            allergies were reviewed. The patient's tolerance of                            previous anesthesia was also reviewed. The risks                            and benefits of the procedure and the sedation                            options and risks were discussed with the patient.                            All questions were answered, and informed consent                            was obtained. Prior Anticoagulants: The patient has                            taken no anticoagulant or antiplatelet agents and                            last took Plavix (clopidogrel) 1 day prior to the                            procedure. ASA Grade Assessment: III - A patient  with severe systemic disease. After reviewing the                            risks and benefits, the patient was deemed in                            satisfactory condition to undergo the procedure.                           After obtaining informed consent, the colonoscope                            was passed under direct vision. Throughout the                             procedure, the patient's blood pressure, pulse, and                            oxygen saturations were monitored continuously. The                            [id number]) scope was introduced through the                            anus and advanced to the the transverse colon. The                            colonoscopy was performed without difficulty. The                            patient tolerated the procedure well. The quality                            of the bowel preparation was inadequate. Scope In: 10:30:04 AM Scope Out: 10:37:05 AM Total Procedure Duration: 0 hours 7 minutes 1 second  Findings:      The perianal and digital rectal examinations were normal. Inadequate       preparation. Advanced the scope through the left colon into the distal       transverse colon. There was granular viscous stool in the left colon;       stool was more prominent in the transverse colon. It became clear prep       was inadequate and a complete colonoscopy is not feasible today. The       procedure was aborted. Impression:               - Preparation of the colon was inadequate.                           - No specimens collected. Moderate Sedation:      Moderate (conscious) sedation was personally administered by an       anesthesia professional. The following parameters were monitored: oxygen       saturation, heart rate, blood pressure, respiratory rate, EKG, adequacy       of pulmonary ventilation, and response to care. Recommendation:           -  Patient has a contact number available for                            emergencies. The signs and symptoms of potential                            delayed complications were discussed with the                            patient. Return to normal activities tomorrow.                            Written discharge instructions were provided to the                            patient.                           - Advance diet as tolerated.                            - Continue present medications. Return to the                            office. Date not known. Procedure Code(s):        --- Professional ---                           [phone], 53, Colonoscopy, flexible; diagnostic,                            including collection of specimen(s) by brushing or                            washing, when performed (separate procedure) Diagnosis Code(s):        --- Professional ---                           R19.5, Other fecal abnormalities CPT copyright 2022 American Medical Association. All rights reserved. The codes documented in this report are preliminary and upon coder review may  be revised to meet current compliance requirements. Gerrit Friends. , MD Gennette Pac, MD 12/31/2022 10:47:13 AM This report has been signed electronically. Number of Addenda: 0

## 2022-12-31 NOTE — Anesthesia Procedure Notes (Signed)
Date/Time: 12/31/2022 10:29 AM  Performed by: Julian Reil, CRNAPre-anesthesia Checklist: Patient identified, Emergency Drugs available, Suction available and Patient being monitored Patient Re-evaluated:Patient Re-evaluated prior to induction Oxygen Delivery Method: Nasal cannula Induction Type: IV induction Placement Confirmation: positive ETCO2

## 2022-12-31 NOTE — Transfer of Care (Signed)
Immediate Anesthesia Transfer of Care Note  Patient: Ryan Butler  Procedure(s) Performed: COLONOSCOPY WITH PROPOFOL  Patient Location: Short Stay  Anesthesia Type:General  Level of Consciousness: drowsy  Airway & Oxygen Therapy: Patient Spontanous Breathing  Post-op Assessment: Report given to RN and Post -op Vital signs reviewed and stable  Post vital signs: Reviewed and stable  Last Vitals:  Vitals Value Taken Time  BP    Temp    Pulse    Resp    SpO2      Last Pain:  Vitals:   12/31/22 1025  TempSrc:   PainSc: 6          Complications: No notable events documented.

## 2022-12-31 NOTE — Anesthesia Postprocedure Evaluation (Signed)
Anesthesia Post Note  Patient: Ryan Butler  Procedure(s) Performed: COLONOSCOPY WITH PROPOFOL  Patient location during evaluation: Phase II Anesthesia Type: General Level of consciousness: awake and alert and oriented Pain management: pain level controlled Vital Signs Assessment: post-procedure vital signs reviewed and stable Respiratory status: spontaneous breathing, nonlabored ventilation and respiratory function stable Cardiovascular status: blood pressure returned to baseline and stable Postop Assessment: no apparent nausea or vomiting Anesthetic complications: no  No notable events documented.   Last Vitals:  Vitals:   12/31/22 0807 12/31/22 1038  BP: (!) 167/83 (!) 140/68  Pulse: 92 76  Resp: 17 15  Temp: 36.6 C (!) 36.4 C  SpO2: 99% 100%    Last Pain:  Vitals:   12/31/22 1038  TempSrc: Axillary  PainSc: 0-No pain                  C 

## 2022-12-31 NOTE — H&P (Signed)
@LOGO @   Primary Care Physician:  Leone Payor, FNP Primary Gastroenterologist:  Dr. Jena Gauss  Pre-Procedure History & Physical: HPI:  Ryan Butler is a 72 y.o. male here for a colonoscopy.  History of tubulovillous adenoma removed 2007-overdue for surveillance.  Brother with colon cancer.  Positive Cologuard recently.    Remains on Plavix.  Past Medical History:  Diagnosis Date   Carotid artery disease (HCC)    Cervical spondylosis    Closed head injury 1996   Cognitive impairment    Essential hypertension, benign    History of colonic polyps    History of scarlet fever    History of seizures    unknown etiology and on no meds; last seizure was 2011   History of TIAs    Hyperlipidemia    Insomnia    Paranoia (HCC)    Polysubstance abuse (HCC)    History of cocaine in his 30's   Stroke (HCC) 12/2019   hx of strokes per pt, weakness of left side   Type 2 diabetes mellitus (HCC)    Pt stopped metformin 09/2013   Vision abnormalities    no peripheral vision in left eye    Past Surgical History:  Procedure Laterality Date   ANTERIOR CERVICAL DECOMP/DISCECTOMY FUSION N/A 12/05/2013   Procedure: ANTERIOR CERVICAL DECOMPRESSION/DISCECTOMY FUSION 2 LEVELS;  Surgeon: Mariam Dollar, MD;  Location: [facility]URO ORS;  Service: Neurosurgery;  Laterality: N/A;  Anterior Cervical Discectomy and Fusion with PEEK Cages and allograft Cervical Four-Five/Five-Six   BUBBLE STUDY  03/08/2020   Procedure: BUBBLE STUDY;  Surgeon: Vesta Mixer, MD;  Location: [facility] ENDOSCOPY;  Service: Cardiovascular;;   CATARACT EXTRACTION W/PHACO Left 05/24/2018   Procedure: CATARACT EXTRACTION PHACO AND INTRAOCULAR LENS PLACEMENT (IOC);  Surgeon: Gemma Payor, MD;  Location: [facility] ORS;  Service: Ophthalmology;  Laterality: Left;  CDE: 7.67   CATARACT EXTRACTION W/PHACO Right 11/21/2019   Procedure: CATARACT EXTRACTION PHACO AND INTRAOCULAR LENS PLACEMENT RIGHT EYE;  Surgeon: Fabio Pierce, MD;  Location: [facility] ORS;  Service:  Ophthalmology;  Laterality: Right;  CDE: 6.26   FRACTURE SURGERY     Hand laceration repair Left    Left carotid endarterectomy     6/06 - Dr. Edilia Bo   LOOP RECORDER INSERTION N/A 03/08/2020   Procedure: LOOP RECORDER INSERTION;  Surgeon: Hillis Range, MD;  Location: [facility] INVASIVE CV LAB;  Service: Cardiovascular;  Laterality: N/A;   skin graft to leg Left    TEE WITHOUT CARDIOVERSION N/A 03/08/2020   Procedure: TRANSESOPHAGEAL ECHOCARDIOGRAM (TEE);  Surgeon: Elease Hashimoto Deloris Ping, MD;  Location: [facility] ENDOSCOPY;  Service: Cardiovascular;  Laterality: N/A;  loop after   [place]/fibula fracture repair Left    VASCULAR SURGERY      Prior to Admission medications   Medication Sig Start Date End Date Taking? Authorizing Provider  ACCU-CHEK AVIVA PLUS test strip USE ONE STRIP TO CHECK GLUCOSE TWICE DAILY 06/28/17  Yes [place], Betty G, MD  ACCU-CHEK SOFTCLIX LANCETS lancets USE ONE LANCET TWICE DAILY AS DIRECTED TO CHECK BLOOD GLUCOSE 06/28/17  Yes [place], Betty G, MD  amitriptyline (ELAVIL) 150 MG tablet TAKE ONE TABLET BY MOUTH AT BEDTIME AS NEEDED FOR SLEEP 01/07/17  Yes [place], Betty G, MD  atorvastatin (LIPITOR) 80 MG tablet Take 1 tablet (80 mg total) by mouth daily at 6 PM. 12/31/19  Yes Johnson, Clanford L, MD  glipiZIDE (GLUCOTROL XL) 5 MG 24 hr tablet Take 5 mg by mouth daily.   Yes [provider]  ibuprofen (  ADVIL) 200 MG tablet Take 400 mg by mouth every 4 (four) hours as needed for moderate pain.   Yes [provider]  losartan (COZAAR) 100 MG tablet Take 100 mg by mouth daily.   Yes [provider]  pantoprazole (PROTONIX) 40 MG tablet Take 40 mg by mouth daily.   Yes [provider]  risperiDONE (RISPERDAL) 2 MG tablet Take 2 mg by mouth 2 (two) times daily.   Yes [provider]  clopidogrel (PLAVIX) 75 MG tablet Take 1 tablet (75 mg total) by mouth daily with breakfast. 01/09/17   Richarda Overlie, MD    Allergies as of 12/09/2022 - Review Complete  10/23/2022  Allergen Reaction Noted   Aspirin Nausea And Vomiting 01/08/2017   Hydrocodone Nausea Only 04/16/2015    Family History  Problem Relation Age of Onset   Coronary artery disease Mother    Diabetes Mother    Cancer Father        throat   Fibromyalgia Sister    Cancer Brother        colon   Cancer Brother        colon    Social History   Socioeconomic History   Marital status: Divorced    Spouse name: Not on file   Number of children: 2   Years of education: 9   Highest education level: 9th grade  Occupational History   Not on file  Tobacco Use   Smoking status: Every Day    Current packs/day: 0.00    Average packs/day: 0.5 packs/day for 60.0 years (30.0 ttl pk-yrs)    Types: Cigarettes    Start date: 12/17/1959    Last attempt to quit: 12/17/2019    Years since quitting: 3.0   Smokeless tobacco: Never   Tobacco comments:    smokes 3/4 pack per day now 09/19/21  Vaping Use   Vaping status: Never Used  Substance and Sexual Activity   Alcohol use: Not Currently    Comment: seldom, 09/19/21 none, patient states last etoh a "right good while"   Drug use: Not Currently    Types: Marijuana, Oxycodone    Comment: 01/17/20 uses daily; 09/23/21 once a day   Sexual activity: Not Currently    Birth control/protection: None  Other Topics Concern   Not on file  Social History Narrative   09/19/21 lives alone   Caffeine- heavy use   Social Determinants of Corporate investment banker Strain: Not on file  Food Insecurity: Not on file  Transportation Needs: Not on file  Physical Activity: Not on file  Stress: Not on file  Social Connections: Not on file  Intimate Partner Violence: Not on file    Review of Systems: See HPI, otherwise negative ROS  Physical Exam: BP (!) 167/83   Pulse 92   Temp 97.8 F (36.6 C) (Oral)   Resp 17   Ht 5\' 10"  (1.778 m)   Wt 71.3 kg   SpO2 99%   BMI 22.55 kg/m  General:   Alert,  Well-developed, well-nourished, pleasant and  cooperative in NAD Neck:  Supple; no masses or thyromegaly. No significant cervical adenopathy. Lungs:  Clear throughout to auscultation.   No wheezes, crackles, or rhonchi. No acute distress. Heart:  Regular rate and rhythm; no murmurs, clicks, rubs,  or gallops. Abdomen: Non-distended, normal bowel sounds.  Soft and nontender without appreciable mass or hepatosplenomegaly.   Impression/Plan:    72 year old gentleman overdue for surveillance colonoscopy.  Positive family history  of colon cancer.  History of TV adenoma.  Positive Cologuard recently.    He is here for colonoscopy.The risks, benefits, limitations, alternatives and imponderables have been reviewed with the patient. Questions have been answered. All parties are agreeable.      Notice: This dictation was prepared with Dragon dictation along with smaller phrase technology. Any transcriptional errors that result from this process are unintentional and may not be corrected upon review.

## 2023-01-05 ENCOUNTER — Encounter (HOSPITAL_COMMUNITY): Payer: Self-pay | Admitting: Internal Medicine

## 2023-01-10 NOTE — Progress Notes (Unsigned)
Referring Provider: Leone Payor, FNP Primary Care Physician:  Leone Payor, FNP Primary GI Physician: Dr. Jena Gauss  Chief Complaint  Patient presents with   Colonoscopy    Here to reschedule due to bad prep    HPI:   Ryan Butler is a 72 y.o. male with history of CVA with residual left eye vision changes and left hand weakness, vascular dementia, chronic back/neck pain, HTN, DM, PVD, implanted reported history of unexplained strokes, polysubstance abuse, adenomatous colon polyps including TVA in 2007, first-degree relatives with history of colon cancer, presenting today to discuss rescheduling colonoscopy due to poor prep.  Colonoscopy attempted 12/31/2022 with procedure aborted due to poor prep.  Today:  [facility] Presents today with his daughter-in-law.  States he did take bisacodyl and drink all of his prep, followed clear liquid diet as instructed.  The only thing that he was unable to do was the enemas.  States he could not get them in without causing pain.  No one helped him with this.  Has Bms every 2-3 days. Reports stools are hard. Constipation has been present for many years. Started when he took pain medications for about 30 years. None in the last year.  Denies BRBPR, melena, abdominal pain.  Colonoscopy in 2003 with 2 polyps removed.  Pathology revealed adenomatous polyp. Colonoscopy in 2007 with 8 mm polyp in the descending colon removed.  Pathology with tubulovillous adenoma. Pathology with tubular adenoma in 2010.  Endoscopy report not on file.  Past Medical History:  Diagnosis Date   Carotid artery disease (HCC)    Cervical spondylosis    Closed head injury 1996   Cognitive impairment    Essential hypertension, benign    History of colonic polyps    History of scarlet fever    History of seizures    unknown etiology and on no meds; last seizure was 2011   History of TIAs    Hyperlipidemia    Insomnia    Paranoia (HCC)    Polysubstance  abuse (HCC)    History of cocaine in his 30's   Stroke (HCC) 12/2019   hx of strokes per pt, weakness of left side   Type 2 diabetes mellitus (HCC)    Pt stopped metformin 09/2013   Vision abnormalities    no peripheral vision in left eye    Past Surgical History:  Procedure Laterality Date   ANTERIOR CERVICAL DECOMP/DISCECTOMY FUSION N/A 12/05/2013   Procedure: ANTERIOR CERVICAL DECOMPRESSION/DISCECTOMY FUSION 2 LEVELS;  Surgeon: Mariam Dollar, MD;  [facility]: [facility];  Service: Neurosurgery;  Laterality: N/A;  Anterior Cervical Discectomy and Fusion with PEEK Cages and allograft Cervical Four-Five/Five-Six   BUBBLE STUDY  03/08/2020   Procedure: BUBBLE STUDY;  Surgeon: Vesta Mixer, MD;  Location: [facility] ENDOSCOPY;  Service: Cardiovascular;;   CATARACT EXTRACTION W/PHACO Left 05/24/2018   Procedure: CATARACT EXTRACTION PHACO AND INTRAOCULAR LENS PLACEMENT (IOC);  Surgeon: Gemma Payor, MD;  Location: [facility] ORS;  Service: Ophthalmology;  Laterality: Left;  CDE: 7.67   CATARACT EXTRACTION W/PHACO Right 11/21/2019   Procedure: CATARACT EXTRACTION PHACO AND INTRAOCULAR LENS PLACEMENT RIGHT EYE;  Surgeon: Fabio Pierce, MD;  Location: [facility] ORS;  Service: Ophthalmology;  Laterality: Right;  CDE: 6.26   COLONOSCOPY WITH PROPOFOL N/A 12/31/2022   Procedure: COLONOSCOPY WITH PROPOFOL;  Surgeon: Corbin Ade, MD;  Location: [facility] ENDO SUITE;  Service: Endoscopy;  Laterality: N/A;  945am, asa 3, group home resident   FRACTURE SURGERY  Hand laceration repair Left    Left carotid endarterectomy     6/06 - Dr. Edilia Bo   LOOP RECORDER INSERTION N/A 03/08/2020   Procedure: LOOP RECORDER INSERTION;  Surgeon: Hillis Range, MD;  Location: [facility] INVASIVE CV LAB;  Service: Cardiovascular;  Laterality: N/A;   skin graft to leg Left    TEE WITHOUT CARDIOVERSION N/A 03/08/2020   Procedure: TRANSESOPHAGEAL ECHOCARDIOGRAM (TEE);  Surgeon: Vesta Mixer, MD;  Location: [facility] ENDOSCOPY;  Service: Cardiovascular;   Laterality: N/A;  loop after   Tibia/fibula fracture repair Left    VASCULAR SURGERY      Current Outpatient Medications  Medication Sig Dispense Refill   ACCU-CHEK AVIVA PLUS test strip USE ONE STRIP TO CHECK GLUCOSE TWICE DAILY 90 each 1   ACCU-CHEK SOFTCLIX LANCETS lancets USE ONE LANCET TWICE DAILY AS DIRECTED TO CHECK BLOOD GLUCOSE 90 each 1   acetaminophen (TYLENOL) 650 MG CR tablet Take by mouth 2 (two) times daily.     amitriptyline (ELAVIL) 150 MG tablet TAKE ONE TABLET BY MOUTH AT BEDTIME AS NEEDED FOR SLEEP 90 tablet 1   atorvastatin (LIPITOR) 80 MG tablet Take 1 tablet (80 mg total) by mouth daily at 6 PM. 30 tablet 1   clopidogrel (PLAVIX) 75 MG tablet Take 1 tablet (75 mg total) by mouth daily with breakfast. 30 tablet 11   glipiZIDE (GLUCOTROL XL) 5 MG 24 hr tablet Take 5 mg by mouth daily.     losartan (COZAAR) 100 MG tablet Take 100 mg by mouth daily.     melatonin 3 MG TABS tablet Take 3 mg by mouth at bedtime.     pantoprazole (PROTONIX) 40 MG tablet Take 40 mg by mouth daily.     polyethylene glycol powder (MIRALAX) 17 GM/SCOOP powder Take 17 g by mouth daily. 527 g 3   risperiDONE (RISPERDAL) 2 MG tablet Take 2 mg by mouth 2 (two) times daily.     No current facility-administered medications for this visit.    Allergies as of 01/12/2023 - Review Complete 01/12/2023  Allergen Reaction Noted   Aspirin Nausea And Vomiting 01/08/2017   Hydrocodone Nausea Only 04/16/2015    Family History  Problem Relation Age of Onset   Coronary artery disease Mother    Diabetes Mother    Cancer Father        throat   Fibromyalgia Sister    Cancer Brother        colon   Cancer Brother        colon    Social History   Socioeconomic History   Marital status: Divorced    Spouse name: Not on file   Number of children: 2   Years of education: 9   Highest education level: 9th grade  Occupational History   Not on file  Tobacco Use   Smoking status: Every Day    Current  packs/day: 0.25    Average packs/day: 0.5 packs/day for 63.1 years (30.8 ttl pk-yrs)    Types: Cigarettes    Start date: 12/17/1959   Smokeless tobacco: Never   Tobacco comments:    smokes 3/4 pack per day now 09/19/21  Vaping Use   Vaping status: Never Used  Substance and Sexual Activity   Alcohol use: Not Currently    Comment: seldom, 09/19/21 none, patient states last etoh a "right good while"   Drug use: Not Currently    Types: Marijuana, Oxycodone    Comment: 01/17/20 uses daily; 09/23/21 once a day  Sexual activity: Not Currently    Birth control/protection: None  Other Topics Concern   Not on file  Social History Narrative   09/19/21 lives alone   Caffeine- heavy use   Social Determinants of Health   Financial Resource Strain: Not on file  Food Insecurity: Not on file  Transportation Needs: Not on file  Physical Activity: Not on file  Stress: Not on file  Social Connections: Not on file    Review of Systems: Gen: Denies fever, chills, cold or flulike symptoms, presyncope, syncope. CV: Denies chest pain, palpitations. Resp: Denies dyspnea, cough. GI: See HPI  Heme: See HPI  Physical Exam: BP (!) 156/76 (BP Location: Right Arm, Patient Position: Sitting, Cuff Size: Normal)   Pulse 87   Temp (!) 97.5 F (36.4 C) (Oral)   Ht 5\' 10"  (1.778 m)   Wt 159 lb 6.4 oz (72.3 kg)   SpO2 99%   BMI 22.87 kg/m  General:   Alert and oriented. No distress noted. Pleasant and cooperative.  Head:  Normocephalic and atraumatic. Eyes:  Conjuctiva clear without scleral icterus. Heart:  S1, S2 present without murmurs appreciated. Lungs:  Clear to auscultation bilaterally. No wheezes, rales, or rhonchi. No distress.  Abdomen:  +BS, soft, non-tender and non-distended. No rebound or guarding. No HSM or masses noted. Msk:  Symmetrical without gross deformities. Normal posture. Extremities:  Without edema. Neurologic:  Alert and  oriented x4 Psych:  Normal mood and  affect.    Assessment:  72 y.o. male with history of CVA with residual left eye vision changes and left hand weakness, vascular dementia, chronic back/neck pain, HTN, DM, PVD, implanted reported history of unexplained strokes, polysubstance abuse, adenomatous colon polyps including TVA in 2007, currently overdue for surveillance, first-degree relatives with history of colon cancer, presenting today to discuss rescheduling colonoscopy due to poor prep.   Upon further discussion today, patient's poor prep was likely due to chronic constipation which was not reported at his last office visit.  For now, we will focus on gaining better management of his constipation.  Will start him on MiraLAX daily as he is not currently taking anything to help with bowel regularity.  Requested that he or his daughter-in-law call in 1-2 weeks to let me know if MiraLAX is not allowing him to have a productive bowel movement every day.  I will bring him back in about 6-8 weeks to follow-up on constipation and to discuss scheduling a colonoscopy.   Plan:  Start MiraLAX 17 g daily in 8 ounces of water or other noncarbonated beverage of his choice. Requested patient or daughter-in-law call in 1-2 weeks to let me know if MiraLAX is not allowing him to have productive bowel movements daily. Follow-up in 6-8 weeks.  Will discuss scheduling colonoscopy at that time.   Ermalinda Memos, PA-C [facility]astro[facility] 01/12/2023

## 2023-01-12 ENCOUNTER — Ambulatory Visit: Payer: Medicare HMO | Admitting: Gastroenterology

## 2023-01-12 ENCOUNTER — Encounter: Payer: Self-pay | Admitting: Gastroenterology

## 2023-01-12 VITALS — BP 156/76 | HR 87 | Temp 97.5°F | Ht 70.0 in | Wt 159.4 lb

## 2023-01-12 DIAGNOSIS — Z86010 Personal history of colonic polyps: Secondary | ICD-10-CM | POA: Diagnosis not present

## 2023-01-12 DIAGNOSIS — K5900 Constipation, unspecified: Secondary | ICD-10-CM | POA: Diagnosis not present

## 2023-01-12 MED ORDER — POLYETHYLENE GLYCOL 3350 17 GM/SCOOP PO POWD: 17.0000 g | Freq: Every day | ORAL | 3 refills | Status: DC

## 2023-01-12 NOTE — Patient Instructions (Addendum)
Start MiraLAX 17g daily in 8 oz of water or other non-carbonated beverage of your choice.   The goal is for you to have a productive bowel movement daily.  After 1-2 weeks, if your bowels not moving well, please let me know.  We will see back in about 6-8 weeks for follow-up and to hopefully discuss scheduling colonoscopy at that time.  Ermalinda Memos, PA-C [facility]astroenter[facility]

## 2023-01-13 NOTE — Progress Notes (Signed)
[facility] Summary Report / Loop Recorder 

## 2023-02-02 ENCOUNTER — Telehealth: Payer: Self-pay | Admitting: *Deleted

## 2023-02-02 ENCOUNTER — Ambulatory Visit: Payer: Medicare HMO

## 2023-02-02 DIAGNOSIS — I639 Cerebral infarction, unspecified: Secondary | ICD-10-CM | POA: Diagnosis not present

## 2023-02-02 LAB — CUP PACEART REMOTE DEVICE CHECK
Date Time Interrogation Session: 20240906230606
Implantable Pulse Generator Implant Date: 20211014

## 2023-02-02 NOTE — Telephone Encounter (Signed)
Pt's (DPR) called and states pt is still having problems having a bowel movement. She states he is taking MiraLax and his stool is soft, but he is going every 2 or 3 days.

## 2023-02-03 NOTE — Telephone Encounter (Signed)
We can try him on Linzess 145 mcg daily 30 minutes before breakfast. Do we have samples? If so, please provide 2 boxes and have them call next week with a progress report.    Patient needs to be made aware that he must take Linzess at least 30 minutes before breakfast and that it is common to initially have diarrhea, but it should taper off in the first couple of weeks.

## 2023-02-03 NOTE — Telephone Encounter (Signed)
Informed (DPR) of recommendations. She voiced understanding.

## 2023-02-05 ENCOUNTER — Telehealth: Payer: Self-pay | Admitting: Gastroenterology

## 2023-02-05 NOTE — Telephone Encounter (Signed)
Patient's daughter picked up the medication from front desk.  She asked if you could send thru MyChart the instructions for taking the medication so she can pass along to the nursing facility where the patient is at.

## 2023-02-09 ENCOUNTER — Encounter: Payer: Self-pay | Admitting: *Deleted

## 2023-02-09 NOTE — Telephone Encounter (Signed)
Noted. Sent her a MyChart message.

## 2023-02-12 ENCOUNTER — Other Ambulatory Visit: Payer: Self-pay | Admitting: Gastroenterology

## 2023-02-12 DIAGNOSIS — K5900 Constipation, unspecified: Secondary | ICD-10-CM

## 2023-02-13 NOTE — Telephone Encounter (Signed)
Can we find out if Linzess is working for this patient? We had recommended Linzess 145 in place of MiraLAX as MiraLAX wasn't controlling constipation.

## 2023-02-16 NOTE — Telephone Encounter (Signed)
Spoke to nurse at [facility] she informed me that pt is still taking MiraLax. Pt is not having regular bowel movements. She states they do not have a order for Linzess so he has not been taking it. They need any order to start the Linzess and they need an order to d/c the MiraLax. Fax # [phone]

## 2023-02-19 ENCOUNTER — Other Ambulatory Visit: Payer: Self-pay | Admitting: Gastroenterology

## 2023-02-19 DIAGNOSIS — K5900 Constipation, unspecified: Secondary | ICD-10-CM

## 2023-02-19 MED ORDER — LINACLOTIDE 145 MCG PO CAPS
145.0000 ug | ORAL_CAPSULE | Freq: Every day | ORAL | 3 refills | Status: DC
Start: 2023-02-19 — End: 2023-03-26

## 2023-02-19 NOTE — Telephone Encounter (Signed)
I have printed the d/c order for MiraLAX and the new Rx for Linzess. Please fax to [facility].

## 2023-02-19 NOTE — Telephone Encounter (Signed)
Faxed Linzess prescription and d/c order for MiraLax to [facility].

## 2023-02-19 NOTE — Progress Notes (Signed)
[facility] Summary Report / Loop Recorder 

## 2023-03-07 NOTE — Progress Notes (Unsigned)
Referring Provider: Leone Payor, FNP Primary Care Physician:  Leone Payor, FNP Primary GI Physician: Dr. Jena Gauss  No chief complaint on file.   HPI:   Ryan Butler is a 72 y.o. male  with history of CVA with residual left eye vision changes and left hand weakness, vascular dementia, chronic back/neck pain, HTN, DM, PVD, implanted reported history of unexplained strokes, polysubstance abuse, adenomatous colon polyps including TVA in 2007, first-degree relatives with history of colon cancer, presenting today to discuss rescheduling colonoscopy due to poor prep.   Colonoscopy attempted 12/31/2022 with procedure aborted due to poor prep.  Last seen in the office 01/12/2023 to discuss rescheduling colonoscopy.  He reported he followed the entire prep instructions aside from the enemas as he could not get them in without causing pain.  Reported chronic constipation with bowel movements every 2 to 3 days. No alarm symptoms.  Recommended starting MiraLAX daily, progress report in 1-2 weeks, follow-up in 6-8 weeks to discuss scheduling colonoscopy.  Progress report received 9/9 stating MiraLAX was not helping adequately.  Recommended trial of Linzess 145 mcg daily.  Today:       Colonoscopy in 2003 with 2 polyps removed.  Pathology revealed adenomatous polyp.  Colonoscopy in 2007 with 8 mm polyp in the descending colon removed.  Pathology with tubulovillous adenoma.  Pathology with tubular adenoma in 2010.  Endoscopy report not on file.  Past Medical History:  Diagnosis Date   Carotid artery disease (HCC)    Cervical spondylosis    Closed head injury 1996   Cognitive impairment    Essential hypertension, benign    History of colonic polyps    History of scarlet fever    History of seizures    unknown etiology and on no meds; last seizure was 2011   History of TIAs    Hyperlipidemia    Insomnia    Paranoia (HCC)    Polysubstance abuse (HCC)    History of cocaine in his 30's    Stroke (HCC) 12/2019   hx of strokes per pt, weakness of left side   Type 2 diabetes mellitus (HCC)    Pt stopped metformin 09/2013   Vision abnormalities    no peripheral vision in left eye    Past Surgical History:  Procedure Laterality Date   ANTERIOR CERVICAL DECOMP/DISCECTOMY FUSION N/A 12/05/2013   Procedure: ANTERIOR CERVICAL DECOMPRESSION/DISCECTOMY FUSION 2 LEVELS;  Surgeon: Mariam Dollar, MD;  Location: [facility] NEURO ORS;  Service: Neurosurgery;  Laterality: N/A;  Anterior Cervical Discectomy and Fusion with PEEK Cages and allograft Cervical Four-Five/Five-Six   BUBBLE STUDY  03/08/2020   Procedure: BUBBLE STUDY;  Surgeon: Vesta Mixer, MD;  Location: [facility] ENDOSCOPY;  Service: Cardiovascular;;   CATARACT EXTRACTION W/PHACO Left 05/24/2018   Procedure: CATARACT EXTRACTION PHACO AND INTRAOCULAR LENS PLACEMENT (IOC);  Surgeon: Gemma Payor, MD;  Location: [facility] ORS;  Service: Ophthalmology;  Laterality: Left;  CDE: 7.67   CATARACT EXTRACTION W/PHACO Right 11/21/2019   Procedure: CATARACT EXTRACTION PHACO AND INTRAOCULAR LENS PLACEMENT RIGHT EYE;  Surgeon: Fabio Pierce, MD;  Location: [facility] ORS;  Service: Ophthalmology;  Laterality: Right;  CDE: 6.26   COLONOSCOPY WITH PROPOFOL N/A 12/31/2022   Procedure: COLONOSCOPY WITH PROPOFOL;  Surgeon: Corbin Ade, MD;  Location: [facility] ENDO SUITE;  Service: Endoscopy;  Laterality: N/A;  945am, asa 3, group home resident   FRACTURE SURGERY     Hand laceration repair Left    Left carotid endarterectomy  6/06 - Dr. Edilia Bo   LOOP RECORDER INSERTION N/A 03/08/2020   Procedure: LOOP RECORDER INSERTION;  Surgeon: Hillis Range, MD;  [facility]: [facility] INVASIVE CV LAB;  Service: Cardiovascular;  Laterality: N/A;   skin graft to leg Left    TEE WITHOUT CARDIOVERSION N/A 03/08/2020   Procedure: TRANSESOPHAGEAL ECHOCARDIOGRAM (TEE);  Surgeon: Vesta Mixer, MD;  Location: [facility] ENDOSCOPY;  Service: Cardiovascular;  Laterality: N/A;  loop after   Tibia/fibula  fracture repair Left    VASCULAR SURGERY      Current Outpatient Medications  Medication Sig Dispense Refill   ACCU-CHEK AVIVA PLUS test strip USE ONE STRIP TO CHECK GLUCOSE TWICE DAILY 90 each 1   ACCU-CHEK SOFTCLIX LANCETS lancets USE ONE LANCET TWICE DAILY AS DIRECTED TO CHECK BLOOD GLUCOSE 90 each 1   acetaminophen (TYLENOL) 650 MG CR tablet Take by mouth 2 (two) times daily.     amitriptyline (ELAVIL) 150 MG tablet TAKE ONE TABLET BY MOUTH AT BEDTIME AS NEEDED FOR SLEEP 90 tablet 1   atorvastatin (LIPITOR) 80 MG tablet Take 1 tablet (80 mg total) by mouth daily at 6 PM. 30 tablet 1   clopidogrel (PLAVIX) 75 MG tablet Take 1 tablet (75 mg total) by mouth daily with breakfast. 30 tablet 11   glipiZIDE (GLUCOTROL XL) 5 MG 24 hr tablet Take 5 mg by mouth daily.     linaclotide (LINZESS) 145 MCG CAPS capsule Take 1 capsule (145 mcg total) by mouth daily before breakfast. 30 capsule 3   losartan (COZAAR) 100 MG tablet Take 100 mg by mouth daily.     melatonin 3 MG TABS tablet Take 3 mg by mouth at bedtime.     pantoprazole (PROTONIX) 40 MG tablet Take 40 mg by mouth daily.     risperiDONE (RISPERDAL) 2 MG tablet Take 2 mg by mouth 2 (two) times daily.     No current facility-administered medications for this visit.    Allergies as of 03/09/2023 - Review Complete 01/12/2023  Allergen Reaction Noted   Aspirin Nausea And Vomiting 01/08/2017   Hydrocodone Nausea Only 04/16/2015    Family History  Problem Relation Age of Onset   Coronary artery disease Mother    Diabetes Mother    Cancer Father        throat   Fibromyalgia Sister    Cancer Brother        colon   Cancer Brother        colon    Social History   Socioeconomic History   Marital status: Divorced    Spouse name: Not on file   Number of children: 2   Years of education: 9   Highest education level: 9th grade  Occupational History   Not on file  Tobacco Use   Smoking status: Every Day    Current packs/day: 0.25     Average packs/day: 0.5 packs/day for 63.2 years (30.8 ttl pk-yrs)    Types: Cigarettes    Start date: 12/17/1959   Smokeless tobacco: Never   Tobacco comments:    smokes 3/4 pack per day now 09/19/21  Vaping Use   Vaping status: Never Used  Substance and Sexual Activity   Alcohol use: Not Currently    Comment: seldom, 09/19/21 none, patient states last etoh a "right good while"   Drug use: Not Currently    Types: Marijuana, Oxycodone    Comment: 01/17/20 uses daily; 09/23/21 once a day   Sexual activity: Not Currently    Birth control/protection:  None  Other Topics Concern   Not on file  Social History Narrative   09/19/21 lives alone   Caffeine- heavy use   Social Determinants of Health   Financial Resource Strain: Not on file  Food Insecurity: Not on file  Transportation Needs: Not on file  Physical Activity: Not on file  Stress: Not on file  Social Connections: Not on file    Review of Systems: Gen: Denies fever, chills, anorexia. Denies fatigue, weakness, weight loss.  CV: Denies chest pain, palpitations, syncope, peripheral edema, and claudication. Resp: Denies dyspnea at rest, cough, wheezing, coughing up blood, and pleurisy. GI: Denies vomiting blood, jaundice, and fecal incontinence.   Denies dysphagia or odynophagia. Derm: Denies rash, itching, dry skin Psych: Denies depression, anxiety, memory loss, confusion. No homicidal or suicidal ideation.  Heme: Denies bruising, bleeding, and enlarged lymph nodes.  Physical Exam: There were no vitals taken for this visit. General:   Alert and oriented. No distress noted. Pleasant and cooperative.  Head:  Normocephalic and atraumatic. Eyes:  Conjuctiva clear without scleral icterus. Heart:  S1, S2 present without murmurs appreciated. Lungs:  Clear to auscultation bilaterally. No wheezes, rales, or rhonchi. No distress.  Abdomen:  +BS, soft, non-tender and non-distended. No rebound or guarding. No HSM or masses noted. Msk:   Symmetrical without gross deformities. Normal posture. Extremities:  Without edema. Neurologic:  Alert and  oriented x4 Psych:  Normal mood and affect.    Assessment:     Plan:  ***   Ermalinda Memos, PA-C [facility] 03/09/2023

## 2023-03-09 ENCOUNTER — Ambulatory Visit (INDEPENDENT_AMBULATORY_CARE_PROVIDER_SITE_OTHER): Payer: Medicare HMO | Admitting: Gastroenterology

## 2023-03-09 ENCOUNTER — Telehealth: Payer: Self-pay | Admitting: *Deleted

## 2023-03-09 ENCOUNTER — Encounter: Payer: Self-pay | Admitting: Gastroenterology

## 2023-03-09 ENCOUNTER — Ambulatory Visit (INDEPENDENT_AMBULATORY_CARE_PROVIDER_SITE_OTHER): Payer: Medicare HMO

## 2023-03-09 VITALS — BP 138/67 | HR 82 | Temp 98.1°F | Ht 60.0 in | Wt 158.6 lb

## 2023-03-09 DIAGNOSIS — K5909 Other constipation: Secondary | ICD-10-CM | POA: Diagnosis not present

## 2023-03-09 DIAGNOSIS — K5900 Constipation, unspecified: Secondary | ICD-10-CM

## 2023-03-09 DIAGNOSIS — I639 Cerebral infarction, unspecified: Secondary | ICD-10-CM | POA: Diagnosis not present

## 2023-03-09 DIAGNOSIS — Z860101 Personal history of adenomatous and serrated colon polyps: Secondary | ICD-10-CM | POA: Diagnosis not present

## 2023-03-09 DIAGNOSIS — Z860100 Personal history of colon polyps, unspecified: Secondary | ICD-10-CM | POA: Insufficient documentation

## 2023-03-09 LAB — CUP PACEART REMOTE DEVICE CHECK
Date Time Interrogation Session: 20241013230648
Implantable Pulse Generator Implant Date: 20211014

## 2023-03-09 NOTE — Telephone Encounter (Signed)
Was advised in office to call [facility] to schedule procedure.  Called daughter # and [email or web address] to see if she has a working # for them.

## 2023-03-09 NOTE — Patient Instructions (Addendum)
Continue taking Linzess 145 mcg daily 30 minutes before breakfast.  We will get you scheduled for a colonoscopy with Dr. Jena Gauss at [facility]  I will plan to see back in 6 months or sooner if needed.  It was good to see you again today!

## 2023-03-10 ENCOUNTER — Encounter: Payer: Self-pay | Admitting: *Deleted

## 2023-03-10 MED ORDER — PEG 3350-KCL-NA BICARB-NACL 420 G PO SOLR: 4000.0000 mL | Freq: Once | ORAL | 0 refills | Status: AC

## 2023-03-10 NOTE — Telephone Encounter (Signed)
PA approved via cohere. DOS:  04/29/2023 - 05/26/2023  Authorization #[id number]

## 2023-03-25 NOTE — Progress Notes (Signed)
[facility] Summary Report / Loop Recorder 

## 2023-03-26 ENCOUNTER — Other Ambulatory Visit: Payer: Self-pay | Admitting: Gastroenterology

## 2023-03-26 DIAGNOSIS — K5900 Constipation, unspecified: Secondary | ICD-10-CM

## 2023-04-02 DIAGNOSIS — Z125 Encounter for screening for malignant neoplasm of prostate: Secondary | ICD-10-CM | POA: Diagnosis not present

## 2023-04-02 DIAGNOSIS — N184 Chronic kidney disease, stage 4 (severe): Secondary | ICD-10-CM | POA: Diagnosis not present

## 2023-04-02 DIAGNOSIS — I10 Essential (primary) hypertension: Secondary | ICD-10-CM | POA: Diagnosis not present

## 2023-04-02 DIAGNOSIS — E782 Mixed hyperlipidemia: Secondary | ICD-10-CM | POA: Diagnosis not present

## 2023-04-06 DIAGNOSIS — Z0000 Encounter for general adult medical examination without abnormal findings: Secondary | ICD-10-CM | POA: Diagnosis not present

## 2023-04-06 DIAGNOSIS — E1122 Type 2 diabetes mellitus with diabetic chronic kidney disease: Secondary | ICD-10-CM | POA: Diagnosis not present

## 2023-04-06 DIAGNOSIS — K5900 Constipation, unspecified: Secondary | ICD-10-CM | POA: Diagnosis not present

## 2023-04-06 DIAGNOSIS — I129 Hypertensive chronic kidney disease with stage 1 through stage 4 chronic kidney disease, or unspecified chronic kidney disease: Secondary | ICD-10-CM | POA: Diagnosis not present

## 2023-04-06 DIAGNOSIS — N189 Chronic kidney disease, unspecified: Secondary | ICD-10-CM | POA: Diagnosis not present

## 2023-04-06 DIAGNOSIS — F17210 Nicotine dependence, cigarettes, uncomplicated: Secondary | ICD-10-CM | POA: Diagnosis not present

## 2023-04-06 DIAGNOSIS — G4700 Insomnia, unspecified: Secondary | ICD-10-CM | POA: Diagnosis not present

## 2023-04-06 DIAGNOSIS — E782 Mixed hyperlipidemia: Secondary | ICD-10-CM | POA: Diagnosis not present

## 2023-04-06 DIAGNOSIS — G8929 Other chronic pain: Secondary | ICD-10-CM | POA: Diagnosis not present

## 2023-04-13 ENCOUNTER — Ambulatory Visit (INDEPENDENT_AMBULATORY_CARE_PROVIDER_SITE_OTHER): Payer: Medicare HMO

## 2023-04-13 DIAGNOSIS — I639 Cerebral infarction, unspecified: Secondary | ICD-10-CM

## 2023-04-13 LAB — CUP PACEART REMOTE DEVICE CHECK
Date Time Interrogation Session: 20241117230651
Implantable Pulse Generator Implant Date: 20211014

## 2023-04-27 ENCOUNTER — Other Ambulatory Visit: Payer: Self-pay

## 2023-04-27 ENCOUNTER — Encounter (HOSPITAL_COMMUNITY): Payer: Self-pay

## 2023-04-27 ENCOUNTER — Encounter (HOSPITAL_COMMUNITY)
Admission: RE | Admit: 2023-04-27 | Discharge: 2023-04-27 | Disposition: A | Discharge status: Home / Self Care | Payer: Medicare HMO | Source: Ambulatory Visit | Attending: Internal Medicine | Admitting: Internal Medicine

## 2023-04-27 NOTE — Pre-Procedure Instructions (Signed)
Spoke with patients daughter-in-law about pre-op instructions. Her husband, Ines Bloomer is patients son and legal guardian. Patient resides at [facility] in [place]. They will bring legal guardian documents on arrival 12/4. She verbalizes understanding of instructions.

## 2023-04-28 ENCOUNTER — Encounter (HOSPITAL_COMMUNITY): Payer: Self-pay | Admitting: Anesthesiology

## 2023-04-28 NOTE — Anesthesia Preprocedure Evaluation (Signed)
Anesthesia Evaluation  Patient identified by MRN, date of birth, ID band Patient awake    Reviewed: Allergy & Precautions, H&P , NPO status , Patient's Chart, lab work & pertinent test results  Airway Mallampati: II  TM Distance: >3 FB Neck ROM: Full   Comment: ACDF Dental  (+) Edentulous Upper, Edentulous Lower   Pulmonary Current Smoker and Patient abstained from smoking.   Pulmonary exam normal breath sounds clear to auscultation       Cardiovascular Exercise Tolerance: Good hypertension, Pt. on medications + Peripheral Vascular Disease  Normal cardiovascular exam Rhythm:Regular Rate:Normal     Neuro/Psych  Headaches, Seizures -,  PSYCHIATRIC DISORDERS     Dementia Paranoia. Cognitive impairment.Carotid artery disease TIACVA, Residual Symptoms    GI/Hepatic negative GI ROS,,,(+)     substance abuse (last use of cocaine 30 years ago)  cocaine use and marijuana use  Endo/Other  diabetes, Well Controlled, Type 2, Oral Hypoglycemic Agents    Renal/GU negative Renal ROS  negative genitourinary   Musculoskeletal  (+) Arthritis , Osteoarthritis,    Abdominal   Peds negative pediatric ROS (+)  Hematology negative hematology ROS (+)   Anesthesia Other Findings   Reproductive/Obstetrics negative OB ROS                             Anesthesia Physical Anesthesia Plan  ASA: 3  Anesthesia Plan: General   Post-op Pain Management: Minimal or no pain anticipated   Induction: Intravenous  PONV Risk Score and Plan: 1 and Propofol infusion  Airway Management Planned: Nasal Cannula and Natural Airway  Additional Equipment: None  Intra-op Plan:   Post-operative Plan:   Informed Consent: I have reviewed the patients History and Physical, chart, labs and discussed the procedure including the risks, benefits and alternatives for the proposed anesthesia with the patient or authorized  representative who has indicated his/her understanding and acceptance.       Plan Discussed with: CRNA and Surgeon  Anesthesia Plan Comments:         Anesthesia Quick Evaluation

## 2023-04-29 ENCOUNTER — Encounter (HOSPITAL_COMMUNITY): Admission: RE | Payer: Self-pay | Source: Ambulatory Visit

## 2023-04-29 ENCOUNTER — Ambulatory Visit (HOSPITAL_COMMUNITY): Admission: RE | Admit: 2023-04-29 | Payer: Medicare HMO | Source: Ambulatory Visit | Admitting: Internal Medicine

## 2023-04-29 ENCOUNTER — Telehealth: Payer: Self-pay | Admitting: *Deleted

## 2023-04-29 DIAGNOSIS — E119 Type 2 diabetes mellitus without complications: Secondary | ICD-10-CM

## 2023-04-29 SURGERY — COLONOSCOPY WITH PROPOFOL
Anesthesia: Monitor Anesthesia Care

## 2023-04-29 NOTE — Telephone Encounter (Signed)
Pt's daughter in law Misty Stanley called and stated that the prep was not sent in for pt for his procedure this morning.  Advised her that prep was sent in on 03/10/23 to [facility] and it was confirmed by pharmacy. I asked if she wanted to reschedule him and she states that she will call back to reschedue.

## 2023-05-08 NOTE — Progress Notes (Signed)
[facility] Summary Report / Loop Recorder 

## 2023-05-18 ENCOUNTER — Ambulatory Visit (INDEPENDENT_AMBULATORY_CARE_PROVIDER_SITE_OTHER): Payer: Medicare HMO

## 2023-05-18 DIAGNOSIS — I639 Cerebral infarction, unspecified: Secondary | ICD-10-CM | POA: Diagnosis not present

## 2023-05-19 LAB — CUP PACEART REMOTE DEVICE CHECK
Date Time Interrogation Session: 20241222230047
Implantable Pulse Generator Implant Date: 20211014

## 2023-06-22 ENCOUNTER — Ambulatory Visit (INDEPENDENT_AMBULATORY_CARE_PROVIDER_SITE_OTHER): Payer: Medicare HMO

## 2023-06-22 DIAGNOSIS — I63011 Cerebral infarction due to thrombosis of right vertebral artery: Secondary | ICD-10-CM | POA: Diagnosis not present

## 2023-06-22 LAB — CUP PACEART REMOTE DEVICE CHECK
Date Time Interrogation Session: 20250126230555
Implantable Pulse Generator Implant Date: 20211014

## 2023-06-26 ENCOUNTER — Encounter: Payer: Self-pay | Admitting: Cardiovascular Disease

## 2023-06-29 NOTE — Addendum Note (Signed)
Addended by: Geralyn Flash D on: 06/29/2023 10:10 AM   Modules accepted: Orders

## 2023-06-29 NOTE — Progress Notes (Signed)
 Carelink Summary Report / Loop Recorder

## 2023-07-09 DIAGNOSIS — E782 Mixed hyperlipidemia: Secondary | ICD-10-CM | POA: Diagnosis not present

## 2023-07-09 DIAGNOSIS — E1122 Type 2 diabetes mellitus with diabetic chronic kidney disease: Secondary | ICD-10-CM | POA: Diagnosis not present

## 2023-07-22 DIAGNOSIS — Z8673 Personal history of transient ischemic attack (TIA), and cerebral infarction without residual deficits: Secondary | ICD-10-CM | POA: Diagnosis not present

## 2023-07-22 DIAGNOSIS — E782 Mixed hyperlipidemia: Secondary | ICD-10-CM | POA: Diagnosis not present

## 2023-07-22 DIAGNOSIS — G8929 Other chronic pain: Secondary | ICD-10-CM | POA: Diagnosis not present

## 2023-07-22 DIAGNOSIS — Z9189 Other specified personal risk factors, not elsewhere classified: Secondary | ICD-10-CM | POA: Diagnosis not present

## 2023-07-22 DIAGNOSIS — N184 Chronic kidney disease, stage 4 (severe): Secondary | ICD-10-CM | POA: Diagnosis not present

## 2023-07-22 DIAGNOSIS — I129 Hypertensive chronic kidney disease with stage 1 through stage 4 chronic kidney disease, or unspecified chronic kidney disease: Secondary | ICD-10-CM | POA: Diagnosis not present

## 2023-07-22 DIAGNOSIS — E1169 Type 2 diabetes mellitus with other specified complication: Secondary | ICD-10-CM | POA: Diagnosis not present

## 2023-07-22 DIAGNOSIS — G4700 Insomnia, unspecified: Secondary | ICD-10-CM | POA: Diagnosis not present

## 2023-07-22 DIAGNOSIS — K5900 Constipation, unspecified: Secondary | ICD-10-CM | POA: Diagnosis not present

## 2023-07-27 ENCOUNTER — Ambulatory Visit (INDEPENDENT_AMBULATORY_CARE_PROVIDER_SITE_OTHER): Payer: Medicare HMO

## 2023-07-27 DIAGNOSIS — I63011 Cerebral infarction due to thrombosis of right vertebral artery: Secondary | ICD-10-CM

## 2023-07-27 LAB — CUP PACEART REMOTE DEVICE CHECK
Date Time Interrogation Session: 20250302230418
Implantable Pulse Generator Implant Date: 20211014

## 2023-07-29 ENCOUNTER — Encounter: Payer: Self-pay | Admitting: Cardiovascular Disease

## 2023-08-03 NOTE — Progress Notes (Signed)
 Carelink Summary Report / Loop Recorder

## 2023-08-26 ENCOUNTER — Encounter: Payer: Self-pay | Admitting: Gastroenterology

## 2023-08-31 ENCOUNTER — Ambulatory Visit (INDEPENDENT_AMBULATORY_CARE_PROVIDER_SITE_OTHER): Payer: Medicare HMO

## 2023-08-31 DIAGNOSIS — I63011 Cerebral infarction due to thrombosis of right vertebral artery: Secondary | ICD-10-CM

## 2023-08-31 LAB — CUP PACEART REMOTE DEVICE CHECK
Date Time Interrogation Session: 20250406230312
Implantable Pulse Generator Implant Date: 20211014

## 2023-08-31 NOTE — Progress Notes (Signed)
 Carelink Summary Report / Loop Recorder

## 2023-09-06 ENCOUNTER — Encounter: Payer: Self-pay | Admitting: Cardiovascular Disease

## 2023-09-14 DIAGNOSIS — N3289 Other specified disorders of bladder: Secondary | ICD-10-CM | POA: Diagnosis not present

## 2023-09-14 DIAGNOSIS — G8929 Other chronic pain: Secondary | ICD-10-CM | POA: Diagnosis not present

## 2023-09-14 DIAGNOSIS — M48061 Spinal stenosis, lumbar region without neurogenic claudication: Secondary | ICD-10-CM | POA: Diagnosis not present

## 2023-09-14 DIAGNOSIS — M47816 Spondylosis without myelopathy or radiculopathy, lumbar region: Secondary | ICD-10-CM | POA: Diagnosis not present

## 2023-09-14 DIAGNOSIS — M549 Dorsalgia, unspecified: Secondary | ICD-10-CM | POA: Diagnosis not present

## 2023-09-14 DIAGNOSIS — R6889 Other general symptoms and signs: Secondary | ICD-10-CM | POA: Diagnosis not present

## 2023-09-14 DIAGNOSIS — Z743 Need for continuous supervision: Secondary | ICD-10-CM | POA: Diagnosis not present

## 2023-09-14 DIAGNOSIS — M79606 Pain in leg, unspecified: Secondary | ICD-10-CM | POA: Diagnosis not present

## 2023-09-14 DIAGNOSIS — M5126 Other intervertebral disc displacement, lumbar region: Secondary | ICD-10-CM | POA: Diagnosis not present

## 2023-09-14 DIAGNOSIS — M545 Low back pain, unspecified: Secondary | ICD-10-CM | POA: Diagnosis not present

## 2023-09-18 DIAGNOSIS — E782 Mixed hyperlipidemia: Secondary | ICD-10-CM | POA: Diagnosis not present

## 2023-09-18 DIAGNOSIS — I129 Hypertensive chronic kidney disease with stage 1 through stage 4 chronic kidney disease, or unspecified chronic kidney disease: Secondary | ICD-10-CM | POA: Diagnosis not present

## 2023-09-18 DIAGNOSIS — N189 Chronic kidney disease, unspecified: Secondary | ICD-10-CM | POA: Diagnosis not present

## 2023-09-18 DIAGNOSIS — G8929 Other chronic pain: Secondary | ICD-10-CM | POA: Diagnosis not present

## 2023-09-18 DIAGNOSIS — K219 Gastro-esophageal reflux disease without esophagitis: Secondary | ICD-10-CM | POA: Diagnosis not present

## 2023-10-05 ENCOUNTER — Ambulatory Visit (INDEPENDENT_AMBULATORY_CARE_PROVIDER_SITE_OTHER): Payer: Medicare HMO

## 2023-10-05 DIAGNOSIS — I63011 Cerebral infarction due to thrombosis of right vertebral artery: Secondary | ICD-10-CM | POA: Diagnosis not present

## 2023-10-06 LAB — CUP PACEART REMOTE DEVICE CHECK
Date Time Interrogation Session: 20250511233149
Implantable Pulse Generator Implant Date: 20211014

## 2023-10-09 ENCOUNTER — Ambulatory Visit: Payer: Self-pay | Admitting: Cardiovascular Disease

## 2023-10-21 NOTE — Progress Notes (Signed)
 Carelink Summary Report / Loop Recorder

## 2023-10-22 DIAGNOSIS — E1169 Type 2 diabetes mellitus with other specified complication: Secondary | ICD-10-CM | POA: Diagnosis not present

## 2023-10-22 DIAGNOSIS — E782 Mixed hyperlipidemia: Secondary | ICD-10-CM | POA: Diagnosis not present

## 2023-10-28 DIAGNOSIS — Z9189 Other specified personal risk factors, not elsewhere classified: Secondary | ICD-10-CM | POA: Diagnosis not present

## 2023-10-28 DIAGNOSIS — Z8673 Personal history of transient ischemic attack (TIA), and cerebral infarction without residual deficits: Secondary | ICD-10-CM | POA: Diagnosis not present

## 2023-10-28 DIAGNOSIS — I129 Hypertensive chronic kidney disease with stage 1 through stage 4 chronic kidney disease, or unspecified chronic kidney disease: Secondary | ICD-10-CM | POA: Diagnosis not present

## 2023-10-28 DIAGNOSIS — E1169 Type 2 diabetes mellitus with other specified complication: Secondary | ICD-10-CM | POA: Diagnosis not present

## 2023-10-28 DIAGNOSIS — G8929 Other chronic pain: Secondary | ICD-10-CM | POA: Diagnosis not present

## 2023-10-28 DIAGNOSIS — E782 Mixed hyperlipidemia: Secondary | ICD-10-CM | POA: Diagnosis not present

## 2023-10-28 DIAGNOSIS — E1122 Type 2 diabetes mellitus with diabetic chronic kidney disease: Secondary | ICD-10-CM | POA: Diagnosis not present

## 2023-10-28 DIAGNOSIS — N184 Chronic kidney disease, stage 4 (severe): Secondary | ICD-10-CM | POA: Diagnosis not present

## 2023-10-28 DIAGNOSIS — G4700 Insomnia, unspecified: Secondary | ICD-10-CM | POA: Diagnosis not present

## 2023-11-05 ENCOUNTER — Ambulatory Visit (INDEPENDENT_AMBULATORY_CARE_PROVIDER_SITE_OTHER)

## 2023-11-05 DIAGNOSIS — I63011 Cerebral infarction due to thrombosis of right vertebral artery: Secondary | ICD-10-CM | POA: Diagnosis not present

## 2023-11-05 LAB — CUP PACEART REMOTE DEVICE CHECK
Date Time Interrogation Session: 20250611230727
Implantable Pulse Generator Implant Date: 20211014

## 2023-11-07 ENCOUNTER — Ambulatory Visit: Payer: Self-pay | Admitting: Cardiovascular Disease

## 2023-11-09 ENCOUNTER — Ambulatory Visit: Payer: Medicare HMO

## 2023-11-18 ENCOUNTER — Other Ambulatory Visit: Payer: Self-pay | Admitting: Gastroenterology

## 2023-11-18 ENCOUNTER — Telehealth: Payer: Self-pay | Admitting: *Deleted

## 2023-11-18 DIAGNOSIS — F17210 Nicotine dependence, cigarettes, uncomplicated: Secondary | ICD-10-CM | POA: Diagnosis not present

## 2023-11-18 DIAGNOSIS — I10 Essential (primary) hypertension: Secondary | ICD-10-CM | POA: Diagnosis not present

## 2023-11-18 DIAGNOSIS — K5900 Constipation, unspecified: Secondary | ICD-10-CM

## 2023-11-18 DIAGNOSIS — S70261A Insect bite (nonvenomous), right hip, initial encounter: Secondary | ICD-10-CM | POA: Diagnosis not present

## 2023-11-18 DIAGNOSIS — W57XXXA Bitten or stung by nonvenomous insect and other nonvenomous arthropods, initial encounter: Secondary | ICD-10-CM | POA: Diagnosis not present

## 2023-11-18 MED ORDER — LINACLOTIDE 145 MCG PO CAPS
145.0000 ug | ORAL_CAPSULE | Freq: Every day | ORAL | 5 refills | Status: AC
Start: 2023-11-18 — End: ?

## 2023-11-18 NOTE — Telephone Encounter (Signed)
 Rx sent.

## 2023-11-18 NOTE — Telephone Encounter (Signed)
 Pt needs a refill on Linzess  sent to [facility]  Pt's last OV 03/07/2023

## 2023-11-24 NOTE — Progress Notes (Signed)
 Carelink Summary Report / Loop Recorder

## 2023-11-24 NOTE — Addendum Note (Signed)
 Addended by: TAWNI DRILLING D on: 11/24/2023 10:58 AM   Modules accepted: Orders

## 2023-11-30 DIAGNOSIS — F17210 Nicotine dependence, cigarettes, uncomplicated: Secondary | ICD-10-CM | POA: Diagnosis not present

## 2023-11-30 DIAGNOSIS — I10 Essential (primary) hypertension: Secondary | ICD-10-CM | POA: Diagnosis not present

## 2023-12-07 ENCOUNTER — Ambulatory Visit (INDEPENDENT_AMBULATORY_CARE_PROVIDER_SITE_OTHER)

## 2023-12-07 DIAGNOSIS — I63011 Cerebral infarction due to thrombosis of right vertebral artery: Secondary | ICD-10-CM

## 2023-12-08 LAB — CUP PACEART REMOTE DEVICE CHECK
Date Time Interrogation Session: 20250713231344
Implantable Pulse Generator Implant Date: 20211014

## 2023-12-14 ENCOUNTER — Ambulatory Visit: Payer: Medicare HMO

## 2023-12-16 ENCOUNTER — Ambulatory Visit: Payer: Self-pay | Admitting: Cardiovascular Disease

## 2023-12-21 DIAGNOSIS — Z79899 Other long term (current) drug therapy: Secondary | ICD-10-CM | POA: Diagnosis not present

## 2023-12-21 DIAGNOSIS — I10 Essential (primary) hypertension: Secondary | ICD-10-CM | POA: Diagnosis not present

## 2023-12-30 NOTE — Progress Notes (Signed)
 Carelink Summary Report / Loop Recorder

## 2024-01-07 ENCOUNTER — Ambulatory Visit (INDEPENDENT_AMBULATORY_CARE_PROVIDER_SITE_OTHER)

## 2024-01-07 DIAGNOSIS — I63011 Cerebral infarction due to thrombosis of right vertebral artery: Secondary | ICD-10-CM

## 2024-01-07 LAB — CUP PACEART REMOTE DEVICE CHECK
Date Time Interrogation Session: 20250813230457
Implantable Pulse Generator Implant Date: 20211014

## 2024-01-11 DIAGNOSIS — I10 Essential (primary) hypertension: Secondary | ICD-10-CM | POA: Diagnosis not present

## 2024-01-11 DIAGNOSIS — F17210 Nicotine dependence, cigarettes, uncomplicated: Secondary | ICD-10-CM | POA: Diagnosis not present

## 2024-01-14 ENCOUNTER — Ambulatory Visit: Payer: Self-pay | Admitting: Cardiovascular Disease

## 2024-01-18 ENCOUNTER — Ambulatory Visit: Payer: Medicare HMO

## 2024-02-01 DIAGNOSIS — Z79899 Other long term (current) drug therapy: Secondary | ICD-10-CM | POA: Diagnosis not present

## 2024-02-01 DIAGNOSIS — F17210 Nicotine dependence, cigarettes, uncomplicated: Secondary | ICD-10-CM | POA: Diagnosis not present

## 2024-02-01 DIAGNOSIS — I10 Essential (primary) hypertension: Secondary | ICD-10-CM | POA: Diagnosis not present

## 2024-02-08 ENCOUNTER — Ambulatory Visit (INDEPENDENT_AMBULATORY_CARE_PROVIDER_SITE_OTHER)

## 2024-02-08 DIAGNOSIS — I63011 Cerebral infarction due to thrombosis of right vertebral artery: Secondary | ICD-10-CM

## 2024-02-09 LAB — CUP PACEART REMOTE DEVICE CHECK
Date Time Interrogation Session: 20250913230851
Implantable Pulse Generator Implant Date: 20211014

## 2024-02-10 DIAGNOSIS — E782 Mixed hyperlipidemia: Secondary | ICD-10-CM | POA: Diagnosis not present

## 2024-02-10 DIAGNOSIS — E1169 Type 2 diabetes mellitus with other specified complication: Secondary | ICD-10-CM | POA: Diagnosis not present

## 2024-02-13 ENCOUNTER — Ambulatory Visit: Payer: Self-pay | Admitting: Cardiovascular Disease

## 2024-02-15 NOTE — Progress Notes (Signed)
 Remote Loop Recorder Transmission

## 2024-02-16 DIAGNOSIS — G8929 Other chronic pain: Secondary | ICD-10-CM | POA: Diagnosis not present

## 2024-02-16 DIAGNOSIS — E1169 Type 2 diabetes mellitus with other specified complication: Secondary | ICD-10-CM | POA: Diagnosis not present

## 2024-02-16 DIAGNOSIS — G4700 Insomnia, unspecified: Secondary | ICD-10-CM | POA: Diagnosis not present

## 2024-02-16 DIAGNOSIS — E782 Mixed hyperlipidemia: Secondary | ICD-10-CM | POA: Diagnosis not present

## 2024-02-16 DIAGNOSIS — N184 Chronic kidney disease, stage 4 (severe): Secondary | ICD-10-CM | POA: Diagnosis not present

## 2024-02-16 DIAGNOSIS — I129 Hypertensive chronic kidney disease with stage 1 through stage 4 chronic kidney disease, or unspecified chronic kidney disease: Secondary | ICD-10-CM | POA: Diagnosis not present

## 2024-02-16 DIAGNOSIS — E1165 Type 2 diabetes mellitus with hyperglycemia: Secondary | ICD-10-CM | POA: Diagnosis not present

## 2024-02-16 DIAGNOSIS — Z0001 Encounter for general adult medical examination with abnormal findings: Secondary | ICD-10-CM | POA: Diagnosis not present

## 2024-02-16 DIAGNOSIS — I10 Essential (primary) hypertension: Secondary | ICD-10-CM | POA: Diagnosis not present

## 2024-02-17 NOTE — Progress Notes (Signed)
 Remote Loop Recorder Transmission

## 2024-02-22 ENCOUNTER — Ambulatory Visit: Payer: Medicare HMO

## 2024-03-03 NOTE — Progress Notes (Signed)
 Remote Loop Recorder Transmission

## 2024-03-10 ENCOUNTER — Ambulatory Visit

## 2024-03-11 ENCOUNTER — Ambulatory Visit

## 2024-03-11 DIAGNOSIS — I63011 Cerebral infarction due to thrombosis of right vertebral artery: Secondary | ICD-10-CM

## 2024-03-13 LAB — CUP PACEART REMOTE DEVICE CHECK
Date Time Interrogation Session: 20251016230513
Implantable Pulse Generator Implant Date: 20211014

## 2024-03-15 NOTE — Progress Notes (Signed)
 Remote Loop Recorder Transmission

## 2024-03-21 ENCOUNTER — Ambulatory Visit: Payer: Self-pay | Admitting: Cardiovascular Disease

## 2024-03-28 ENCOUNTER — Ambulatory Visit: Payer: Medicare HMO

## 2024-04-11 ENCOUNTER — Ambulatory Visit (INDEPENDENT_AMBULATORY_CARE_PROVIDER_SITE_OTHER)

## 2024-04-11 ENCOUNTER — Ambulatory Visit

## 2024-04-11 DIAGNOSIS — I639 Cerebral infarction, unspecified: Secondary | ICD-10-CM | POA: Diagnosis not present

## 2024-04-11 LAB — CUP PACEART REMOTE DEVICE CHECK
Date Time Interrogation Session: 20251116230817
Implantable Pulse Generator Implant Date: 20211014

## 2024-04-13 NOTE — Progress Notes (Signed)
 Remote Loop Recorder Transmission

## 2024-04-25 ENCOUNTER — Ambulatory Visit: Payer: Self-pay | Admitting: Cardiovascular Disease

## 2024-05-02 ENCOUNTER — Ambulatory Visit: Payer: Medicare HMO

## 2024-05-12 ENCOUNTER — Ambulatory Visit

## 2024-05-12 DIAGNOSIS — I639 Cerebral infarction, unspecified: Secondary | ICD-10-CM | POA: Diagnosis not present

## 2024-05-12 LAB — CUP PACEART REMOTE DEVICE CHECK
Date Time Interrogation Session: 20251217230508
Implantable Pulse Generator Implant Date: 20211014

## 2024-05-13 NOTE — Progress Notes (Signed)
 Remote Loop Recorder Transmission

## 2024-05-25 ENCOUNTER — Ambulatory Visit: Payer: Self-pay | Admitting: Cardiovascular Disease

## 2024-06-06 ENCOUNTER — Ambulatory Visit: Payer: Medicare HMO

## 2024-06-12 ENCOUNTER — Ambulatory Visit

## 2024-06-12 DIAGNOSIS — I639 Cerebral infarction, unspecified: Secondary | ICD-10-CM | POA: Diagnosis not present

## 2024-06-13 LAB — CUP PACEART REMOTE DEVICE CHECK
Date Time Interrogation Session: 20260117230431
Implantable Pulse Generator Implant Date: 20211014

## 2024-06-14 NOTE — Progress Notes (Signed)
 Remote Loop Recorder Transmission

## 2024-06-21 ENCOUNTER — Ambulatory Visit: Payer: Self-pay | Admitting: Cardiovascular Disease

## 2024-07-13 ENCOUNTER — Ambulatory Visit

## 2024-08-13 ENCOUNTER — Ambulatory Visit
